# Patient Record
Sex: Male | Born: 1945
Health system: Southern US, Community
[De-identification: ages and names within clinical notes are randomized; demographics above are authoritative.]

## PROBLEM LIST (undated history)

## (undated) DIAGNOSIS — J189 Pneumonia, unspecified organism: Secondary | ICD-10-CM

## (undated) DIAGNOSIS — I10 Essential (primary) hypertension: Secondary | ICD-10-CM

## (undated) DIAGNOSIS — M1990 Unspecified osteoarthritis, unspecified site: Secondary | ICD-10-CM

## (undated) DIAGNOSIS — R112 Nausea with vomiting, unspecified: Secondary | ICD-10-CM

## (undated) DIAGNOSIS — E785 Hyperlipidemia, unspecified: Secondary | ICD-10-CM

## (undated) DIAGNOSIS — R42 Dizziness and giddiness: Secondary | ICD-10-CM

## (undated) DIAGNOSIS — T7840XA Allergy, unspecified, initial encounter: Secondary | ICD-10-CM

## (undated) DIAGNOSIS — K5790 Diverticulosis of intestine, part unspecified, without perforation or abscess without bleeding: Secondary | ICD-10-CM

## (undated) DIAGNOSIS — R011 Cardiac murmur, unspecified: Secondary | ICD-10-CM

## (undated) DIAGNOSIS — K219 Gastro-esophageal reflux disease without esophagitis: Secondary | ICD-10-CM

## (undated) DIAGNOSIS — Z8719 Personal history of other diseases of the digestive system: Secondary | ICD-10-CM

## (undated) DIAGNOSIS — K589 Irritable bowel syndrome without diarrhea: Secondary | ICD-10-CM

## (undated) DIAGNOSIS — I499 Cardiac arrhythmia, unspecified: Secondary | ICD-10-CM

## (undated) DIAGNOSIS — Z9889 Other specified postprocedural states: Secondary | ICD-10-CM

## (undated) DIAGNOSIS — H269 Unspecified cataract: Secondary | ICD-10-CM

## (undated) DIAGNOSIS — G4730 Sleep apnea, unspecified: Secondary | ICD-10-CM

## (undated) HISTORY — DX: Cardiac arrhythmia, unspecified: I49.9

## (undated) HISTORY — PX: CARPAL TUNNEL RELEASE: SHX101

## (undated) HISTORY — PX: UPPER GASTROINTESTINAL ENDOSCOPY: SHX188

## (undated) HISTORY — PX: EYE SURGERY: SHX253

## (undated) HISTORY — DX: Diverticulosis of intestine, part unspecified, without perforation or abscess without bleeding: K57.90

## (undated) HISTORY — DX: Essential (primary) hypertension: I10

## (undated) HISTORY — DX: Allergy, unspecified, initial encounter: T78.40XA

## (undated) HISTORY — PX: LUMBAR DISC SURGERY: SHX700

## (undated) HISTORY — PX: OTHER SURGICAL HISTORY: SHX169

## (undated) HISTORY — DX: Cardiac murmur, unspecified: R01.1

## (undated) HISTORY — DX: Unspecified cataract: H26.9

## (undated) HISTORY — PX: ESOPHAGOGASTRODUODENOSCOPY: SHX1529

## (undated) HISTORY — DX: Dizziness and giddiness: R42

## (undated) HISTORY — PX: JOINT REPLACEMENT: SHX530

## (undated) HISTORY — DX: Sleep apnea, unspecified: G47.30

## (undated) HISTORY — PX: COLONOSCOPY: SHX174

## (undated) HISTORY — DX: Irritable bowel syndrome, unspecified: K58.9

## (undated) HISTORY — DX: Hyperlipidemia, unspecified: E78.5

---

## 1978-05-17 HISTORY — PX: VASECTOMY: SHX75

## 1999-10-21 ENCOUNTER — Encounter: Payer: Self-pay | Admitting: Emergency Medicine

## 1999-10-21 ENCOUNTER — Emergency Department (HOSPITAL_COMMUNITY): Admission: EM | Admit: 1999-10-21 | Discharge: 1999-10-21 | Payer: Self-pay | Admitting: Emergency Medicine

## 2002-05-17 HISTORY — PX: CHOLECYSTECTOMY: SHX55

## 2004-05-17 HISTORY — PX: FRACTURE SURGERY: SHX138

## 2009-05-17 HISTORY — PX: SHOULDER SURGERY: SHX246

## 2012-01-28 DIAGNOSIS — M12269 Villonodular synovitis (pigmented), unspecified knee: Secondary | ICD-10-CM | POA: Diagnosis not present

## 2012-01-28 DIAGNOSIS — G5600 Carpal tunnel syndrome, unspecified upper limb: Secondary | ICD-10-CM | POA: Diagnosis not present

## 2012-01-28 DIAGNOSIS — M9420 Chondromalacia, unspecified site: Secondary | ICD-10-CM | POA: Diagnosis not present

## 2012-01-28 DIAGNOSIS — M7650 Patellar tendinitis, unspecified knee: Secondary | ICD-10-CM | POA: Diagnosis not present

## 2012-02-18 DIAGNOSIS — G5600 Carpal tunnel syndrome, unspecified upper limb: Secondary | ICD-10-CM | POA: Diagnosis not present

## 2012-04-04 DIAGNOSIS — Z23 Encounter for immunization: Secondary | ICD-10-CM | POA: Diagnosis not present

## 2012-04-04 DIAGNOSIS — K219 Gastro-esophageal reflux disease without esophagitis: Secondary | ICD-10-CM | POA: Diagnosis not present

## 2012-04-04 DIAGNOSIS — R1084 Generalized abdominal pain: Secondary | ICD-10-CM | POA: Diagnosis not present

## 2012-04-04 DIAGNOSIS — Z79899 Other long term (current) drug therapy: Secondary | ICD-10-CM | POA: Diagnosis not present

## 2012-04-04 DIAGNOSIS — E782 Mixed hyperlipidemia: Secondary | ICD-10-CM | POA: Diagnosis not present

## 2012-04-04 DIAGNOSIS — Z125 Encounter for screening for malignant neoplasm of prostate: Secondary | ICD-10-CM | POA: Diagnosis not present

## 2012-04-20 DIAGNOSIS — F411 Generalized anxiety disorder: Secondary | ICD-10-CM | POA: Diagnosis not present

## 2012-04-20 DIAGNOSIS — G5600 Carpal tunnel syndrome, unspecified upper limb: Secondary | ICD-10-CM | POA: Diagnosis not present

## 2012-04-20 DIAGNOSIS — G4730 Sleep apnea, unspecified: Secondary | ICD-10-CM | POA: Diagnosis not present

## 2012-04-20 DIAGNOSIS — R072 Precordial pain: Secondary | ICD-10-CM | POA: Diagnosis not present

## 2012-04-20 DIAGNOSIS — G4733 Obstructive sleep apnea (adult) (pediatric): Secondary | ICD-10-CM | POA: Diagnosis not present

## 2012-04-20 DIAGNOSIS — E669 Obesity, unspecified: Secondary | ICD-10-CM | POA: Diagnosis not present

## 2012-05-17 DIAGNOSIS — K449 Diaphragmatic hernia without obstruction or gangrene: Secondary | ICD-10-CM

## 2012-05-17 HISTORY — DX: Diaphragmatic hernia without obstruction or gangrene: K44.9

## 2012-05-26 DIAGNOSIS — R198 Other specified symptoms and signs involving the digestive system and abdomen: Secondary | ICD-10-CM | POA: Diagnosis not present

## 2012-05-26 DIAGNOSIS — K5900 Constipation, unspecified: Secondary | ICD-10-CM | POA: Diagnosis not present

## 2012-05-26 DIAGNOSIS — K219 Gastro-esophageal reflux disease without esophagitis: Secondary | ICD-10-CM | POA: Diagnosis not present

## 2012-05-26 DIAGNOSIS — R1032 Left lower quadrant pain: Secondary | ICD-10-CM | POA: Diagnosis not present

## 2012-05-26 DIAGNOSIS — R109 Unspecified abdominal pain: Secondary | ICD-10-CM | POA: Diagnosis not present

## 2012-06-12 DIAGNOSIS — K2100 Gastro-esophageal reflux disease with esophagitis, without bleeding: Secondary | ICD-10-CM | POA: Diagnosis not present

## 2012-06-12 DIAGNOSIS — R198 Other specified symptoms and signs involving the digestive system and abdomen: Secondary | ICD-10-CM | POA: Diagnosis not present

## 2012-06-12 DIAGNOSIS — K449 Diaphragmatic hernia without obstruction or gangrene: Secondary | ICD-10-CM | POA: Diagnosis not present

## 2012-06-12 DIAGNOSIS — R1032 Left lower quadrant pain: Secondary | ICD-10-CM | POA: Diagnosis not present

## 2012-06-12 DIAGNOSIS — K219 Gastro-esophageal reflux disease without esophagitis: Secondary | ICD-10-CM | POA: Diagnosis not present

## 2012-07-24 DIAGNOSIS — K2100 Gastro-esophageal reflux disease with esophagitis, without bleeding: Secondary | ICD-10-CM | POA: Diagnosis not present

## 2012-07-24 DIAGNOSIS — R198 Other specified symptoms and signs involving the digestive system and abdomen: Secondary | ICD-10-CM | POA: Diagnosis not present

## 2012-07-24 DIAGNOSIS — K589 Irritable bowel syndrome without diarrhea: Secondary | ICD-10-CM | POA: Diagnosis not present

## 2012-09-29 DIAGNOSIS — G5600 Carpal tunnel syndrome, unspecified upper limb: Secondary | ICD-10-CM | POA: Diagnosis not present

## 2012-10-02 DIAGNOSIS — R197 Diarrhea, unspecified: Secondary | ICD-10-CM | POA: Diagnosis not present

## 2012-10-02 DIAGNOSIS — E782 Mixed hyperlipidemia: Secondary | ICD-10-CM | POA: Diagnosis not present

## 2012-10-02 DIAGNOSIS — K219 Gastro-esophageal reflux disease without esophagitis: Secondary | ICD-10-CM | POA: Diagnosis not present

## 2012-10-02 DIAGNOSIS — Z79899 Other long term (current) drug therapy: Secondary | ICD-10-CM | POA: Diagnosis not present

## 2012-11-13 DIAGNOSIS — Z01810 Encounter for preprocedural cardiovascular examination: Secondary | ICD-10-CM | POA: Diagnosis not present

## 2012-11-13 DIAGNOSIS — G5600 Carpal tunnel syndrome, unspecified upper limb: Secondary | ICD-10-CM | POA: Diagnosis not present

## 2012-11-13 DIAGNOSIS — E785 Hyperlipidemia, unspecified: Secondary | ICD-10-CM | POA: Diagnosis not present

## 2012-11-13 DIAGNOSIS — Z87891 Personal history of nicotine dependence: Secondary | ICD-10-CM | POA: Diagnosis not present

## 2013-01-05 DIAGNOSIS — G4733 Obstructive sleep apnea (adult) (pediatric): Secondary | ICD-10-CM | POA: Diagnosis not present

## 2013-02-07 DIAGNOSIS — E782 Mixed hyperlipidemia: Secondary | ICD-10-CM | POA: Diagnosis not present

## 2013-02-07 DIAGNOSIS — K219 Gastro-esophageal reflux disease without esophagitis: Secondary | ICD-10-CM | POA: Diagnosis not present

## 2013-02-07 DIAGNOSIS — Z79899 Other long term (current) drug therapy: Secondary | ICD-10-CM | POA: Diagnosis not present

## 2013-02-07 DIAGNOSIS — R197 Diarrhea, unspecified: Secondary | ICD-10-CM | POA: Diagnosis not present

## 2013-04-11 ENCOUNTER — Encounter: Payer: Self-pay | Admitting: Family Medicine

## 2013-04-11 ENCOUNTER — Ambulatory Visit (INDEPENDENT_AMBULATORY_CARE_PROVIDER_SITE_OTHER): Payer: Medicare Other | Admitting: Family Medicine

## 2013-04-11 VITALS — BP 120/60 | HR 78 | Temp 97.6°F | Resp 18 | Ht 68.0 in | Wt 195.5 lb

## 2013-04-11 DIAGNOSIS — Z0000 Encounter for general adult medical examination without abnormal findings: Secondary | ICD-10-CM | POA: Diagnosis not present

## 2013-04-11 DIAGNOSIS — G8929 Other chronic pain: Secondary | ICD-10-CM

## 2013-04-11 DIAGNOSIS — M25511 Pain in right shoulder: Secondary | ICD-10-CM

## 2013-04-11 DIAGNOSIS — E781 Pure hyperglyceridemia: Secondary | ICD-10-CM | POA: Diagnosis not present

## 2013-04-11 DIAGNOSIS — G4733 Obstructive sleep apnea (adult) (pediatric): Secondary | ICD-10-CM

## 2013-04-11 DIAGNOSIS — K219 Gastro-esophageal reflux disease without esophagitis: Secondary | ICD-10-CM

## 2013-04-11 DIAGNOSIS — M25519 Pain in unspecified shoulder: Secondary | ICD-10-CM

## 2013-04-11 NOTE — Progress Notes (Signed)
Subjective:    Patient ID: Brett [name], male    DOB: [birth year], 67 y.o.   MRN: [id number]  HPI   Subjective:   Patient presents for Medicare Annual/Subsequent preventive examination.    Patient here to establish care and for physical exam. He recently moved to [facility] from [place]. He is here with his wife in some family members. He has no specific concerns today. His history and medications were reviewed.  Hyper lipidemia. Mostly elevated triglycerides. He's taken pravastatin which he's been on for the past 2 years. He refuses to change any other medications. His cholesterol was checked back in may of 2014 and labs have been scanned into the chart.   Obstructive sleep apnea he is currently on CPAP he has been on this for greater than 5 years   He did have an abnormal chest x-ray which showed a small spot on his lungs which is been followed with chest x-rays every couple of years. He quit tobacco back in 1989    He is on the Colestid pulled secondary to transient bile problems from his gallbladder which is now status post cholecystectomy. He does well on the current dose   He has history of chronic right shoulder pain he's been evaluated by orthopedics in the past. At this time he is maintained on low-dose diclofenac. It is noted that he does have GERD but he takes Prilosec   In the past he was on fluoxetine but this was accommodation with his gastroenterologist was not given secondary to mood disorder appear    He is establish will establish with an eye doctor and dentist. He was told that he had early cataracts appear  Review Past Medical/Family/Social: EMR   Risk Factors  Current exercise habits: Some exercise Dietary issues discussed: yes  Cardiac risk factors: Hyperlipidemia  Depression Screen  (Note: if answer to either of the following is "Yes", a more complete depression screening is indicated)  Over the past two weeks, have you felt down, depressed or hopeless?  No Over the past two weeks, have you felt little interest or pleasure in doing things? No Have you lost interest or pleasure in daily life? No Do you often feel hopeless? No Do you cry easily over simple problems? No   Activities of Daily Living  In your present state of health, do you have any difficulty performing the following activities?:  Driving? No  Managing money? No  Feeding yourself? No  Getting from bed to chair? No  Climbing a flight of stairs? No  Preparing food and eating?: No  Bathing or showering? No  Getting dressed: No  Getting to the toilet? No  Using the toilet:No  Moving around from place to place: No  In the past year have you fallen or had a near fall?:No  Are you sexually active? No  Do you have more than one partner? No   Hearing Difficulties: No  Do you often ask people to speak up or repeat themselves? No  Do you experience ringing or noises in your ears? No Do you have difficulty understanding soft or whispered voices? No  Do you feel that you have a problem with memory? No Do you often misplace items? No  Do you feel safe at home? Yes  Cognitive Testing  Alert? Yes Normal Appearance?Yes  Oriented to person? Yes Place? Yes  Time? Yes  Recall of three objects? Yes  Can perform simple calculations? Yes  Displays appropriate judgment?Yes  Can read the  correct time from a watch face?Yes     Screening Tests / Date Colonoscopy  - 06/12/12                   Zostavax - Done Influenza Vaccine - dECLLINES pNEUMOVAX- DECLINES Tetanus/tdap- UTD    Assessment:    Annual wellness medicare exam   Plan:    During the course of the visit the patient was educated and counseled about appropriate screening and preventive services including:  Pneumonia vaccine- declines, flu declines  Screen Neg for depression.   Diet review for nutrition referral? Yes ____ Not Indicated __x__  Patient Instructions (the written plan) was given to the patient.    Medicare Attestation  I have personally reviewed:  The patient's medical and social history  Their use of alcohol, tobacco or illicit drugs  Their current medications and supplements  The patient's functional ability including ADLs,fall risks, home safety risks, cognitive, and hearing and visual impairment  Diet and physical activities  Evidence for depression or mood disorders  The patient's weight, height, BMI, and visual acuity have been recorded in the chart. I have made referrals, counseling, and provided education to the patient based on review of the above and I have provided the patient with a written personalized care plan for preventive services.        Review of Systems  GEN- denies fatigue, fever, weight loss,weakness, recent illness HEENT- denies eye drainage, change in vision, nasal discharge, CVS- denies chest pain, palpitations RESP- denies SOB, cough, wheeze ABD- denies N/V, change in stools, abd pain GU- denies dysuria, hematuria, dribbling, incontinence MSK- + joint pain, muscle aches, injury Neuro- denies headache, dizziness, syncope, seizure activity      Objective:   Physical Exam GEN- NAD, alert and oriented x3 HEENT- PERRL, EOMI, non injected sclera, pink conjunctiva, MMM, oropharynx clear Neck- Supple, no thryomegaly CVS- RRR, no murmur RESP-CTAB ABD-NABS,soft,nt,nd EXT- No edema Pulses- Radial, DP- 2+ Psych- normal affect and mood       Assessment & Plan:

## 2013-04-11 NOTE — Patient Instructions (Signed)
Release of records - form PCP  Continue current medications F/U 6 months- fasting labs

## 2013-04-13 ENCOUNTER — Encounter: Payer: Self-pay | Admitting: Family Medicine

## 2013-04-13 DIAGNOSIS — G4733 Obstructive sleep apnea (adult) (pediatric): Secondary | ICD-10-CM | POA: Insufficient documentation

## 2013-04-13 DIAGNOSIS — E782 Mixed hyperlipidemia: Secondary | ICD-10-CM | POA: Insufficient documentation

## 2013-04-13 DIAGNOSIS — K219 Gastro-esophageal reflux disease without esophagitis: Secondary | ICD-10-CM | POA: Insufficient documentation

## 2013-04-13 DIAGNOSIS — G8929 Other chronic pain: Secondary | ICD-10-CM | POA: Insufficient documentation

## 2013-04-13 MED ORDER — DICLOFENAC SODIUM 75 MG PO TBEC: 75.0000 mg | DELAYED_RELEASE_TABLET | Freq: Two times a day (BID) | ORAL | Status: DC

## 2013-04-13 MED ORDER — PRAVASTATIN SODIUM 40 MG PO TABS: 40.0000 mg | ORAL_TABLET | Freq: Every day | ORAL | Status: DC

## 2013-04-13 MED ORDER — OMEPRAZOLE 20 MG PO CPDR: 20.0000 mg | DELAYED_RELEASE_CAPSULE | Freq: Every day | ORAL | Status: DC

## 2013-04-13 NOTE — Assessment & Plan Note (Signed)
He will continue his NSAIDs. We'll have to watch his renal function as he is on this chronically. He does have Gaster esophageal reflux however he wants to continue with the medication

## 2013-04-13 NOTE — Assessment & Plan Note (Signed)
Continue current medications he does not want to change his medication any further

## 2013-04-13 NOTE — Assessment & Plan Note (Signed)
Continue Prilosec

## 2013-05-23 ENCOUNTER — Encounter: Payer: Self-pay | Admitting: Physician Assistant

## 2013-05-23 ENCOUNTER — Ambulatory Visit (INDEPENDENT_AMBULATORY_CARE_PROVIDER_SITE_OTHER): Payer: Medicare Other | Admitting: Physician Assistant

## 2013-05-23 VITALS — BP 142/86 | HR 68 | Temp 97.5°F | Resp 18 | Wt 198.0 lb

## 2013-05-23 DIAGNOSIS — M779 Enthesopathy, unspecified: Secondary | ICD-10-CM | POA: Diagnosis not present

## 2013-05-23 MED ORDER — PREDNISONE 20 MG PO TABS: ORAL_TABLET | ORAL | Status: DC

## 2013-05-23 NOTE — Progress Notes (Signed)
Patient ID: Brett [name] MRN: [id number], DOB: 1946/04/24, 68 y.o. Date of Encounter: 05/23/2013, 2:55 PM    Chief Complaint:  Chief Complaint  Patient presents with  . rt wrist injury 2 weeks ago  still painful     HPI: 68 y.o. year old white male is here with his wife. 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 However, he does work on the equipment. He says that he has noticed pain at the ulnar aspect of his right wrist with certain movements over the past 2 weeks. It is when that area is stretched, that he feels the pain there. I.e. when he adducts his wrist.  Says he had no trauma or injury. Had no sudden onset of pain. Says that when he pulls the tailgate of his truck closed, this causes pain in this area.  He says that he takes anti-inflammatory twice a day on a routine basis secondary to problems with his neck. Says this is not helping  He has not rested the wrist at all. He has continued to do repetitive motion/ working on farm equipment on a routine basis.     Home Meds: See attached medication section for any medications that were entered at today's visit. The computer does not put those onto this list.The following list is a list of meds entered prior to today's visit.   Current Outpatient Prescriptions on File Prior to Visit  Medication Sig Dispense Refill  . colestipol (COLESTID) 1 G tablet Take 1 g by mouth 2 (two) times daily.      . diclofenac (VOLTAREN) 75 MG EC tablet Take 1 tablet (75 mg total) by mouth 2 (two) times daily.  180 tablet  3  . omeprazole (PRILOSEC) 20 MG capsule Take 1 capsule (20 mg total) by mouth daily.  90 capsule  3  . pravastatin (PRAVACHOL) 40 MG tablet Take 1 tablet (40 mg total) by mouth daily.  90 tablet  3   No current facility-administered medications on file prior to visit.    Allergies: No Known Allergies    Review of Systems: See HPI for pertinent ROS. All other ROS negative.     Physical Exam: Blood pressure 142/86, pulse 68, temperature 97.5 F (36.4 C), temperature source Oral, resp. rate 18, weight 198 lb (89.812 kg)., Body mass index is 30.11 kg/(m^2). General: WNWD WM. Appears in no acute distress. Lungs: Clear bilaterally to auscultation without wheezes, rales, or rhonchi. Breathing is unlabored. Heart: Regular rhythm. No murmurs, rubs, or gallops. Msk:  Strength and tone normal for age.  Right Wrist: Inspection is normal. There is no ecchymosis and no erythema. Very minimal swelling of the right hand compared to the left. No localized swelling at the wrist.  He has full range of motion of the wrist. When he adducts, this causes mild pain at the ulnar aspect/lateral side of the wrist. Also, when he fully flexes at the wrist, this also causes some pain at the ulnar lateral aspect of the wrist.  Neuro: Alert and oriented X 3. Moves all extremities spontaneously. Gait is normal. CNII-XII grossly in tact. Psych:  Responds to questions appropriately with a normal affect.     ASSESSMENT AND PLAN:  68 y.o. year old male with  1. Tendonitis Discussed that he needs to rest to the area. He does not currently have a wrist splint. I put a wrist splint on him today. Discussed the need to keep  the wrist in neutral position and to avoid repetitive motion for 2 weeks. Also give prednisone to decrease inflammation. If symptoms not improved/resolved in 2 weeks, then followup.  - predniSONE (DELTASONE) 20 MG tablet; Take 3 daily for 2 days, then 2 daily for 2 days, then 1 daily for 2 days.  Dispense: 12 tablet; Refill: 0   Signed, [address] [place], [place], [facility] 05/23/2013 2:55 PM

## 2013-08-24 ENCOUNTER — Encounter: Payer: Self-pay | Admitting: Family Medicine

## 2013-08-24 ENCOUNTER — Ambulatory Visit (INDEPENDENT_AMBULATORY_CARE_PROVIDER_SITE_OTHER): Payer: Medicare Other | Admitting: Family Medicine

## 2013-08-24 VITALS — BP 134/86 | HR 62 | Temp 97.3°F | Resp 14 | Ht 69.0 in | Wt 192.0 lb

## 2013-08-24 DIAGNOSIS — W57XXXA Bitten or stung by nonvenomous insect and other nonvenomous arthropods, initial encounter: Secondary | ICD-10-CM

## 2013-08-24 DIAGNOSIS — T148 Other injury of unspecified body region: Secondary | ICD-10-CM

## 2013-08-24 DIAGNOSIS — R7309 Other abnormal glucose: Secondary | ICD-10-CM

## 2013-08-24 DIAGNOSIS — E785 Hyperlipidemia, unspecified: Secondary | ICD-10-CM | POA: Diagnosis not present

## 2013-08-24 DIAGNOSIS — Z125 Encounter for screening for malignant neoplasm of prostate: Secondary | ICD-10-CM | POA: Diagnosis not present

## 2013-08-24 DIAGNOSIS — R7302 Impaired glucose tolerance (oral): Secondary | ICD-10-CM

## 2013-08-24 MED ORDER — DOXYCYCLINE HYCLATE 100 MG PO TABS: 100.0000 mg | ORAL_TABLET | Freq: Two times a day (BID) | ORAL | Status: DC

## 2013-08-24 NOTE — Progress Notes (Signed)
Patient ID: Brett [name], male   DOB: 30-Jul-1945, 68 y.o.   MRN: [id number]   Subjective:    Patient ID: Brett [name], male    DOB: [birth year], 68 y.o.   MRN: [id number]  Patient presents for Tick Bite  patient here with multiple tick bites. 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 He is white however concerned about one on his left lower flank which is been red and now hardened. He's not had any joint pain has not had any fever does not have any abnormal myalgias. He's not had any drainage from the spot. He did actually removed the tick.    Review Of Systems:  GEN- denies fatigue, fever, weight loss,weakness, recent illness HEENT- denies eye drainage, change in vision, nasal discharge, CVS- denies chest pain, palpitations RESP- denies SOB, cough, wheeze ABD- denies N/V, change in stools, abd pain MSK- denies joint pain, muscle aches, injury Neuro- denies headache, dizziness, syncope, seizure activity       Objective:    BP 134/86  Pulse 62  Temp(Src) 97.3 F (36.3 C) (Oral)  Resp 14  Ht 5\' 9"  (1.753 m)  Wt 192 lb (87.091 kg)  BMI 28.34 kg/m2 GEN- NAD, alert and oriented x3 Skin- Left flank- quarter size erythema, with clearing in the middle, +induration ,NT, no fluctuance, scab right thigh, left leg, small erythematous macule at previous tick bite         Assessment & Plan:      Problem List Items Addressed This Visit   None    Visit Diagnoses   Tick bite    -  Primary    Multiple tick bites however the one last week no has some induration and erythema which spreading. I will start him on doxycycline for the next 10 days. Discussed proper clothing and bug spray       Note: This dictation was prepared with Dragon dictation along with smaller phrase technology. Any transcriptional errors that result from this process are unintentional.

## 2013-08-24 NOTE — Patient Instructions (Signed)
Take antibiotics Call if you worsen

## 2013-10-05 ENCOUNTER — Other Ambulatory Visit: Payer: Medicare Other

## 2013-10-05 DIAGNOSIS — Z125 Encounter for screening for malignant neoplasm of prostate: Secondary | ICD-10-CM

## 2013-10-05 DIAGNOSIS — R7302 Impaired glucose tolerance (oral): Secondary | ICD-10-CM

## 2013-10-05 DIAGNOSIS — R7309 Other abnormal glucose: Secondary | ICD-10-CM | POA: Diagnosis not present

## 2013-10-05 DIAGNOSIS — E785 Hyperlipidemia, unspecified: Secondary | ICD-10-CM | POA: Diagnosis not present

## 2013-10-05 LAB — CBC WITH DIFFERENTIAL/PLATELET
Basophils Absolute: 0.1 10*3/uL (ref 0.0–0.1)
Basophils Relative: 1 % (ref 0–1)
Eosinophils Absolute: 0.4 10*3/uL (ref 0.0–0.7)
Eosinophils Relative: 6 % — ABNORMAL HIGH (ref 0–5)
HCT: 41.9 % (ref 39.0–52.0)
Hemoglobin: 14.2 g/dL (ref 13.0–17.0)
Lymphocytes Relative: 26 % (ref 12–46)
Lymphs Abs: 1.7 10*3/uL (ref 0.7–4.0)
MCH: 27.3 pg (ref 26.0–34.0)
MCHC: 33.9 g/dL (ref 30.0–36.0)
MCV: 80.6 fL (ref 78.0–100.0)
Monocytes Absolute: 0.5 10*3/uL (ref 0.1–1.0)
Monocytes Relative: 7 % (ref 3–12)
Neutro Abs: 4 10*3/uL (ref 1.7–7.7)
Neutrophils Relative %: 60 % (ref 43–77)
Platelets: 263 10*3/uL (ref 150–400)
RBC: 5.2 MIL/uL (ref 4.22–5.81)
RDW: 13.5 % (ref 11.5–15.5)
WBC: 6.6 10*3/uL (ref 4.0–10.5)

## 2013-10-05 LAB — COMPREHENSIVE METABOLIC PANEL
ALT: 16 U/L (ref 0–53)
AST: 15 U/L (ref 0–37)
Albumin: 4.3 g/dL (ref 3.5–5.2)
Alkaline Phosphatase: 47 U/L (ref 39–117)
BUN: 12 mg/dL (ref 6–23)
CO2: 29 mEq/L (ref 19–32)
Calcium: 9.2 mg/dL (ref 8.4–10.5)
Chloride: 103 mEq/L (ref 96–112)
Creat: 0.96 mg/dL (ref 0.50–1.35)
Glucose, Bld: 94 mg/dL (ref 70–99)
Potassium: 4.4 mEq/L (ref 3.5–5.3)
Sodium: 141 mEq/L (ref 135–145)
Total Bilirubin: 0.5 mg/dL (ref 0.2–1.2)
Total Protein: 6.4 g/dL (ref 6.0–8.3)

## 2013-10-05 LAB — HEMOGLOBIN A1C
Hgb A1c MFr Bld: 5.7 % — ABNORMAL HIGH (ref <5.7)
Mean Plasma Glucose: 117 mg/dL — ABNORMAL HIGH (ref <117)

## 2013-10-05 LAB — LIPID PANEL
Cholesterol: 158 mg/dL (ref 0–200)
HDL: 31 mg/dL — ABNORMAL LOW (ref >39)
LDL Cholesterol: 85 mg/dL (ref 0–99)
Total CHOL/HDL Ratio: 5.1 Ratio
Triglycerides: 208 mg/dL — ABNORMAL HIGH (ref <150)
VLDL: 42 mg/dL — ABNORMAL HIGH (ref 0–40)

## 2013-10-06 LAB — PSA, MEDICARE: PSA: 0.66 ng/mL (ref <=4.00)

## 2013-10-09 ENCOUNTER — Ambulatory Visit (INDEPENDENT_AMBULATORY_CARE_PROVIDER_SITE_OTHER): Payer: Medicare Other | Admitting: Family Medicine

## 2013-10-09 ENCOUNTER — Encounter: Payer: Self-pay | Admitting: Family Medicine

## 2013-10-09 VITALS — BP 160/88 | HR 64 | Temp 97.8°F | Resp 16 | Ht 67.5 in | Wt 192.0 lb

## 2013-10-09 DIAGNOSIS — R030 Elevated blood-pressure reading, without diagnosis of hypertension: Secondary | ICD-10-CM | POA: Insufficient documentation

## 2013-10-09 DIAGNOSIS — R7309 Other abnormal glucose: Secondary | ICD-10-CM | POA: Diagnosis not present

## 2013-10-09 DIAGNOSIS — E781 Pure hyperglyceridemia: Secondary | ICD-10-CM

## 2013-10-09 DIAGNOSIS — M25519 Pain in unspecified shoulder: Secondary | ICD-10-CM

## 2013-10-09 DIAGNOSIS — R7302 Impaired glucose tolerance (oral): Secondary | ICD-10-CM

## 2013-10-09 DIAGNOSIS — G8929 Other chronic pain: Secondary | ICD-10-CM

## 2013-10-09 MED ORDER — NAPROXEN 500 MG PO TABS: 500.0000 mg | ORAL_TABLET | Freq: Two times a day (BID) | ORAL | Status: DC

## 2013-10-09 NOTE — Progress Notes (Signed)
Patient ID: Brett [name], male   DOB: 02-Jul-1945, 68 y.o.   MRN: [id number]   Subjective:    Patient ID: Brett [name], male    DOB: 11/01/1945, 68 y.o.   MRN: [id number]  Patient presents for 6 month F/U  patient here to followup chronic medical problems. He has been on a lot of stress abuse taking care of his father who is 78 who is been out of the hospital. He is also known a lot more yard work and typical and is having more flares of his chronic shoulder pain. He does not want orthopedic intervention at this time he started have rotator cuff surgery done on his right shoulder. He wants to try different anti-inflammatory to see if that would help his he's been on diclofenac for some time. His fasting labs were reviewed with him.    Review Of Systems:  GEN- denies fatigue, fever, weight loss,weakness, recent illness HEENT- denies eye drainage, change in vision, nasal discharge, CVS- denies chest pain, palpitations RESP- denies SOB, cough, wheeze ABD- denies N/V, change in stools, abd pain GU- denies dysuria, hematuria, dribbling, incontinence MSK- + joint pain, muscle aches, injury Neuro- denies headache, dizziness, syncope, seizure activity       Objective:    BP 160/88  Pulse 64  Temp(Src) 97.8 F (36.6 C) (Oral)  Resp 16  Ht 5' 7.5" (1.715 m)  Wt 192 lb (87.091 kg)  BMI 29.61 kg/m2 GEN- NAD, alert and oriented x3 HEENT- PERRL, EOMI, non injected sclera, pink conjunctiva, MMM, oropharynx clear CVS- RRR, no murmur RESP-CTAB MSK- decreased ROM Right shoulder compared, to left, mild crepitus, normal inspection EXT- No edema Pulses- Radial 2+        Assessment & Plan:      Problem List Items Addressed This Visit   None      Note: This dictation was prepared with Dragon dictation along with smaller phrase technology. Any transcriptional errors that result from this process are unintentional.

## 2013-10-09 NOTE — Patient Instructions (Signed)
Continue current medications Try the naprosyn for your shoulder  F/U 6 months

## 2013-10-09 NOTE — Assessment & Plan Note (Signed)
A1C much improved

## 2013-10-09 NOTE — Assessment & Plan Note (Signed)
With an acute flare. 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 He does not want to go with orthopedics at this time therefore we will hold off. At change of the Naprosyn twice a day with food to switch of his anti-inflammatory. He declines anything stronger. Also discussed he did try bio freeze. If his shoulder does not improve I did suggest to him seeing Dr. Mardelle Matte at [facility]

## 2013-10-09 NOTE — Assessment & Plan Note (Signed)
TG a little elevated, but this is about the best he has been in many years, no changes to meds, continue low fat diet, weight loss

## 2013-10-09 NOTE — Assessment & Plan Note (Signed)
Monitor BP at home, no meds currently

## 2014-03-02 ENCOUNTER — Other Ambulatory Visit: Payer: Self-pay | Admitting: Family Medicine

## 2014-03-04 NOTE — Telephone Encounter (Signed)
Refill appropriate and filled per protocol. 

## 2014-03-08 ENCOUNTER — Telehealth: Payer: Self-pay | Admitting: Family Medicine

## 2014-03-08 MED ORDER — COLESTIPOL HCL 1 G PO TABS: 1.0000 g | ORAL_TABLET | Freq: Every day | ORAL | Status: DC

## 2014-03-08 NOTE — Telephone Encounter (Signed)
Prescription sent to pharmacy.

## 2014-03-08 NOTE — Telephone Encounter (Signed)
Patient is calling to get a refill on colestipol. Call back number is   [phone]                  [facility] [place], [place]

## 2014-03-19 ENCOUNTER — Other Ambulatory Visit: Payer: Self-pay | Admitting: Family Medicine

## 2014-03-19 NOTE — Telephone Encounter (Signed)
Medication refilled per protocol. 

## 2014-04-02 ENCOUNTER — Other Ambulatory Visit: Payer: Medicare Other

## 2014-04-02 DIAGNOSIS — Z79899 Other long term (current) drug therapy: Secondary | ICD-10-CM | POA: Diagnosis not present

## 2014-04-02 DIAGNOSIS — E781 Pure hyperglyceridemia: Secondary | ICD-10-CM | POA: Diagnosis not present

## 2014-04-02 DIAGNOSIS — R7302 Impaired glucose tolerance (oral): Secondary | ICD-10-CM | POA: Diagnosis not present

## 2014-04-02 DIAGNOSIS — R030 Elevated blood-pressure reading, without diagnosis of hypertension: Secondary | ICD-10-CM

## 2014-04-02 DIAGNOSIS — R7309 Other abnormal glucose: Secondary | ICD-10-CM

## 2014-04-02 LAB — COMPLETE METABOLIC PANEL WITH GFR
ALT: 19 U/L (ref 0–53)
AST: 17 U/L (ref 0–37)
Albumin: 4 g/dL (ref 3.5–5.2)
Alkaline Phosphatase: 42 U/L (ref 39–117)
BUN: 13 mg/dL (ref 6–23)
CO2: 30 mEq/L (ref 19–32)
Calcium: 8.7 mg/dL (ref 8.4–10.5)
Chloride: 103 mEq/L (ref 96–112)
Creat: 0.97 mg/dL (ref 0.50–1.35)
GFR, Est African American: >89 mL/min
GFR, Est Non African American: 80 mL/min
Glucose, Bld: 103 mg/dL — ABNORMAL HIGH (ref 70–99)
Potassium: 4.5 mEq/L (ref 3.5–5.3)
Sodium: 140 mEq/L (ref 135–145)
Total Bilirubin: 0.5 mg/dL (ref 0.2–1.2)
Total Protein: 6.4 g/dL (ref 6.0–8.3)

## 2014-04-02 LAB — CBC WITH DIFFERENTIAL/PLATELET
Basophils Absolute: 0.1 10*3/uL (ref 0.0–0.1)
Basophils Relative: 1 % (ref 0–1)
Eosinophils Absolute: 0.3 10*3/uL (ref 0.0–0.7)
Eosinophils Relative: 5 % (ref 0–5)
HCT: 42.3 % (ref 39.0–52.0)
Hemoglobin: 14 g/dL (ref 13.0–17.0)
Lymphocytes Relative: 26 % (ref 12–46)
Lymphs Abs: 1.6 10*3/uL (ref 0.7–4.0)
MCH: 27.5 pg (ref 26.0–34.0)
MCHC: 33.1 g/dL (ref 30.0–36.0)
MCV: 82.9 fL (ref 78.0–100.0)
MPV: 9.2 fL — ABNORMAL LOW (ref 9.4–12.4)
Monocytes Absolute: 0.5 10*3/uL (ref 0.1–1.0)
Monocytes Relative: 8 % (ref 3–12)
Neutro Abs: 3.6 10*3/uL (ref 1.7–7.7)
Neutrophils Relative %: 60 % (ref 43–77)
Platelets: 258 10*3/uL (ref 150–400)
RBC: 5.1 MIL/uL (ref 4.22–5.81)
RDW: 13.4 % (ref 11.5–15.5)
WBC: 6 10*3/uL (ref 4.0–10.5)

## 2014-04-02 LAB — LIPID PANEL
Cholesterol: 166 mg/dL (ref 0–200)
HDL: 38 mg/dL — ABNORMAL LOW (ref >39)
LDL Cholesterol: 96 mg/dL (ref 0–99)
Total CHOL/HDL Ratio: 4.4 Ratio
Triglycerides: 162 mg/dL — ABNORMAL HIGH (ref <150)
VLDL: 32 mg/dL (ref 0–40)

## 2014-04-02 LAB — HEMOGLOBIN A1C
Hgb A1c MFr Bld: 5.6 % (ref <5.7)
Mean Plasma Glucose: 114 mg/dL (ref <117)

## 2014-04-03 ENCOUNTER — Other Ambulatory Visit: Payer: Self-pay | Admitting: Family Medicine

## 2014-04-10 ENCOUNTER — Encounter: Payer: Self-pay | Admitting: Family Medicine

## 2014-04-10 ENCOUNTER — Ambulatory Visit (INDEPENDENT_AMBULATORY_CARE_PROVIDER_SITE_OTHER): Payer: Medicare Other | Admitting: Family Medicine

## 2014-04-10 VITALS — BP 152/80 | HR 62 | Temp 98.4°F | Resp 14 | Ht 68.0 in | Wt 196.0 lb

## 2014-04-10 DIAGNOSIS — E781 Pure hyperglyceridemia: Secondary | ICD-10-CM

## 2014-04-10 DIAGNOSIS — I10 Essential (primary) hypertension: Secondary | ICD-10-CM | POA: Diagnosis not present

## 2014-04-10 DIAGNOSIS — R7302 Impaired glucose tolerance (oral): Secondary | ICD-10-CM

## 2014-04-10 DIAGNOSIS — M25511 Pain in right shoulder: Secondary | ICD-10-CM

## 2014-04-10 DIAGNOSIS — D229 Melanocytic nevi, unspecified: Secondary | ICD-10-CM | POA: Diagnosis not present

## 2014-04-10 DIAGNOSIS — M5030 Other cervical disc degeneration, unspecified cervical region: Secondary | ICD-10-CM | POA: Diagnosis not present

## 2014-04-10 DIAGNOSIS — M19011 Primary osteoarthritis, right shoulder: Secondary | ICD-10-CM

## 2014-04-10 DIAGNOSIS — F5102 Adjustment insomnia: Secondary | ICD-10-CM | POA: Diagnosis not present

## 2014-04-10 DIAGNOSIS — G8929 Other chronic pain: Secondary | ICD-10-CM

## 2014-04-10 MED ORDER — AMLODIPINE BESYLATE 10 MG PO TABS: 10.0000 mg | ORAL_TABLET | Freq: Every day | ORAL | Status: DC

## 2014-04-10 MED ORDER — ZOLPIDEM TARTRATE 10 MG PO TABS: 10.0000 mg | ORAL_TABLET | Freq: Every evening | ORAL | Status: DC | PRN

## 2014-04-10 NOTE — Progress Notes (Signed)
Patient ID: Brett [name], male   DOB: November 07, 1945, 68 y.o.   MRN: [id number]   Subjective:    Patient ID: Brett [name], male    DOB: Sep 10, 1945, 68 y.o.   MRN: [id number]  Patient presents for 6 month F/U  Patient here to follow-up chronic medical problems. He has a couple of complaints. He continues to have difficulty with his right shoulder he has had if these with rotator cuff problems in the past. He also has had a pinched nerve in his neck which she has noticed more shooting pains down his right side. He is taking diclofenac anti-inflammatory which helps some.  He hasn't HE spot over mole on his lower back he would like me to look at. He also has a small area on his left leg that itches but there is no rash and no mole in that area.  Insomnia he's been on a lot of stress and has had insomnia the past couple months since his father die. He will like to try something temporarily to help him sleep. He is unable to shut his mind off at nighttime. He denies feeling depressed.  Fasting labs reviewed and medications Review Of Systems:  GEN- denies fatigue, fever, weight loss,weakness, recent illness HEENT- denies eye drainage, change in vision, nasal discharge, CVS- denies chest pain, palpitations RESP- denies SOB, cough, wheeze ABD- denies N/V, change in stools, abd pain GU- denies dysuria, hematuria, dribbling, incontinence MSK- + joint pain, muscle aches, injury Neuro- denies headache, dizziness, syncope, seizure activity       Objective:    BP 152/80 mmHg  Pulse 62  Temp(Src) 98.4 F (36.9 C) (Oral)  Resp 14  Ht 5\' 8"  (1.727 m)  Wt 196 lb (88.905 kg)  BMI 29.81 kg/m2 GEN- NAD, alert and oriented x3 , repeat BP 160/80 HEENT- PERRL, EOMI, non injected sclera, pink conjunctiva, MMM, oropharynx clear Neck- Supple, no thyromegaly , fair ROM in neck, spine NT CVS- RRR, no murmur RESP-CTAB MSK- bilat inspection UE normal, decreased ROM right shoulder, +epmty can, biceps/triceps in  tact, neg impingment signs EXT- No edema Psych- normal affect and mood Pulses- Radial 2+ Skin- multiple mole and skin tags, 1 tannish mole at site of itching right lower back, no lesions on legs       Assessment & Plan:      Problem List Items Addressed This Visit    None      Note: This dictation was prepared with Dragon dictation along with smaller phrase technology. Any transcriptional errors that result from this process are unintentional.

## 2014-04-10 NOTE — Assessment & Plan Note (Signed)
A1C looks great

## 2014-04-10 NOTE — Assessment & Plan Note (Signed)
Start norvasc 10mg  once a day Fasting labs reviewed, TG improved significantly Normal renal function

## 2014-04-10 NOTE — Patient Instructions (Signed)
Referral to dermatology Referral to orthopedics- Dr. Mardelle Matte Start norvasc 10mg  once a day  Try Ambien for Sleep F/U 6 months

## 2014-04-10 NOTE — Assessment & Plan Note (Signed)
Discussed meds, trial of ambien, short term

## 2014-04-10 NOTE — Assessment & Plan Note (Addendum)
DJD in shoulder, decreased ROM, history of surgery in past, CT from 2009 shows DDD in neck as well Continue diclofenac BID Refer to ortho for evaluation Very active on his farm

## 2014-04-17 ENCOUNTER — Telehealth: Payer: Self-pay | Admitting: Family Medicine

## 2014-04-17 ENCOUNTER — Other Ambulatory Visit: Payer: Self-pay | Admitting: Family Medicine

## 2014-04-17 DIAGNOSIS — M25511 Pain in right shoulder: Secondary | ICD-10-CM | POA: Diagnosis not present

## 2014-04-17 MED ORDER — SUVOREXANT 10 MG PO TABS: 1.0000 | ORAL_TABLET | Freq: Every evening | ORAL | Status: DC | PRN

## 2014-04-17 NOTE — Telephone Encounter (Signed)
The pravastatin should not cause muscle aches in his arms, but he can stop for about 2 weeks and see if it make any difference  He can come pick up a script for sample of Belsomra it is free for a trial-

## 2014-04-17 NOTE — Telephone Encounter (Signed)
Patient is calling to ask you a question about the weakness in his arms and if maybe some of his medication could be causing it  [phone]

## 2014-04-17 NOTE — Telephone Encounter (Signed)
Returned call to patient.   States that he has some generalized muscle aches to upper arms. Reports that he spoke with MD about aches, but thought it had more to do with pinched nerve in neck.   Patient states that he now thinks that this may have to do with Pravastatin.   Requested MD to advise.   Also reports that MD prescribed Ambien for insomnia. States that he is taking difficulty staying asleep. Reports that he continues to wake around 3-4am every morning.   MD please advise.

## 2014-04-18 NOTE — Telephone Encounter (Signed)
Call placed to patient. LMTRC.  

## 2014-04-18 NOTE — Telephone Encounter (Signed)
Patient returned call and made aware.

## 2014-05-08 DIAGNOSIS — R208 Other disturbances of skin sensation: Secondary | ICD-10-CM | POA: Diagnosis not present

## 2014-05-08 DIAGNOSIS — D239 Other benign neoplasm of skin, unspecified: Secondary | ICD-10-CM | POA: Diagnosis not present

## 2014-06-15 DIAGNOSIS — J029 Acute pharyngitis, unspecified: Secondary | ICD-10-CM | POA: Diagnosis not present

## 2014-06-15 DIAGNOSIS — J22 Unspecified acute lower respiratory infection: Secondary | ICD-10-CM | POA: Diagnosis not present

## 2014-06-15 DIAGNOSIS — R05 Cough: Secondary | ICD-10-CM | POA: Diagnosis not present

## 2014-08-27 ENCOUNTER — Other Ambulatory Visit: Payer: Self-pay | Admitting: Family Medicine

## 2014-08-27 NOTE — Telephone Encounter (Signed)
Refill appropriate and filled per protocol. 

## 2014-09-23 DIAGNOSIS — M25511 Pain in right shoulder: Secondary | ICD-10-CM | POA: Diagnosis not present

## 2014-09-23 DIAGNOSIS — M19011 Primary osteoarthritis, right shoulder: Secondary | ICD-10-CM | POA: Diagnosis not present

## 2014-10-09 ENCOUNTER — Ambulatory Visit (INDEPENDENT_AMBULATORY_CARE_PROVIDER_SITE_OTHER): Payer: Medicare Other | Admitting: Family Medicine

## 2014-10-09 ENCOUNTER — Encounter: Payer: Self-pay | Admitting: Family Medicine

## 2014-10-09 VITALS — BP 128/66 | HR 78 | Temp 98.6°F | Resp 14 | Ht 68.0 in | Wt 191.0 lb

## 2014-10-09 DIAGNOSIS — F5102 Adjustment insomnia: Secondary | ICD-10-CM

## 2014-10-09 DIAGNOSIS — E781 Pure hyperglyceridemia: Secondary | ICD-10-CM

## 2014-10-09 DIAGNOSIS — Z23 Encounter for immunization: Secondary | ICD-10-CM

## 2014-10-09 DIAGNOSIS — I10 Essential (primary) hypertension: Secondary | ICD-10-CM | POA: Diagnosis not present

## 2014-10-09 DIAGNOSIS — Z9989 Dependence on other enabling machines and devices: Secondary | ICD-10-CM

## 2014-10-09 DIAGNOSIS — R7302 Impaired glucose tolerance (oral): Secondary | ICD-10-CM | POA: Diagnosis not present

## 2014-10-09 DIAGNOSIS — G4733 Obstructive sleep apnea (adult) (pediatric): Secondary | ICD-10-CM

## 2014-10-09 LAB — CBC WITH DIFFERENTIAL/PLATELET
Basophils Absolute: 0.1 10*3/uL (ref 0.0–0.1)
Basophils Relative: 1 % (ref 0–1)
Eosinophils Absolute: 0.3 10*3/uL (ref 0.0–0.7)
Eosinophils Relative: 3 % (ref 0–5)
HCT: 43.6 % (ref 39.0–52.0)
Hemoglobin: 14.7 g/dL (ref 13.0–17.0)
Lymphocytes Relative: 19 % (ref 12–46)
Lymphs Abs: 1.7 10*3/uL (ref 0.7–4.0)
MCH: 27.8 pg (ref 26.0–34.0)
MCHC: 33.7 g/dL (ref 30.0–36.0)
MCV: 82.4 fL (ref 78.0–100.0)
MPV: 9.1 fL (ref 8.6–12.4)
Monocytes Absolute: 0.5 10*3/uL (ref 0.1–1.0)
Monocytes Relative: 6 % (ref 3–12)
Neutro Abs: 6.2 10*3/uL (ref 1.7–7.7)
Neutrophils Relative %: 71 % (ref 43–77)
Platelets: 279 10*3/uL (ref 150–400)
RBC: 5.29 MIL/uL (ref 4.22–5.81)
RDW: 14.3 % (ref 11.5–15.5)
WBC: 8.8 10*3/uL (ref 4.0–10.5)

## 2014-10-09 LAB — HEMOGLOBIN A1C
Hgb A1c MFr Bld: 6.1 % — ABNORMAL HIGH (ref <5.7)
Mean Plasma Glucose: 128 mg/dL — ABNORMAL HIGH (ref <117)

## 2014-10-09 LAB — COMPREHENSIVE METABOLIC PANEL
ALT: 26 U/L (ref 0–53)
AST: 16 U/L (ref 0–37)
Albumin: 4.2 g/dL (ref 3.5–5.2)
Alkaline Phosphatase: 57 U/L (ref 39–117)
BUN: 13 mg/dL (ref 6–23)
CO2: 29 mEq/L (ref 19–32)
Calcium: 9.4 mg/dL (ref 8.4–10.5)
Chloride: 100 mEq/L (ref 96–112)
Creat: 0.9 mg/dL (ref 0.50–1.35)
Glucose, Bld: 103 mg/dL — ABNORMAL HIGH (ref 70–99)
Potassium: 4.2 mEq/L (ref 3.5–5.3)
Sodium: 136 mEq/L (ref 135–145)
Total Bilirubin: 0.6 mg/dL (ref 0.2–1.2)
Total Protein: 7.1 g/dL (ref 6.0–8.3)

## 2014-10-09 LAB — LIPID PANEL
Cholesterol: 254 mg/dL — ABNORMAL HIGH (ref 0–200)
HDL: 41 mg/dL (ref >=40)
LDL Cholesterol: 183 mg/dL — ABNORMAL HIGH (ref 0–99)
Total CHOL/HDL Ratio: 6.2 Ratio
Triglycerides: 149 mg/dL (ref <150)
VLDL: 30 mg/dL (ref 0–40)

## 2014-10-09 MED ORDER — OMEPRAZOLE 20 MG PO CPDR: 20.0000 mg | DELAYED_RELEASE_CAPSULE | Freq: Every day | ORAL | Status: DC

## 2014-10-09 MED ORDER — ZOLPIDEM TARTRATE 10 MG PO TABS: 10.0000 mg | ORAL_TABLET | Freq: Every evening | ORAL | Status: DC | PRN

## 2014-10-09 MED ORDER — AMLODIPINE BESYLATE 10 MG PO TABS: 10.0000 mg | ORAL_TABLET | Freq: Every day | ORAL | Status: DC

## 2014-10-09 MED ORDER — DICLOFENAC SODIUM 75 MG PO TBEC: 75.0000 mg | DELAYED_RELEASE_TABLET | Freq: Two times a day (BID) | ORAL | Status: DC

## 2014-10-09 NOTE — Assessment & Plan Note (Signed)
Continue Ambien as prescribed

## 2014-10-09 NOTE — Patient Instructions (Signed)
Continue current meds Prevnar 13 given  We will call with lab results F/U 6 months for PHYSICAL

## 2014-10-09 NOTE — Assessment & Plan Note (Signed)
He has lost 5 pounds since her last visit. Continue to watch his diet low-fat. He did not tolerate the statin will see how he his levels look today

## 2014-10-09 NOTE — Addendum Note (Signed)
Addended by: Sheral Flow on: 10/09/2014 10:55 AM   Modules accepted: Orders

## 2014-10-09 NOTE — Progress Notes (Signed)
Patient ID: Brett [name], male   DOB: [birth year], 69 y.o.   MRN: [id number]   Subjective:    Patient ID: Brett [name], male    DOB: [birth year], 69 y.o.   MRN: [id number]  Patient presents for 6 month F/U  Pt here to f/u chronic medical problems. No concerns today. Seen by Raliegh Ip 2 weeks ago, had steroid shot done into shoulder, also takes diclofenac as needed.  Unable to tolerate pravastatin, taking Colestipol, trying to watch diet.  HTN- tolerating BP meds without difficulty  Insomnia- uses Ambein as needed    Request prevnar 13    Review Of Systems:  GEN- denies fatigue, fever, weight loss,weakness, recent illness HEENT- denies eye drainage, change in vision, nasal discharge, CVS- denies chest pain, palpitations RESP- denies SOB, cough, wheeze ABD- denies N/V, change in stools, abd pain GU- denies dysuria, hematuria, dribbling, incontinence MSK-+ joint pain, muscle aches, injury Neuro- denies headache, dizziness, syncope, seizure activity       Objective:    BP 128/66 mmHg  Pulse 78  Temp(Src) 98.6 F (37 C) (Oral)  Resp 14  Ht 5\' 8"  (1.727 m)  Wt 191 lb (86.637 kg)  BMI 29.05 kg/m2 GEN- NAD, alert and oriented x3,weight down 5lbs HEENT- PERRL, EOMI, non injected sclera, pink conjunctiva, MMM, oropharynx clear CVS- RRR, no murmur RESP-CTA EXT- No edema Pulses- Radial, 2+        Assessment & Plan:      Problem List Items Addressed This Visit    None      Note: This dictation was prepared with Dragon dictation along with smaller phrase technology. Any transcriptional errors that result from this process are unintentional.

## 2014-10-09 NOTE — Assessment & Plan Note (Signed)
Blood pressure is well-controlled attention medication

## 2014-10-11 DIAGNOSIS — M25571 Pain in right ankle and joints of right foot: Secondary | ICD-10-CM | POA: Diagnosis not present

## 2014-10-23 DIAGNOSIS — M25551 Pain in right hip: Secondary | ICD-10-CM | POA: Diagnosis not present

## 2014-11-11 ENCOUNTER — Encounter: Payer: Self-pay | Admitting: Family Medicine

## 2014-11-11 ENCOUNTER — Ambulatory Visit (INDEPENDENT_AMBULATORY_CARE_PROVIDER_SITE_OTHER): Payer: Medicare Other | Admitting: Family Medicine

## 2014-11-11 VITALS — BP 128/70 | HR 72 | Temp 97.8°F | Resp 14 | Ht 68.0 in | Wt 190.0 lb

## 2014-11-11 DIAGNOSIS — M2550 Pain in unspecified joint: Secondary | ICD-10-CM

## 2014-11-11 DIAGNOSIS — W57XXXA Bitten or stung by nonvenomous insect and other nonvenomous arthropods, initial encounter: Secondary | ICD-10-CM | POA: Diagnosis not present

## 2014-11-11 DIAGNOSIS — T148 Other injury of unspecified body region: Secondary | ICD-10-CM

## 2014-11-11 DIAGNOSIS — M1611 Unilateral primary osteoarthritis, right hip: Secondary | ICD-10-CM | POA: Diagnosis not present

## 2014-11-11 LAB — CBC WITH DIFFERENTIAL/PLATELET
Basophils Absolute: 0.1 10*3/uL (ref 0.0–0.1)
Basophils Relative: 1 % (ref 0–1)
Eosinophils Absolute: 0.5 10*3/uL (ref 0.0–0.7)
Eosinophils Relative: 6 % — ABNORMAL HIGH (ref 0–5)
HCT: 41.6 % (ref 39.0–52.0)
Hemoglobin: 14.1 g/dL (ref 13.0–17.0)
Lymphocytes Relative: 24 % (ref 12–46)
Lymphs Abs: 1.9 10*3/uL (ref 0.7–4.0)
MCH: 27.6 pg (ref 26.0–34.0)
MCHC: 33.9 g/dL (ref 30.0–36.0)
MCV: 81.4 fL (ref 78.0–100.0)
MPV: 8.6 fL (ref 8.6–12.4)
Monocytes Absolute: 0.5 10*3/uL (ref 0.1–1.0)
Monocytes Relative: 6 % (ref 3–12)
Neutro Abs: 5.1 10*3/uL (ref 1.7–7.7)
Neutrophils Relative %: 63 % (ref 43–77)
Platelets: 315 10*3/uL (ref 150–400)
RBC: 5.11 MIL/uL (ref 4.22–5.81)
RDW: 14.1 % (ref 11.5–15.5)
WBC: 8.1 10*3/uL (ref 4.0–10.5)

## 2014-11-11 MED ORDER — METHYLPREDNISOLONE 4 MG PO TBPK: ORAL_TABLET | ORAL | Status: DC

## 2014-11-11 MED ORDER — DOXYCYCLINE HYCLATE 100 MG PO TABS: 100.0000 mg | ORAL_TABLET | Freq: Two times a day (BID) | ORAL | Status: DC

## 2014-11-11 NOTE — Progress Notes (Signed)
Patient ID: Brett [name], male   DOB: Jan 11, 1946, 69 y.o.   MRN: [id number]   Subjective:    Patient ID: Brett [name], male    DOB: 12-Aug-1945, 69 y.o.   MRN: [id number]  Patient presents for SP Fall and Tick Bite  Pt here s/p multiple tick bites and now with generalized joint pain. He was bitten by deer tick on 5-23- 16- he pulled off scrotum, had a few ticks off hip and most recently tic removed on 11/06/14. He noticed his shoulder, knees, ankles all more sore than usual and stiff, no joint swelling. Not sure if he had a rash. During this same time he did however have a fall off a ladder, he was cleaning out some gutters and slipped on a ladder, seen by Ortho (self referred) xray on ankle negative 10/10/14, put in ASO brace for sprain for 10 days, began having severe right hip pain, MRI of hip done, he did not hear results back from this, assumed it was normal. Hip pain has not improved pain with ROM, sitting or standing too long. States he knows he has OA. 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    Review Of Systems:  GEN- denies fatigue, fever, weight loss,weakness, recent illness HEENT- denies eye drainage, change in vision, nasal discharge, CVS- denies chest pain, palpitations RESP- denies SOB, cough, wheeze ABD- denies N/V, change in stools, abd pain GU- denies dysuria, hematuria, dribbling, incontinence MSK-+ joint pain, muscle aches, injury Neuro- denies headache, dizziness, syncope, seizure activity       Objective:    BP 128/70 mmHg  Pulse 72  Temp(Src) 97.8 F (36.6 C) (Oral)  Resp 14  Ht 5\' 8"  (1.727 m)  Wt 190 lb (86.183 kg)  BMI 28.90 kg/m2 GEN- NAD, alert and oriented x3 Neck- good ROM, supple, no LAD MSK- Spine NT, fair ROM spine, decreased ROM right hip, compared to left, pain with IR/ER, fair ROM bilat knees, ankles- good ROM, neg SLR Ext- no swelling Skin- in tact, no rash noted Pulse- 2+        Assessment & Plan:      Problem List Items Addressed This  Visit    None    Visit Diagnoses    Tick bite    -  Primary    Multiple tick bites and increased multi-joint pain, obtain Lyme titers, start doxycycline, medrol dose pak, discussed sun sensitivity. If negative will d/c antibiotic, discussed this with pt    Relevant Orders    CBC with Differential/Platelet    B. Burgdorfi Antibodies    Primary osteoarthritis of right hip        Medol dosepak, obtain MRI from orthopedics, stop NSAIDS while on Prednisone, also discussed only taking one NSAID at a time, to decrease risk of GI upset, GI bleed    Relevant Medications    methylPREDNISolone (MEDROL DOSEPAK) 4 MG TBPK tablet    Joint pain    - DD related to ticks, or due to recent fall and just inflammation , has OA in other joints       Note: This dictation was prepared with Dragon dictation along with smaller phrase technology. Any transcriptional errors that result from this process are unintentional.

## 2014-11-11 NOTE — Patient Instructions (Signed)
Take antibiotic as prescribed Start prednisone for hip Release of records- Dr. Emiliano Dyer- ortho  F/U as previous

## 2014-11-12 LAB — B. BURGDORFI ANTIBODIES: B burgdorferi Ab IgG+IgM: 0.26 {ISR}

## 2014-11-22 ENCOUNTER — Telehealth: Payer: Self-pay | Admitting: *Deleted

## 2014-11-22 NOTE — Telephone Encounter (Signed)
Call placed to patient and patient made aware. Verbalized understanding.  

## 2014-11-22 NOTE — Telephone Encounter (Signed)
-----   Message from Brett Rossetti, MD sent at 11/22/2014  1:54 PM EDT ----- Regarding: HIP MRI Call pt and put into phone note   MRI from Dr. Simonne Maffucci advanced OA of his right hip, he also had bursitis and degenrative tearing of the labrum in his hip, left hip also has significant arthritis I recommend f/u with orthopedics for his HIP pain especially if the steroids don't help

## 2014-11-25 ENCOUNTER — Other Ambulatory Visit: Payer: Self-pay | Admitting: Family Medicine

## 2014-11-25 MED ORDER — COLESTIPOL HCL 1 G PO TABS: 1.0000 g | ORAL_TABLET | Freq: Every day | ORAL | Status: DC

## 2014-11-25 NOTE — Telephone Encounter (Signed)
Medication refilled per protocol. 

## 2014-11-25 NOTE — Addendum Note (Signed)
Addended by: Sheral Flow on: 11/25/2014 03:34 PM   Modules accepted: Orders

## 2014-11-25 NOTE — Addendum Note (Signed)
Addended by: Olena Mater on: 11/25/2014 02:57 PM   Modules accepted: Orders

## 2014-12-04 DIAGNOSIS — M25551 Pain in right hip: Secondary | ICD-10-CM | POA: Diagnosis not present

## 2014-12-04 DIAGNOSIS — M25651 Stiffness of right hip, not elsewhere classified: Secondary | ICD-10-CM | POA: Diagnosis not present

## 2014-12-04 DIAGNOSIS — M1611 Unilateral primary osteoarthritis, right hip: Secondary | ICD-10-CM | POA: Diagnosis not present

## 2015-01-01 DIAGNOSIS — M1611 Unilateral primary osteoarthritis, right hip: Secondary | ICD-10-CM | POA: Diagnosis not present

## 2015-01-22 DIAGNOSIS — S4992XA Unspecified injury of left shoulder and upper arm, initial encounter: Secondary | ICD-10-CM | POA: Diagnosis not present

## 2015-01-22 DIAGNOSIS — M25512 Pain in left shoulder: Secondary | ICD-10-CM | POA: Diagnosis not present

## 2015-01-22 DIAGNOSIS — S46012A Strain of muscle(s) and tendon(s) of the rotator cuff of left shoulder, initial encounter: Secondary | ICD-10-CM | POA: Diagnosis not present

## 2015-01-22 DIAGNOSIS — M25551 Pain in right hip: Secondary | ICD-10-CM | POA: Diagnosis not present

## 2015-03-31 ENCOUNTER — Ambulatory Visit: Payer: Medicare Other | Admitting: Family Medicine

## 2015-04-11 DIAGNOSIS — J069 Acute upper respiratory infection, unspecified: Secondary | ICD-10-CM | POA: Diagnosis not present

## 2015-04-23 ENCOUNTER — Other Ambulatory Visit: Payer: Medicare Other

## 2015-04-23 DIAGNOSIS — R7309 Other abnormal glucose: Secondary | ICD-10-CM | POA: Diagnosis not present

## 2015-04-23 DIAGNOSIS — Z125 Encounter for screening for malignant neoplasm of prostate: Secondary | ICD-10-CM

## 2015-04-23 DIAGNOSIS — Z79899 Other long term (current) drug therapy: Secondary | ICD-10-CM

## 2015-04-23 DIAGNOSIS — I10 Essential (primary) hypertension: Secondary | ICD-10-CM | POA: Diagnosis not present

## 2015-04-23 DIAGNOSIS — E785 Hyperlipidemia, unspecified: Secondary | ICD-10-CM

## 2015-04-23 LAB — COMPREHENSIVE METABOLIC PANEL
ALT: 22 U/L (ref 9–46)
AST: 18 U/L (ref 10–35)
Albumin: 4.4 g/dL (ref 3.6–5.1)
Alkaline Phosphatase: 49 U/L (ref 40–115)
BUN: 15 mg/dL (ref 7–25)
CO2: 27 mmol/L (ref 20–31)
Calcium: 10 mg/dL (ref 8.6–10.3)
Chloride: 101 mmol/L (ref 98–110)
Creat: 1.01 mg/dL (ref 0.70–1.25)
Glucose, Bld: 102 mg/dL — ABNORMAL HIGH (ref 70–99)
Potassium: 4.8 mmol/L (ref 3.5–5.3)
Sodium: 139 mmol/L (ref 135–146)
Total Bilirubin: 0.5 mg/dL (ref 0.2–1.2)
Total Protein: 6.8 g/dL (ref 6.1–8.1)

## 2015-04-23 LAB — CBC WITH DIFFERENTIAL/PLATELET
Basophils Absolute: 0 10*3/uL (ref 0.0–0.1)
Basophils Relative: 0 % (ref 0–1)
Eosinophils Absolute: 0.3 10*3/uL (ref 0.0–0.7)
Eosinophils Relative: 4 % (ref 0–5)
HCT: 44.2 % (ref 39.0–52.0)
Hemoglobin: 14.6 g/dL (ref 13.0–17.0)
Lymphocytes Relative: 25 % (ref 12–46)
Lymphs Abs: 1.7 10*3/uL (ref 0.7–4.0)
MCH: 27.8 pg (ref 26.0–34.0)
MCHC: 33 g/dL (ref 30.0–36.0)
MCV: 84 fL (ref 78.0–100.0)
MPV: 8.8 fL (ref 8.6–12.4)
Monocytes Absolute: 0.5 10*3/uL (ref 0.1–1.0)
Monocytes Relative: 7 % (ref 3–12)
Neutro Abs: 4.4 10*3/uL (ref 1.7–7.7)
Neutrophils Relative %: 64 % (ref 43–77)
Platelets: 335 10*3/uL (ref 150–400)
RBC: 5.26 MIL/uL (ref 4.22–5.81)
RDW: 13.5 % (ref 11.5–15.5)
WBC: 6.8 10*3/uL (ref 4.0–10.5)

## 2015-04-23 LAB — LIPID PANEL
Cholesterol: 253 mg/dL — ABNORMAL HIGH (ref 125–200)
HDL: 32 mg/dL — ABNORMAL LOW (ref >=40)
LDL Cholesterol: 175 mg/dL — ABNORMAL HIGH (ref <130)
Total CHOL/HDL Ratio: 7.9 Ratio — ABNORMAL HIGH (ref <=5.0)
Triglycerides: 232 mg/dL — ABNORMAL HIGH (ref <150)
VLDL: 46 mg/dL — ABNORMAL HIGH (ref <30)

## 2015-04-24 LAB — PSA, MEDICARE: PSA: 0.71 ng/mL (ref <=4.00)

## 2015-04-24 LAB — HEMOGLOBIN A1C
Hgb A1c MFr Bld: 5.9 % — ABNORMAL HIGH (ref <5.7)
Mean Plasma Glucose: 123 mg/dL — ABNORMAL HIGH (ref <117)

## 2015-04-30 ENCOUNTER — Encounter: Payer: Self-pay | Admitting: Family Medicine

## 2015-04-30 ENCOUNTER — Ambulatory Visit
Admission: RE | Admit: 2015-04-30 | Discharge: 2015-04-30 | Disposition: A | Discharge status: Home / Self Care | Payer: Medicare Other | Source: Ambulatory Visit | Attending: Family Medicine | Admitting: Family Medicine

## 2015-04-30 ENCOUNTER — Ambulatory Visit (INDEPENDENT_AMBULATORY_CARE_PROVIDER_SITE_OTHER): Payer: Medicare Other | Admitting: Family Medicine

## 2015-04-30 VITALS — BP 136/68 | HR 72 | Temp 97.7°F | Resp 16 | Ht 68.0 in | Wt 197.0 lb

## 2015-04-30 DIAGNOSIS — M5431 Sciatica, right side: Secondary | ICD-10-CM

## 2015-04-30 DIAGNOSIS — E781 Pure hyperglyceridemia: Secondary | ICD-10-CM | POA: Diagnosis not present

## 2015-04-30 DIAGNOSIS — I10 Essential (primary) hypertension: Secondary | ICD-10-CM | POA: Diagnosis not present

## 2015-04-30 DIAGNOSIS — R7302 Impaired glucose tolerance (oral): Secondary | ICD-10-CM

## 2015-04-30 DIAGNOSIS — G4733 Obstructive sleep apnea (adult) (pediatric): Secondary | ICD-10-CM

## 2015-04-30 DIAGNOSIS — M5136 Other intervertebral disc degeneration, lumbar region: Secondary | ICD-10-CM | POA: Diagnosis not present

## 2015-04-30 DIAGNOSIS — Z9989 Dependence on other enabling machines and devices: Secondary | ICD-10-CM

## 2015-04-30 DIAGNOSIS — M47816 Spondylosis without myelopathy or radiculopathy, lumbar region: Secondary | ICD-10-CM | POA: Diagnosis not present

## 2015-04-30 MED ORDER — COLESTIPOL HCL 1 G PO TABS: 1.0000 g | ORAL_TABLET | Freq: Two times a day (BID) | ORAL | Status: DC

## 2015-04-30 MED ORDER — GABAPENTIN 100 MG PO CAPS: ORAL_CAPSULE | ORAL | Status: DC

## 2015-04-30 NOTE — Assessment & Plan Note (Signed)
A1c has actually improved.

## 2015-04-30 NOTE — Patient Instructions (Addendum)
Get xray of lumbar spine Start gabapentin Increase Colestipol  To twice a day  F/U 6 months for Physical

## 2015-04-30 NOTE — Assessment & Plan Note (Signed)
Well controlled 

## 2015-04-30 NOTE — Progress Notes (Signed)
Patient ID: Brett [name], male   DOB: 1946-01-05, 69 y.o.   MRN: [id number]   Subjective:    Patient ID: Brett [name], male    DOB: 06-01-1945, 69 y.o.   MRN: [id number]  Patient presents for 6 month F/U  Pt here to follow-up on medical problems. He is also here for medication review. He continues to have problems in his back especially since he had a fall about 6 months ago which resulted in injury to his hip. He was seen by orthopedists he had fluid drawn off of his hip and a steroid injection in his hip is done fairly well but he has episodes of severe low back pain with radiating symptoms from his buttocks down to his right foot. This has been on and off since the fall. He is concerned that he may of done something to his back. The pain is worse when he sits long. The time. He tried taken his old pain medication but did not tolerate it. He has been taken over-the-counter meds with minimal relief.  Fasting labs reviewed. He admits that he has been eating a lot of ice cream he's gained 7 pounds since her last visit.  Review Of Systems:  GEN- denies fatigue, fever, weight loss,weakness, recent illness HEENT- denies eye drainage, change in vision, nasal discharge, CVS- denies chest pain, palpitations RESP- denies SOB, cough, wheeze ABD- denies N/V, change in stools, abd pain GU- denies dysuria, hematuria, dribbling, incontinence MSK- +joint pain, muscle aches, injury Neuro- denies headache, dizziness, syncope, seizure activity       Objective:    BP 136/68 mmHg  Pulse 72  Temp(Src) 97.7 F (36.5 C) (Oral)  Resp 16  Ht 5\' 8"  (1.727 m)  Wt 197 lb (89.359 kg)  BMI 29.96 kg/m2 GEN- NAD, alert and oriented x3 HEENT- PERRL, EOMI, non injected sclera, pink conjunctiva, MMM, oropharynx clear CVS- RRR, no murmur RESP-CTAB MSK- Spine NT, fair ROM spine, fair ROM right hip, +SLR Right side , fair ROM bilat knees, ankles- good ROM,  EXT- No edema Pulses- Radial,- 2+        Assessment &  Plan:      Problem List Items Addressed This Visit    OSA on CPAP    Continues to benefit from CPAP use      Hypertriglyceridemia    Increase Colestid to BID to help with cholesterol, discussed diet which he is not interested in changing at this point.      Relevant Medications   colestipol (COLESTID) 1 G tablet   Glucose intolerance (impaired glucose tolerance)    A1c has actually improved.      Essential hypertension - Primary    Well-controlled.      Relevant Medications   colestipol (COLESTID) 1 G tablet    Other Visit Diagnoses    Sciatica associated with disorder of lumbar spine, right        Back pain with radicular symptoms,S/P fall, Xray shows degenerative changes. Start gabapentin at bedtime will start with 100 mg and titrate up to 300 mg.     Relevant Medications    gabapentin (NEURONTIN) 100 MG capsule    Other Relevant Orders    DG Lumbar Spine Complete (Completed)       Note: This dictation was prepared with Dragon dictation along with smaller phrase technology. Any transcriptional errors that result from this process are unintentional.

## 2015-04-30 NOTE — Assessment & Plan Note (Signed)
Continues to benefit from CPAP use 

## 2015-04-30 NOTE — Assessment & Plan Note (Signed)
Increase Colestid to BID to help with cholesterol, discussed diet which he is not interested in changing at this point.

## 2015-05-10 ENCOUNTER — Ambulatory Visit
Admission: RE | Admit: 2015-05-10 | Discharge: 2015-05-10 | Disposition: A | Discharge status: Home / Self Care | Payer: Medicare Other | Source: Ambulatory Visit | Attending: Family Medicine | Admitting: Family Medicine

## 2015-05-10 DIAGNOSIS — M5136 Other intervertebral disc degeneration, lumbar region: Secondary | ICD-10-CM

## 2015-05-10 DIAGNOSIS — M5431 Sciatica, right side: Secondary | ICD-10-CM

## 2015-05-10 DIAGNOSIS — M5126 Other intervertebral disc displacement, lumbar region: Secondary | ICD-10-CM | POA: Diagnosis not present

## 2015-05-25 ENCOUNTER — Other Ambulatory Visit: Payer: Self-pay | Admitting: Family Medicine

## 2015-05-28 ENCOUNTER — Telehealth: Payer: Self-pay | Admitting: Family Medicine

## 2015-05-28 NOTE — Telephone Encounter (Signed)
Patient says he is supposed to have referral to [facility], but has not heard anything from [place]  [phone]

## 2015-05-29 ENCOUNTER — Other Ambulatory Visit: Payer: Self-pay | Admitting: Family Medicine

## 2015-05-29 DIAGNOSIS — M545 Low back pain: Secondary | ICD-10-CM

## 2015-05-29 DIAGNOSIS — M25551 Pain in right hip: Secondary | ICD-10-CM

## 2015-05-29 NOTE — Progress Notes (Unsigned)
Pt called stating he has been hurting in the R Hip and lower back and requested to see ortho specialist, did not want to come in to see PCP, states has been hurting for too long, I placed referral to [facility].

## 2015-05-29 NOTE — Telephone Encounter (Signed)
Referral placed.

## 2015-05-30 NOTE — Progress Notes (Signed)
PT was referred on 12/28 for this, what happened?

## 2015-05-30 NOTE — Progress Notes (Signed)
Referral placed.

## 2015-05-30 NOTE — Progress Notes (Signed)
Noted Okay to place new referral , Send MRI results

## 2015-05-30 NOTE — Progress Notes (Signed)
As stated in his notes from his MRI per Christina:Patient will return call if he wants to proceed with referral to ortho, but nothing was placed in my workqueue.

## 2015-06-04 DIAGNOSIS — M5441 Lumbago with sciatica, right side: Secondary | ICD-10-CM | POA: Diagnosis not present

## 2015-06-04 DIAGNOSIS — M25551 Pain in right hip: Secondary | ICD-10-CM | POA: Diagnosis not present

## 2015-06-04 DIAGNOSIS — M5136 Other intervertebral disc degeneration, lumbar region: Secondary | ICD-10-CM | POA: Diagnosis not present

## 2015-06-04 DIAGNOSIS — G5781 Other specified mononeuropathies of right lower limb: Secondary | ICD-10-CM | POA: Diagnosis not present

## 2015-06-04 DIAGNOSIS — M7061 Trochanteric bursitis, right hip: Secondary | ICD-10-CM | POA: Diagnosis not present

## 2015-06-17 DIAGNOSIS — M5136 Other intervertebral disc degeneration, lumbar region: Secondary | ICD-10-CM | POA: Diagnosis not present

## 2015-06-18 DIAGNOSIS — G8929 Other chronic pain: Secondary | ICD-10-CM | POA: Diagnosis not present

## 2015-06-18 DIAGNOSIS — M5441 Lumbago with sciatica, right side: Secondary | ICD-10-CM | POA: Diagnosis not present

## 2015-07-07 ENCOUNTER — Encounter: Payer: Self-pay | Admitting: Family Medicine

## 2015-07-07 ENCOUNTER — Ambulatory Visit (INDEPENDENT_AMBULATORY_CARE_PROVIDER_SITE_OTHER): Payer: Medicare Other | Admitting: Family Medicine

## 2015-07-07 VITALS — BP 128/64 | HR 72 | Temp 98.0°F | Resp 16 | Ht 68.0 in | Wt 190.0 lb

## 2015-07-07 DIAGNOSIS — J069 Acute upper respiratory infection, unspecified: Secondary | ICD-10-CM | POA: Diagnosis not present

## 2015-07-07 DIAGNOSIS — J0100 Acute maxillary sinusitis, unspecified: Secondary | ICD-10-CM

## 2015-07-07 DIAGNOSIS — R509 Fever, unspecified: Secondary | ICD-10-CM | POA: Diagnosis not present

## 2015-07-07 LAB — INFLUENZA A AND B AG, IMMUNOASSAY
Influenza A Antigen: NOT DETECTED
Influenza B Antigen: NOT DETECTED

## 2015-07-07 MED ORDER — FLUTICASONE PROPIONATE 50 MCG/ACT NA SUSP: 2.0000 | Freq: Every day | NASAL | Status: DC

## 2015-07-07 MED ORDER — AMOXICILLIN 500 MG PO CAPS: 500.0000 mg | ORAL_CAPSULE | Freq: Three times a day (TID) | ORAL | Status: DC

## 2015-07-07 NOTE — Progress Notes (Signed)
Patient ID: Brett [name], male   DOB: 24-Sep-1945, 70 y.o.   MRN: [id number]   Subjective:    Patient ID: Brett [name], male    DOB: 1945-12-10, 70 y.o.   MRN: [id number]  Patient presents for Illness  patient with sinus pressure drainage headache worsened the past few days. He typically gets sinusitis once a year. He has postnasal drip which is causing cough. He's been using Delsym over-the-counter as well as some  Sudafed. He had mild low-grade fever last night. He has not had any chills no body aches. Positive sick contact with his wife with some type of viral illness.    Review Of Systems:  GEN- denies fatigue,+ fever, weight loss,weakness, recent illness HEENT- denies eye drainage, change in vision, +nasal discharge, CVS- denies chest pain, palpitations RESP- denies SOB, +cough, wheeze ABD- denies N/V, change in stools, abd pain Neuro- denies headache, dizziness, syncope, seizure activity       Objective:    BP 128/64 mmHg  Pulse 72  Temp(Src) 98 F (36.7 C) (Oral)  Resp 16  Ht 5\' 8"  (1.727 m)  Wt 190 lb (86.183 kg)  BMI 28.90 kg/m2 GEN- NAD, alert and oriented x3 HEENT- PERRL, EOMI, non injected sclera, pink conjunctiva, MMM, oropharynx mild injection, TM clear bilat no effusion,  + maxillary sinus tenderness, inflammed turbinates,  Nasal drainage  Neck- Supple, no LAD CVS- RRR, no murmur RESP-CTAB EXT- No edema Pulses- Radial 2+       Assessment & Plan:      Problem List Items Addressed This Visit    None    Visit Diagnoses    Acute maxillary sinusitis, recurrence not specified    -  Primary    Amox x 10 days, flonase, Sudafed for no more than 3-4 days. Continue delsym, cough from post nasal drip    Relevant Medications    amoxicillin (AMOXIL) 500 MG capsule    fluticasone (FLONASE) 50 MCG/ACT nasal spray    Other Relevant Orders    Influenza A and B Ag, Immunoassay (Completed)    Acute URI           Note: This dictation was prepared with Dragon  dictation along with smaller phrase technology. Any transcriptional errors that result from this process are unintentional.

## 2015-07-07 NOTE — Patient Instructions (Signed)
Take antibiotics Flonase F/U as needed

## 2015-07-15 DIAGNOSIS — M5441 Lumbago with sciatica, right side: Secondary | ICD-10-CM | POA: Diagnosis not present

## 2015-07-15 DIAGNOSIS — M1611 Unilateral primary osteoarthritis, right hip: Secondary | ICD-10-CM | POA: Diagnosis not present

## 2015-08-22 ENCOUNTER — Other Ambulatory Visit: Payer: Self-pay | Admitting: Family Medicine

## 2015-08-22 NOTE — Telephone Encounter (Signed)
Medication refilled per protocol. 

## 2015-09-11 DIAGNOSIS — M25511 Pain in right shoulder: Secondary | ICD-10-CM | POA: Diagnosis not present

## 2015-09-11 DIAGNOSIS — M19011 Primary osteoarthritis, right shoulder: Secondary | ICD-10-CM | POA: Diagnosis not present

## 2015-09-11 DIAGNOSIS — M1611 Unilateral primary osteoarthritis, right hip: Secondary | ICD-10-CM | POA: Diagnosis not present

## 2015-09-11 DIAGNOSIS — M7061 Trochanteric bursitis, right hip: Secondary | ICD-10-CM | POA: Diagnosis not present

## 2015-09-11 DIAGNOSIS — M25611 Stiffness of right shoulder, not elsewhere classified: Secondary | ICD-10-CM | POA: Diagnosis not present

## 2015-09-23 DIAGNOSIS — M19011 Primary osteoarthritis, right shoulder: Secondary | ICD-10-CM | POA: Diagnosis not present

## 2015-09-26 DIAGNOSIS — M19011 Primary osteoarthritis, right shoulder: Secondary | ICD-10-CM | POA: Diagnosis not present

## 2015-09-30 DIAGNOSIS — M19011 Primary osteoarthritis, right shoulder: Secondary | ICD-10-CM | POA: Diagnosis not present

## 2015-10-03 DIAGNOSIS — M19011 Primary osteoarthritis, right shoulder: Secondary | ICD-10-CM | POA: Diagnosis not present

## 2015-10-07 DIAGNOSIS — M19011 Primary osteoarthritis, right shoulder: Secondary | ICD-10-CM | POA: Diagnosis not present

## 2015-10-09 DIAGNOSIS — M19011 Primary osteoarthritis, right shoulder: Secondary | ICD-10-CM | POA: Diagnosis not present

## 2015-10-10 DIAGNOSIS — M1611 Unilateral primary osteoarthritis, right hip: Secondary | ICD-10-CM | POA: Diagnosis not present

## 2015-10-10 DIAGNOSIS — M25511 Pain in right shoulder: Secondary | ICD-10-CM | POA: Diagnosis not present

## 2015-10-10 DIAGNOSIS — M7061 Trochanteric bursitis, right hip: Secondary | ICD-10-CM | POA: Diagnosis not present

## 2015-10-10 DIAGNOSIS — M19011 Primary osteoarthritis, right shoulder: Secondary | ICD-10-CM | POA: Diagnosis not present

## 2015-10-14 DIAGNOSIS — M19011 Primary osteoarthritis, right shoulder: Secondary | ICD-10-CM | POA: Diagnosis not present

## 2015-10-16 DIAGNOSIS — M19011 Primary osteoarthritis, right shoulder: Secondary | ICD-10-CM | POA: Diagnosis not present

## 2015-10-21 DIAGNOSIS — M19011 Primary osteoarthritis, right shoulder: Secondary | ICD-10-CM | POA: Diagnosis not present

## 2015-10-23 DIAGNOSIS — M19011 Primary osteoarthritis, right shoulder: Secondary | ICD-10-CM | POA: Diagnosis not present

## 2015-10-28 DIAGNOSIS — M19011 Primary osteoarthritis, right shoulder: Secondary | ICD-10-CM | POA: Diagnosis not present

## 2015-10-30 ENCOUNTER — Other Ambulatory Visit: Payer: Self-pay | Admitting: Family Medicine

## 2015-10-30 DIAGNOSIS — M19011 Primary osteoarthritis, right shoulder: Secondary | ICD-10-CM | POA: Diagnosis not present

## 2015-10-31 ENCOUNTER — Telehealth: Payer: Self-pay | Admitting: Family Medicine

## 2015-10-31 ENCOUNTER — Encounter: Payer: Self-pay | Admitting: Family Medicine

## 2015-10-31 ENCOUNTER — Ambulatory Visit (INDEPENDENT_AMBULATORY_CARE_PROVIDER_SITE_OTHER): Payer: Medicare Other | Admitting: Family Medicine

## 2015-10-31 VITALS — BP 162/94 | HR 60 | Temp 98.0°F | Resp 18 | Ht 68.0 in | Wt 183.0 lb

## 2015-10-31 DIAGNOSIS — N400 Enlarged prostate without lower urinary tract symptoms: Secondary | ICD-10-CM | POA: Diagnosis not present

## 2015-10-31 DIAGNOSIS — G4733 Obstructive sleep apnea (adult) (pediatric): Secondary | ICD-10-CM | POA: Diagnosis not present

## 2015-10-31 DIAGNOSIS — E7801 Familial hypercholesterolemia: Secondary | ICD-10-CM | POA: Diagnosis not present

## 2015-10-31 DIAGNOSIS — Z9989 Dependence on other enabling machines and devices: Secondary | ICD-10-CM

## 2015-10-31 DIAGNOSIS — Z0000 Encounter for general adult medical examination without abnormal findings: Secondary | ICD-10-CM | POA: Diagnosis not present

## 2015-10-31 DIAGNOSIS — I10 Essential (primary) hypertension: Secondary | ICD-10-CM

## 2015-10-31 DIAGNOSIS — Z125 Encounter for screening for malignant neoplasm of prostate: Secondary | ICD-10-CM

## 2015-10-31 DIAGNOSIS — E781 Pure hyperglyceridemia: Secondary | ICD-10-CM

## 2015-10-31 DIAGNOSIS — R7302 Impaired glucose tolerance (oral): Secondary | ICD-10-CM | POA: Diagnosis not present

## 2015-10-31 LAB — COMPREHENSIVE METABOLIC PANEL
ALT: 21 U/L (ref 9–46)
AST: 14 U/L (ref 10–35)
Albumin: 4.1 g/dL (ref 3.6–5.1)
Alkaline Phosphatase: 43 U/L (ref 40–115)
BUN: 15 mg/dL (ref 7–25)
CO2: 28 mmol/L (ref 20–31)
Calcium: 8.8 mg/dL (ref 8.6–10.3)
Chloride: 101 mmol/L (ref 98–110)
Creat: 0.83 mg/dL (ref 0.70–1.25)
Glucose, Bld: 98 mg/dL (ref 70–99)
Potassium: 4.3 mmol/L (ref 3.5–5.3)
Sodium: 140 mmol/L (ref 135–146)
Total Bilirubin: 0.5 mg/dL (ref 0.2–1.2)
Total Protein: 6.4 g/dL (ref 6.1–8.1)

## 2015-10-31 LAB — URINALYSIS, MICROSCOPIC ONLY
Bacteria, UA: NONE SEEN [HPF]
Casts: NONE SEEN [LPF]
Crystals: NONE SEEN [HPF]
WBC, UA: NONE SEEN WBC/HPF (ref <=5)
Yeast: NONE SEEN [HPF]

## 2015-10-31 LAB — URINALYSIS, ROUTINE W REFLEX MICROSCOPIC
Bilirubin Urine: NEGATIVE
Glucose, UA: NEGATIVE
Ketones, ur: NEGATIVE
Leukocytes, UA: NEGATIVE
Nitrite: NEGATIVE
Specific Gravity, Urine: 1.02 (ref 1.001–1.035)
pH: 6 (ref 5.0–8.0)

## 2015-10-31 LAB — CBC WITH DIFFERENTIAL/PLATELET
Basophils Absolute: 0 cells/uL (ref 0–200)
Basophils Relative: 0 %
Eosinophils Absolute: 62 cells/uL (ref 15–500)
Eosinophils Relative: 1 %
HCT: 44.8 % (ref 38.5–50.0)
Hemoglobin: 14.6 g/dL (ref 13.0–17.0)
Lymphocytes Relative: 26 %
Lymphs Abs: 1612 cells/uL (ref 850–3900)
MCH: 26.9 pg — ABNORMAL LOW (ref 27.0–33.0)
MCHC: 32.6 g/dL (ref 32.0–36.0)
MCV: 82.7 fL (ref 80.0–100.0)
MPV: 8.6 fL (ref 7.5–12.5)
Monocytes Absolute: 496 cells/uL (ref 200–950)
Monocytes Relative: 8 %
Neutro Abs: 4030 cells/uL (ref 1500–7800)
Neutrophils Relative %: 65 %
Platelets: 269 10*3/uL (ref 140–400)
RBC: 5.42 MIL/uL (ref 4.20–5.80)
RDW: 14.4 % (ref 11.0–15.0)
WBC: 6.2 10*3/uL (ref 3.8–10.8)

## 2015-10-31 LAB — LIPID PANEL
Cholesterol: 250 mg/dL — ABNORMAL HIGH (ref 125–200)
HDL: 44 mg/dL (ref >=40)
LDL Cholesterol: 174 mg/dL — ABNORMAL HIGH (ref <130)
Total CHOL/HDL Ratio: 5.7 Ratio — ABNORMAL HIGH (ref <=5.0)
Triglycerides: 162 mg/dL — ABNORMAL HIGH (ref <150)
VLDL: 32 mg/dL — ABNORMAL HIGH (ref <30)

## 2015-10-31 MED ORDER — TAMSULOSIN HCL 0.4 MG PO CAPS: 0.4000 mg | ORAL_CAPSULE | Freq: Every day | ORAL | Status: DC

## 2015-10-31 NOTE — Patient Instructions (Signed)
Try the flomax at bedtime for urination We will call with results Keep an eye on blood pressure F/U 6 months

## 2015-10-31 NOTE — Assessment & Plan Note (Addendum)
Blood pressure is uncontrolled however he does not want medications.  Discussed that he can have heart attack or stroke with this elevated blood pressure as well as renal problems. He's been monitoring blood pressure at home.

## 2015-10-31 NOTE — Progress Notes (Signed)
Patient ID: Brett [name], male   DOB: 09-25-1945, 70 y.o.   MRN: [id number]  Subjective:   Patient presents for Medicare Annual/Subsequent preventive examination.   Pt here for annual wellness, medications reviewed  He stopped his blood pressure medicine almost a year ago states that he does not like to take the medication. He also is no longer on gabapentin or Ambien. He does take his cholesterol medication which helps his bowels though he had to go up to 2 tablets of the colon staple recently because some diarrhea as well as gastroenterologist has recommended in the past. He continues to have back pain but he tries to push through and use ibuprofen only as needed. His weight is down 7 pounds intentionally. Next  He is concerned about some difficulty urinating for the past month +hesitancy, and does not empty bladder all the way and the mornings occasional dribbling but this is not the main issue. Denies any dysuria. Review Past Medical/Family/Social: Per EMR    Risk Factors  Current exercise habits: walks Dietary issues discussed: Yes ,weight down 7lbs   Cardiac risk factors: HTN, hyperlipidemia   Depression Screen  (Note: if answer to either of the following is "Yes", a more complete depression screening is indicated)  Over the past two weeks, have you felt down, depressed or hopeless? No Over the past two weeks, have you felt little interest or pleasure in doing things? No Have you lost interest or pleasure in daily life? No Do you often feel hopeless? No Do you cry easily over simple problems? No   Activities of Daily Living  In your present state of health, do you have any difficulty performing the following activities?:  Driving? No  Managing money? No  Feeding yourself? No  Getting from bed to chair? No  Climbing a flight of stairs? No  Preparing food and eating?: No  Bathing or showering? No  Getting dressed: No  Getting to the toilet? No  Using the toilet:No  Moving  around from place to place: No  In the past year have you fallen or had a near fall?:No  Are you sexually active? No  Do you have more than one partner? No   Hearing Difficulties: No  Do you often ask people to speak up or repeat themselves? No  Do you experience ringing or noises in your ears? No Do you have difficulty understanding soft or whispered voices? No  Do you feel that you have a problem with memory? No Do you often misplace items? No  Do you feel safe at home? Yes  Cognitive Testing  Alert? Yes Normal Appearance?Yes  Oriented to person? Yes Place? Yes  Time? Yes  Recall of three objects? Yes  Can perform simple calculations? Yes  Displays appropriate judgment?Yes  Can read the correct time from a watch face?Yes   List the Names of Other Physician/Practitioners you currently use: None Currently     Screening Tests / Date Colonoscopy      Done in 2014          Zostavax UTD  Tetanus/tdap - not covered  Pneumonia- UTD   ROS: GEN- denies fatigue, fever, weight loss,weakness, recent illness HEENT- denies eye drainage, change in vision, nasal discharge, CVS- denies chest pain, palpitations RESP- denies SOB, cough, wheeze ABD- denies N/V, change in stools, abd pain GU- denies dysuria, hematuria, dribbling, incontinence MSK- + joint pain, muscle aches, injury Neuro- denies headache, dizziness, syncope, seizure activity  PHYSICAL: GEN- NAD, alert and oriented  x3 HEENT- PERRL, EOMI, non injected sclera, pink conjunctiva, MMM, oropharynx clear Neck- Supple, no thryomegaly CVS- RRR, no murmur RESP-CTAB ABD-NABS,soft,NT,ND GU- rectal external skin tags, normal tone, enlarged prostate, no nodules, no bogginess, FOBT neg  EXT- No edema Pulses- Radial, DP- 2+    Assessment:    Annual wellness medicare exam   Plan:    During the course of the visit the patient was educated and counseled about appropriate screening and preventive services including:  Note he  does decline further colonoscopy  BPH noted- trial of flomax   Screen Neg  for depression. Diet review for nutrition referral? Yes ____ Not Indicated __x__  Patient Instructions (the written plan) was given to the patient.  Medicare Attestation  I have personally reviewed:  The patient's medical and social history  Their use of alcohol, tobacco or illicit drugs  Their current medications and supplements  The patient's functional ability including ADLs,fall risks, home safety risks, cognitive, and hearing and visual impairment  Diet and physical activities  Evidence for depression or mood disorders  The patient's weight, height, BMI, and visual acuity have been recorded in the chart. I have made referrals, counseling, and provided education to the patient based on review of the above and I have provided the patient with a written personalized care plan for preventive services.

## 2015-11-01 LAB — URINE CULTURE: Colony Count: 2000

## 2015-11-01 LAB — HEMOGLOBIN A1C
Hgb A1c MFr Bld: 6.2 % — ABNORMAL HIGH (ref <5.7)
Mean Plasma Glucose: 131 mg/dL

## 2015-11-01 LAB — PSA, MEDICARE: PSA: 0.65 ng/mL (ref <=4.00)

## 2015-11-04 DIAGNOSIS — M19011 Primary osteoarthritis, right shoulder: Secondary | ICD-10-CM | POA: Diagnosis not present

## 2015-11-06 DIAGNOSIS — M19011 Primary osteoarthritis, right shoulder: Secondary | ICD-10-CM | POA: Diagnosis not present

## 2015-11-11 DIAGNOSIS — M19011 Primary osteoarthritis, right shoulder: Secondary | ICD-10-CM | POA: Diagnosis not present

## 2015-11-17 DIAGNOSIS — M19011 Primary osteoarthritis, right shoulder: Secondary | ICD-10-CM | POA: Diagnosis not present

## 2015-11-20 ENCOUNTER — Telehealth: Payer: Self-pay | Admitting: Family Medicine

## 2015-11-20 DIAGNOSIS — M19011 Primary osteoarthritis, right shoulder: Secondary | ICD-10-CM | POA: Diagnosis not present

## 2015-11-20 MED ORDER — TAMSULOSIN HCL 0.4 MG PO CAPS
0.4000 mg | ORAL_CAPSULE | Freq: Every day | ORAL | Status: DC
Start: 2015-11-20 — End: 2016-10-18

## 2015-11-20 NOTE — Telephone Encounter (Signed)
Patient should continue medication QD.   Call placed to patient and patient made aware.   Prescription sent to pharmacy.

## 2015-11-20 NOTE — Telephone Encounter (Signed)
Patient has question about the flomax that was prescribed by dr Buelah Manis, needs to know if he needs to talk this everyday, if so needs a refill to go to [facility]   [phone]

## 2015-11-24 DIAGNOSIS — M25511 Pain in right shoulder: Secondary | ICD-10-CM | POA: Diagnosis not present

## 2015-11-24 DIAGNOSIS — M19011 Primary osteoarthritis, right shoulder: Secondary | ICD-10-CM | POA: Diagnosis not present

## 2015-11-24 DIAGNOSIS — M25611 Stiffness of right shoulder, not elsewhere classified: Secondary | ICD-10-CM | POA: Diagnosis not present

## 2015-11-24 DIAGNOSIS — M1611 Unilateral primary osteoarthritis, right hip: Secondary | ICD-10-CM | POA: Diagnosis not present

## 2015-11-25 DIAGNOSIS — L918 Other hypertrophic disorders of the skin: Secondary | ICD-10-CM | POA: Diagnosis not present

## 2015-11-25 DIAGNOSIS — L570 Actinic keratosis: Secondary | ICD-10-CM | POA: Diagnosis not present

## 2015-11-25 DIAGNOSIS — D239 Other benign neoplasm of skin, unspecified: Secondary | ICD-10-CM | POA: Diagnosis not present

## 2015-12-01 ENCOUNTER — Other Ambulatory Visit: Payer: Self-pay | Admitting: Family Medicine

## 2015-12-01 NOTE — Telephone Encounter (Signed)
Refill appropriate and filled per protocol. 

## 2015-12-15 ENCOUNTER — Encounter: Payer: Self-pay | Admitting: Family Medicine

## 2015-12-15 ENCOUNTER — Ambulatory Visit (INDEPENDENT_AMBULATORY_CARE_PROVIDER_SITE_OTHER): Payer: Medicare Other | Admitting: Family Medicine

## 2015-12-15 VITALS — BP 132/80 | HR 78 | Temp 98.9°F | Resp 14 | Ht 68.0 in | Wt 187.0 lb

## 2015-12-15 DIAGNOSIS — R1084 Generalized abdominal pain: Secondary | ICD-10-CM | POA: Diagnosis not present

## 2015-12-15 DIAGNOSIS — R911 Solitary pulmonary nodule: Secondary | ICD-10-CM

## 2015-12-15 DIAGNOSIS — W57XXXA Bitten or stung by nonvenomous insect and other nonvenomous arthropods, initial encounter: Secondary | ICD-10-CM | POA: Diagnosis not present

## 2015-12-15 DIAGNOSIS — T148 Other injury of unspecified body region: Secondary | ICD-10-CM | POA: Diagnosis not present

## 2015-12-15 LAB — CBC
HCT: 45.7 % (ref 38.5–50.0)
Hemoglobin: 14.9 g/dL (ref 13.0–17.0)
MCH: 28.4 pg (ref 27.0–33.0)
MCHC: 32.6 g/dL (ref 32.0–36.0)
MCV: 87 fL (ref 80.0–100.0)
Platelets: 267 10*3/uL (ref 140–400)
RBC: 5.25 MIL/uL (ref 4.20–5.80)
RDW: 14.3 % (ref 11.0–15.0)
WBC: 8.2 10*3/uL (ref 3.8–10.8)

## 2015-12-15 MED ORDER — PROMETHAZINE HCL 12.5 MG PO TABS: 12.5000 mg | ORAL_TABLET | Freq: Four times a day (QID) | ORAL | 0 refills | Status: DC | PRN

## 2015-12-15 NOTE — Progress Notes (Signed)
Subjective:    Patient ID: Brett [name], male    DOB: 03-25-1946, 70 y.o.   MRN: [id number]  Patient presents for Tick Removal (x10 days prior- fever/ chills, muscle aches, nausea- denies rash to site) Patient here with subjective fever chills muscle aches nausea no vomiting multiple bowel movements though no diarrhea until this AM  for the past 3-4 days. He did have a tick removed off the him about a week and a half ago but did not break out any rash. No other known sick contacts. He has not noted any blood in the stool. He is still able to eat even know he is nauseous. He denies any joint swelling but the muscle aches are more in his calves. He has not had any new medications. He still been able to work outside and do his regular activities  He also requests a follow-up on the spot on his lungs. I do not have any records with regards to this however he had moved from [place] before he established with being. He is being getting serial images of his chest to follow some spot he is not sure if it was a nodular area of fibrosis scarring. He is a nonsmoker. Is been a couple years since he has had any follow-up on this.  Review Of Systems:  GEN- denies fatigue,+ fever, weight loss,weakness, recent illness HEENT- denies eye drainage, change in vision, nasal discharge, CVS- denies chest pain, palpitations RESP- denies SOB, cough, wheeze ABD- + N/V, +change in stools,+ abd pain GU- denies dysuria, hematuria, dribbling, incontinence MSK- denies joint pain, muscle aches, injury Neuro- denies headache, dizziness, syncope, seizure activity       Objective:    BP 132/80 (BP Location: Right Arm, Patient Position: Standing, Cuff Size: Normal)   Pulse 78   Temp 98.9 F (37.2 C) (Oral)   Resp 14   Ht 5\' 8"  (1.727 m)   Wt 187 lb (84.8 kg)   BMI 28.43 kg/m  GEN- NAD, alert and oriented x3,well appearing  HEENT- PERRL, EOMI, non injected sclera, pink conjunctiva, MMM, oropharynx clear Neck- Supple,  no LAD  CVS- RRR, no murmur RESP-CTAB ABD-NABS,soft,mild TTP generalized, no rebound, no guarding, no CVA tenderness  Skin in tact  EXT- No edema Pulses- Radial - 2+        Assessment & Plan:      Problem List Items Addressed This Visit    None    Visit Diagnoses    Tick bite    -  Primary   WBC in office normal, possible his abd pain, nausea more viral illness but with timing of tick bite check titers, no rash with bite, given phenergan tablets   Relevant Orders   B. burgdorfi antibodies   Lung nodule       Ct of chest to be done to f/u spot on lung, Unfortunately I have no other imaging to compare   Relevant Orders   CT CHEST WO CONTRAST   Generalized abdominal pain       Relevant Orders   CBC (Completed)   Comprehensive metabolic panel   B. burgdorfi antibodies      Note: This dictation was prepared with Dragon dictation along with smaller phrase technology. Any transcriptional errors that result from this process are unintentional.

## 2015-12-15 NOTE — Patient Instructions (Signed)
Take the zofran We will call with lab results Plenty of fluids  F/U pending results  CT scan to be done of chest

## 2015-12-16 LAB — COMPREHENSIVE METABOLIC PANEL
ALT: 19 U/L (ref 9–46)
AST: 16 U/L (ref 10–35)
Albumin: 4.2 g/dL (ref 3.6–5.1)
Alkaline Phosphatase: 48 U/L (ref 40–115)
BUN: 12 mg/dL (ref 7–25)
CO2: 26 mmol/L (ref 20–31)
Calcium: 9.1 mg/dL (ref 8.6–10.3)
Chloride: 104 mmol/L (ref 98–110)
Creat: 0.89 mg/dL (ref 0.70–1.25)
Glucose, Bld: 99 mg/dL (ref 70–99)
Potassium: 3.6 mmol/L (ref 3.5–5.3)
Sodium: 142 mmol/L (ref 135–146)
Total Bilirubin: 0.6 mg/dL (ref 0.2–1.2)
Total Protein: 6.4 g/dL (ref 6.1–8.1)

## 2015-12-16 LAB — LYME AB/WESTERN BLOT REFLEX: B burgdorferi Ab IgG+IgM: <0.9 Index (ref <0.90)

## 2015-12-17 ENCOUNTER — Other Ambulatory Visit: Payer: Self-pay | Admitting: *Deleted

## 2015-12-17 MED ORDER — DOXYCYCLINE HYCLATE 100 MG PO TABS: 100.0000 mg | ORAL_TABLET | Freq: Two times a day (BID) | ORAL | 0 refills | Status: DC

## 2015-12-26 ENCOUNTER — Ambulatory Visit
Admission: RE | Admit: 2015-12-26 | Discharge: 2015-12-26 | Disposition: A | Discharge status: Home / Self Care | Payer: Medicare Other | Source: Ambulatory Visit | Attending: Family Medicine | Admitting: Family Medicine

## 2015-12-26 DIAGNOSIS — R911 Solitary pulmonary nodule: Secondary | ICD-10-CM

## 2015-12-26 DIAGNOSIS — R918 Other nonspecific abnormal finding of lung field: Secondary | ICD-10-CM | POA: Diagnosis not present

## 2016-01-21 ENCOUNTER — Other Ambulatory Visit: Payer: Self-pay | Admitting: Family Medicine

## 2016-01-21 MED ORDER — ZOLPIDEM TARTRATE 10 MG PO TABS: 10.0000 mg | ORAL_TABLET | Freq: Every evening | ORAL | 1 refills | Status: DC | PRN

## 2016-01-21 NOTE — Telephone Encounter (Signed)
Pt request refill for Ambien Wife was here for appt

## 2016-01-23 DIAGNOSIS — M19011 Primary osteoarthritis, right shoulder: Secondary | ICD-10-CM | POA: Diagnosis not present

## 2016-01-23 DIAGNOSIS — M25511 Pain in right shoulder: Secondary | ICD-10-CM | POA: Diagnosis not present

## 2016-01-23 DIAGNOSIS — M25611 Stiffness of right shoulder, not elsewhere classified: Secondary | ICD-10-CM | POA: Diagnosis not present

## 2016-01-29 DIAGNOSIS — M19011 Primary osteoarthritis, right shoulder: Secondary | ICD-10-CM | POA: Diagnosis not present

## 2016-01-31 ENCOUNTER — Other Ambulatory Visit: Payer: Self-pay | Admitting: Family Medicine

## 2016-02-02 MED ORDER — COLESTIPOL HCL 1 G PO TABS: 1.0000 g | ORAL_TABLET | Freq: Two times a day (BID) | ORAL | 1 refills | Status: DC

## 2016-02-02 NOTE — Addendum Note (Signed)
Addended by: Clarnce Flock E on: 02/02/2016 02:50 PM   Modules accepted: Orders

## 2016-03-09 ENCOUNTER — Ambulatory Visit (INDEPENDENT_AMBULATORY_CARE_PROVIDER_SITE_OTHER): Payer: Medicare Other | Admitting: Family Medicine

## 2016-03-09 ENCOUNTER — Encounter: Payer: Self-pay | Admitting: Family Medicine

## 2016-03-09 VITALS — BP 132/74 | HR 70 | Temp 98.0°F | Resp 14 | Ht 68.0 in | Wt 178.0 lb

## 2016-03-09 DIAGNOSIS — M62838 Other muscle spasm: Secondary | ICD-10-CM

## 2016-03-09 DIAGNOSIS — M5030 Other cervical disc degeneration, unspecified cervical region: Secondary | ICD-10-CM

## 2016-03-09 MED ORDER — METHYLPREDNISOLONE 4 MG PO TBPK: ORAL_TABLET | ORAL | 0 refills | Status: DC

## 2016-03-09 MED ORDER — HYDROCODONE-ACETAMINOPHEN 5-325 MG PO TABS: 1.0000 | ORAL_TABLET | Freq: Four times a day (QID) | ORAL | 0 refills | Status: DC | PRN

## 2016-03-09 MED ORDER — TIZANIDINE HCL 2 MG PO CAPS: 2.0000 mg | ORAL_CAPSULE | Freq: Three times a day (TID) | ORAL | 0 refills | Status: DC | PRN

## 2016-03-09 NOTE — Assessment & Plan Note (Addendum)
Known arthritis as well as chronic shoulder pain which needs surgical intervention. I think lifting the heavy object has caused some spasm into the neck and inflammation in these joints. She has been on nonsteroidal anti-inflammatories at a [facility] improvement on and give him a muscle relaxer Zanaflex, Medrol Dosepak and a short course of Norco

## 2016-03-09 NOTE — Patient Instructions (Signed)
Stop the Diclofenac while on the steroid Use zanaflex for spasm norco for pain F/U as previous

## 2016-03-09 NOTE — Progress Notes (Signed)
Subjective:    Patient ID: Brett [name], male    DOB: 1945/11/29, 70 y.o.   MRN: [id number]  Patient presents for R Side Neck Pain (x2 weeks- states that he thinks he pulled muscle while moving rug- has been using Ice and arm compressess with IBU with no relief)  Patient here with spasm and pain on the right side of his neck extending to his shoulder for the past 2 weeks. It occurred after he wasted up a heavy rug and place it onto his shoulder to move it. He has known severe arthritis in his shoulder he has to requires shoulder surgery but they are holding off at this time. He is using Aleve and his diclofenac with no improvement. He's been using heat and ice and topicals with no improvement. He denies any tingling or numbness into his hand. He also has noted degenerative disc disease in the cervical spine.   Review Of Systems:  GEN- denies fatigue, fever, weight loss,weakness, recent illness HEENT- denies eye drainage, change in vision, nasal discharge, CVS- denies chest pain, palpitations RESP- denies SOB, cough, wheeze ABD- denies N/V, change in stools, abd pain GU- denies dysuria, hematuria, dribbling, incontinence MSK- + joint pain, +muscle aches, injury Neuro- denies headache, dizziness, syncope, seizure activity       Objective:    BP 132/74 (BP Location: Left Arm, Patient Position: Sitting, Cuff Size: Normal)   Pulse 70   Temp 98 F (36.7 C) (Oral)   Resp 14   Ht 5\' 8"  (1.727 m)   Wt 178 lb (80.7 kg)   BMI 27.06 kg/m  GEN- NAD, alert and oriented x3 HEENT- PERRL, EOMI, non injected sclera, pink conjunctiva, MMM, oropharynx clear Neck- Supple, no thyromegaly, fair ROM , C spine NT, TTP ALONG RIGHT trapezius , + spasm and knot palpated   SCM non tender MSK- Decreased ROM RUE, rotator cuff in tact  Ext- no edema         Assessment & Plan:      Problem List Items Addressed This Visit    DDD (degenerative disc disease), cervical    Known arthritis as well as  chronic shoulder pain which needs surgical intervention. I think lifting the heavy object has caused some spasm into the neck and inflammation in these joints. She has been on nonsteroidal anti-inflammatories at a mole improvement on and give him a muscle relaxer Zanaflex, Medrol Dosepak and a short course of Norco at bedtime      Relevant Medications   methylPREDNISolone (MEDROL DOSEPAK) 4 MG TBPK tablet   tizanidine (ZANAFLEX) 2 MG capsule   HYDROcodone-acetaminophen (NORCO) 5-325 MG tablet    Other Visit Diagnoses    Neck muscle spasm    -  Primary      Note: This dictation was prepared with Dragon dictation along with smaller phrase technology. Any transcriptional errors that result from this process are unintentional.

## 2016-04-14 DIAGNOSIS — M1611 Unilateral primary osteoarthritis, right hip: Secondary | ICD-10-CM | POA: Diagnosis not present

## 2016-04-14 DIAGNOSIS — M19011 Primary osteoarthritis, right shoulder: Secondary | ICD-10-CM | POA: Diagnosis not present

## 2016-05-03 ENCOUNTER — Ambulatory Visit (INDEPENDENT_AMBULATORY_CARE_PROVIDER_SITE_OTHER): Payer: Medicare Other | Admitting: Family Medicine

## 2016-05-03 ENCOUNTER — Encounter: Payer: Self-pay | Admitting: Family Medicine

## 2016-05-03 VITALS — BP 142/80 | HR 62 | Temp 98.3°F | Resp 14 | Ht 68.0 in | Wt 180.0 lb

## 2016-05-03 DIAGNOSIS — E781 Pure hyperglyceridemia: Secondary | ICD-10-CM | POA: Diagnosis not present

## 2016-05-03 DIAGNOSIS — R7302 Impaired glucose tolerance (oral): Secondary | ICD-10-CM | POA: Diagnosis not present

## 2016-05-03 DIAGNOSIS — I10 Essential (primary) hypertension: Secondary | ICD-10-CM

## 2016-05-03 LAB — LIPID PANEL
Cholesterol: 219 mg/dL — ABNORMAL HIGH (ref <200)
HDL: 37 mg/dL — ABNORMAL LOW (ref >40)
LDL Cholesterol: 156 mg/dL — ABNORMAL HIGH (ref <100)
Total CHOL/HDL Ratio: 5.9 Ratio — ABNORMAL HIGH (ref <5.0)
Triglycerides: 129 mg/dL (ref <150)
VLDL: 26 mg/dL (ref <30)

## 2016-05-03 LAB — COMPREHENSIVE METABOLIC PANEL
ALT: 18 U/L (ref 9–46)
AST: 15 U/L (ref 10–35)
Albumin: 4.1 g/dL (ref 3.6–5.1)
Alkaline Phosphatase: 42 U/L (ref 40–115)
BUN: 17 mg/dL (ref 7–25)
CO2: 30 mmol/L (ref 20–31)
Calcium: 9.1 mg/dL (ref 8.6–10.3)
Chloride: 103 mmol/L (ref 98–110)
Creat: 0.99 mg/dL (ref 0.70–1.25)
Glucose, Bld: 94 mg/dL (ref 70–99)
Potassium: 4.6 mmol/L (ref 3.5–5.3)
Sodium: 141 mmol/L (ref 135–146)
Total Bilirubin: 0.5 mg/dL (ref 0.2–1.2)
Total Protein: 6.4 g/dL (ref 6.1–8.1)

## 2016-05-03 LAB — CBC WITH DIFFERENTIAL/PLATELET
Basophils Absolute: 0 cells/uL (ref 0–200)
Basophils Relative: 0 %
Eosinophils Absolute: 68 cells/uL (ref 15–500)
Eosinophils Relative: 1 %
HCT: 43 % (ref 38.5–50.0)
Hemoglobin: 14.1 g/dL (ref 13.0–17.0)
Lymphocytes Relative: 27 %
Lymphs Abs: 1836 cells/uL (ref 850–3900)
MCH: 27.9 pg (ref 27.0–33.0)
MCHC: 32.8 g/dL (ref 32.0–36.0)
MCV: 85.1 fL (ref 80.0–100.0)
MPV: 8.5 fL (ref 7.5–12.5)
Monocytes Absolute: 476 cells/uL (ref 200–950)
Monocytes Relative: 7 %
Neutro Abs: 4420 cells/uL (ref 1500–7800)
Neutrophils Relative %: 65 %
Platelets: 225 10*3/uL (ref 140–400)
RBC: 5.05 MIL/uL (ref 4.20–5.80)
RDW: 14 % (ref 11.0–15.0)
WBC: 6.8 10*3/uL (ref 3.8–10.8)

## 2016-05-03 NOTE — Patient Instructions (Signed)
F/U 6 months for Physical  

## 2016-05-03 NOTE — Progress Notes (Signed)
Subjective:    Patient ID: Brett [name], male    DOB: [birth year], 70 y.o.   MRN: [id number]  Patient presents for 6 month F/U (is fasting) Patient here to follow-up chronic medical problems. He has no specific concerns today. He disease and have some joint pain was seen by orthopedics had a shot place in his right shoulder he will likely need rotator cuff surgery in 2018.  Given his wife both admit to some stress they have a lot of family coming in this week. States that the blood pressure has been in the 140s recently but no chest pain no shortness of breath. He is due for his fasting labs. Medications reviewed. At the last check his A1c was going up as well as his cholesterol. He's been trying to change his diet.    Review Of Systems:  GEN- denies fatigue, fever, weight loss,weakness, recent illness HEENT- denies eye drainage, change in vision, nasal discharge, CVS- denies chest pain, palpitations RESP- denies SOB, cough, wheeze ABD- denies N/V, change in stools, abd pain GU- denies dysuria, hematuria, dribbling, incontinence MSK- + joint pain, muscle aches, injury Neuro- denies headache, dizziness, syncope, seizure activity       Objective:    BP (!) 142/80 (BP Location: Left Arm, Patient Position: Sitting, Cuff Size: Large)   Pulse 62   Temp 98.3 F (36.8 C) (Oral)   Resp 14   Ht 5\' 8"  (1.727 m)   Wt 180 lb (81.6 kg)   SpO2 98%   BMI 27.37 kg/m  GEN- NAD, alert and oriented x3 HEENT- PERRL, EOMI, non injected sclera, pink conjunctiva, MMM, oropharynx clear Neck- Supple, no thyromegaly CVS- RRR, no murmur RESP-CTAB ABD-NABS,soft,NT,ND EXT- No edema Pulses- Radial 2+  BP recheck 148/88        Assessment & Plan:      Problem List Items Addressed This Visit    Hypertriglyceridemia - Primary   Relevant Orders   Lipid panel   Glucose intolerance (impaired glucose tolerance)   Relevant Orders   Hemoglobin A1c   Essential hypertension    His blood pressure  is trending back up. He has declined medication though previously was controlled with amlodipine. He does continue to monitor at home. His fasting labs to be done. He is at rest for a cardiovascular event S he also has poor lung diabetes and cholesterol.      Relevant Orders   CBC with Differential/Platelet   Comprehensive metabolic panel      Note: This dictation was prepared with Dragon dictation along with smaller phrase technology. Any transcriptional errors that result from this process are unintentional.

## 2016-05-03 NOTE — Assessment & Plan Note (Signed)
His blood pressure is trending back up. He has declined medication though previously was controlled with amlodipine. He does continue to monitor at home. His fasting labs to be done. He is at rest for a cardiovascular event S he also has poor lung diabetes and cholesterol.

## 2016-05-04 LAB — HEMOGLOBIN A1C
Hgb A1c MFr Bld: 5.6 % (ref <5.7)
Mean Plasma Glucose: 114 mg/dL

## 2016-06-01 DIAGNOSIS — M1611 Unilateral primary osteoarthritis, right hip: Secondary | ICD-10-CM | POA: Diagnosis not present

## 2016-06-18 ENCOUNTER — Ambulatory Visit (INDEPENDENT_AMBULATORY_CARE_PROVIDER_SITE_OTHER): Payer: Medicare HMO

## 2016-06-18 DIAGNOSIS — Z23 Encounter for immunization: Secondary | ICD-10-CM

## 2016-07-15 DIAGNOSIS — M19042 Primary osteoarthritis, left hand: Secondary | ICD-10-CM | POA: Diagnosis not present

## 2016-07-19 DIAGNOSIS — M19011 Primary osteoarthritis, right shoulder: Secondary | ICD-10-CM | POA: Diagnosis not present

## 2016-07-26 ENCOUNTER — Other Ambulatory Visit: Payer: Self-pay | Admitting: Family Medicine

## 2016-07-31 ENCOUNTER — Other Ambulatory Visit: Payer: Self-pay | Admitting: Family Medicine

## 2016-08-16 ENCOUNTER — Encounter: Payer: Self-pay | Admitting: Family Medicine

## 2016-08-16 ENCOUNTER — Ambulatory Visit (INDEPENDENT_AMBULATORY_CARE_PROVIDER_SITE_OTHER): Payer: Medicare HMO | Admitting: Family Medicine

## 2016-08-16 VITALS — BP 142/78 | HR 72 | Temp 98.0°F | Resp 12 | Ht 68.0 in | Wt 195.0 lb

## 2016-08-16 DIAGNOSIS — J0110 Acute frontal sinusitis, unspecified: Secondary | ICD-10-CM | POA: Diagnosis not present

## 2016-08-16 MED ORDER — TRIAMCINOLONE ACETONIDE 55 MCG/ACT NA AERO: 2.0000 | INHALATION_SPRAY | Freq: Two times a day (BID) | NASAL | 0 refills | Status: DC

## 2016-08-16 MED ORDER — AMOXICILLIN 875 MG PO TABS: 875.0000 mg | ORAL_TABLET | Freq: Two times a day (BID) | ORAL | 0 refills | Status: DC

## 2016-08-16 MED ORDER — LORATADINE 10 MG PO TABS
10.0000 mg | ORAL_TABLET | Freq: Every day | ORAL | 0 refills | Status: DC
Start: 2016-08-16 — End: 2016-10-21

## 2016-08-16 MED ORDER — METHYLPREDNISOLONE ACETATE 40 MG/ML IJ SUSP
40.0000 mg | Freq: Once | INTRAMUSCULAR | Status: AC
  Administered 2016-08-16: 40 mg via INTRAMUSCULAR

## 2016-08-16 NOTE — Patient Instructions (Signed)
Take antibiotics as prescribed  Use nascort  Steroid shot given  Take claritin

## 2016-08-16 NOTE — Progress Notes (Signed)
Subjective:    Patient ID: Brett [name], male    DOB: [birth year], 71 y.o.   MRN: [id number]  Patient presents for Illness (x 10 days- productive cough with green mucus, sinus pressure, HA, nasal congestion, hoarseness) Patient here with sinus pressure or drainage postnasal drip worsening the past 10 days. He's unsure if he's had any fever. His cough is mild production He's been taking Sudafed but is now clogged up again. He's also been using Advil. Not had any difficulty breathing from chest symptoms.   Review Of Systems:  GEN- denies fatigue, fever, weight loss,weakness, recent illness HEENT- denies eye drainage, change in vision, +nasal discharge, CVS- denies chest pain, palpitations RESP- denies SOB, +cough, wheeze ABD- denies N/V, change in stools, abd pain Neuro- + sinus headache, dizziness, syncope, seizure activity       Objective:    BP (!) 142/78   Pulse 72   Temp 98 F (36.7 C) (Oral)   Resp 12   Ht 5\' 8"  (1.727 m)   Wt 195 lb (88.5 kg)   SpO2 97%   BMI 29.65 kg/m  GEN- NAD, alert and oriented x3 HEENT- PERRL, EOMI, non injected sclera, pink conjunctiva, MMM, oropharynx mild injection, TM clear bilat no effusion,  + frontal/ maxillary sinus tenderness, inflammed turbinates,  Nasal drainage  Neck- Supple, + cervical LAD CVS- RRR, no murmur RESP-CTAB Pulses- Radial 2+         Assessment & Plan:      Problem List Items Addressed This Visit    None    Visit Diagnoses    Acute frontal sinusitis, recurrence not specified    -  Primary   Treat with amoxicllin, significant swelling of turbinates, given depo Medrol 40mg  IM, start anti-histamine Nasacort.Cough post nasal drip   Relevant Medications   amoxicillin (AMOXIL) 875 MG tablet   loratadine (CLARITIN) 10 MG tablet   triamcinolone (NASACORT AQ) 55 MCG/ACT AERO nasal inhaler      Note: This dictation was prepared with Dragon dictation along with smaller phrase technology. Any transcriptional errors  that result from this process are unintentional.

## 2016-08-16 NOTE — Addendum Note (Signed)
Addended by: Sheral Flow on: 08/16/2016 09:44 AM   Modules accepted: Orders

## 2016-08-27 ENCOUNTER — Other Ambulatory Visit: Payer: Self-pay | Admitting: Family Medicine

## 2016-09-17 ENCOUNTER — Encounter: Payer: Self-pay | Admitting: Family Medicine

## 2016-09-17 ENCOUNTER — Ambulatory Visit (INDEPENDENT_AMBULATORY_CARE_PROVIDER_SITE_OTHER): Payer: Medicare HMO | Admitting: Family Medicine

## 2016-09-17 VITALS — BP 140/76 | HR 82 | Temp 98.2°F | Resp 14 | Ht 68.0 in | Wt 190.0 lb

## 2016-09-17 DIAGNOSIS — W57XXXA Bitten or stung by nonvenomous insect and other nonvenomous arthropods, initial encounter: Secondary | ICD-10-CM

## 2016-09-17 DIAGNOSIS — S40869S Insect bite (nonvenomous) of unspecified upper arm, sequela: Secondary | ICD-10-CM | POA: Diagnosis not present

## 2016-09-17 MED ORDER — DOXYCYCLINE HYCLATE 100 MG PO TABS: 100.0000 mg | ORAL_TABLET | Freq: Two times a day (BID) | ORAL | 0 refills | Status: DC

## 2016-09-17 NOTE — Progress Notes (Signed)
Subjective:    Patient ID: Brett [name], male    DOB: [birth year], 71 y.o.   MRN: [id number]  Patient presents for Tick Bite to L Axilla (x3 days- itchy area under arm where tick bite is located)   Pt here with tick bite to left axilla, has some itching after he pulled it off 3 days ago. No fever, no chills, no N/V  He was treated for tick borne releated symptoms back in July, He had myalgias, nausea after tick removed  He has had abscess and bull's eye rash in past with tick bites as well. This time he has some discomfort and redness into axilla, he believes tick came from his dog      Review Of Systems:  GEN- denies fatigue, fever, weight loss,weakness, recent illness HEENT- denies eye drainage, change in vision, nasal discharge, CVS- denies chest pain, palpitations RESP- denies SOB, cough, wheeze ABD- denies N/V, change in stools, abd pain GU- denies dysuria, hematuria, dribbling, incontinence MSK- denies joint pain, muscle aches, injury Neuro- denies headache, dizziness, syncope, seizure activity       Objective:    BP 140/76   Pulse 82   Temp 98.2 F (36.8 C) (Oral)   Resp 14   Ht 5\' 8"  (1.727 m)   Wt 190 lb (86.2 kg)   SpO2 98%   BMI 28.89 kg/m  GEN- NAD, alert and oriented x3 Neck- Supple, no LAD Skin- Left axilla- 2x3cm area of erythema, mild clearing at bite, mild induration, no fluctance, no lesions on back, chest  Shotty axilla node adjacent           Assessment & Plan:      Problem List Items Addressed This Visit    None    Visit Diagnoses    Tick bite, initial encounter    -  Primary   He is already reacting to bite, based on previous history start doxycycline BID x 10 days , call if symptoms progress despite treatment      Note: This dictation was prepared with Dragon dictation along with smaller phrase technology. Any transcriptional errors that result from this process are unintentional.

## 2016-09-17 NOTE — Patient Instructions (Signed)
F/U as needed

## 2016-09-20 ENCOUNTER — Encounter: Payer: Self-pay | Admitting: Family Medicine

## 2016-09-20 ENCOUNTER — Ambulatory Visit (INDEPENDENT_AMBULATORY_CARE_PROVIDER_SITE_OTHER): Payer: Medicare HMO | Admitting: Family Medicine

## 2016-09-20 VITALS — BP 158/84 | HR 68 | Temp 98.0°F | Resp 16 | Ht 68.0 in | Wt 186.0 lb

## 2016-09-20 DIAGNOSIS — W57XXXD Bitten or stung by nonvenomous insect and other nonvenomous arthropods, subsequent encounter: Secondary | ICD-10-CM

## 2016-09-20 DIAGNOSIS — E860 Dehydration: Secondary | ICD-10-CM | POA: Diagnosis not present

## 2016-09-20 DIAGNOSIS — R42 Dizziness and giddiness: Secondary | ICD-10-CM

## 2016-09-20 LAB — CBC
HCT: 44 % (ref 38.5–50.0)
Hemoglobin: 14.9 g/dL (ref 13.0–17.0)
MCH: 28.8 pg (ref 27.0–33.0)
MCHC: 33.9 g/dL (ref 32.0–36.0)
MCV: 85.1 fL (ref 80.0–100.0)
Platelets: 271 10*3/uL (ref 140–400)
RBC: 5.17 MIL/uL (ref 4.20–5.80)
RDW: 13.9 % (ref 11.0–15.0)
WBC: 9.5 10*3/uL (ref 3.8–10.8)

## 2016-09-20 LAB — COMPREHENSIVE METABOLIC PANEL
ALT: 20 U/L (ref 9–46)
AST: 17 U/L (ref 10–35)
Albumin: 4.1 g/dL (ref 3.6–5.1)
Alkaline Phosphatase: 49 U/L (ref 40–115)
BUN: 14 mg/dL (ref 7–25)
CO2: 25 mmol/L (ref 20–31)
Calcium: 8.8 mg/dL (ref 8.6–10.3)
Chloride: 105 mmol/L (ref 98–110)
Creat: 0.79 mg/dL (ref 0.70–1.25)
Glucose, Bld: 91 mg/dL (ref 70–99)
Potassium: 3.6 mmol/L (ref 3.5–5.3)
Sodium: 140 mmol/L (ref 135–146)
Total Bilirubin: 0.7 mg/dL (ref 0.2–1.2)
Total Protein: 6.4 g/dL (ref 6.1–8.1)

## 2016-09-20 MED ORDER — MECLIZINE HCL 12.5 MG PO TABS: 12.5000 mg | ORAL_TABLET | Freq: Three times a day (TID) | ORAL | 0 refills | Status: DC | PRN

## 2016-09-20 MED ORDER — PROMETHAZINE HCL 25 MG/ML IJ SOLN
25.0000 mg | Freq: Once | INTRAMUSCULAR | Status: AC
Start: 2016-09-20 — End: 2016-09-20
  Administered 2016-09-20: 25 mg via INTRAMUSCULAR

## 2016-09-20 MED ORDER — PROMETHAZINE HCL 12.5 MG PO TABS: 12.5000 mg | ORAL_TABLET | Freq: Three times a day (TID) | ORAL | 0 refills | Status: DC | PRN

## 2016-09-20 NOTE — Progress Notes (Signed)
Subjective:    Patient ID: Brett [name], male    DOB: [birth year], 71 y.o.   MRN: [id number]  Patient presents for Dizziness (x3 days- began Sat night with dizziness and fall- N/V loss of appetite- has not taken meds x2 days) Patient here with his wife. They called in a scheduled appointment as he has had severe dizziness loss of appetite the entire weekend. He was seen on Friday he had to tick bite in his left axilla which it occurred 3 days before. He did not have any fever or chills at that visit. I started him on doxycycline based on his previous history with ticks and lymes. He has had persistent N/V on and has not taken any meds since Saturday He also had a fall Sat night after a bout of dizziness,no LOC, felt room was spinning at that time  No fever, but has been a little clamy,  no diarrhea, no cough or congestion, no chest pain. No known sick contacts    Review Of Systems:  GEN- denies fatigue, fever, weight loss,weakness, recent illness HEENT- denies eye drainage, change in vision, nasal discharge, CVS- denies chest pain, palpitations RESP- denies SOB, cough, wheeze ABD- denies N/V, change in stools, abd pain GU- denies dysuria, hematuria, dribbling, incontinence MSK- denies joint pain, muscle aches, injury Neuro- denies headache, +dizziness, syncope, seizure activity       Objective:    BP (!) 158/84   Pulse 68   Temp 98 F (36.7 C) (Oral)   Resp 16   Ht 5\' 8"  (1.727 m)   Wt 186 lb (84.4 kg)   SpO2 98%   BMI 28.28 kg/m  GEN- NAD, alert and oriented x3 HEENT- PERRL, EOMI, non injected sclera, pink conjunctiva, dry MM oropharynx clear, no nystagumus,  Neck- Supple, no LAD, no stiffness  CVS- RRR, no murmur RESP-CTAB ABD-NABS,soft,mild TTP epigastric region, ND Skin- 2x3cm area of  Mild erythema in left axilla, no pustule, no vesicles, NT EXT- No edema Pulses- Radial  2+  CBC- normal   Unable to get IV multiple attempts      Assessment & Plan:       Problem List Items Addressed This Visit    None    Visit Diagnoses    Dizziness    -  Primary  DD is sequela due to tick bite and dehydration or possible viral etiology for just conicidental vertigo  treat with meclizine, phenergan for nausea    Relevant Medications   promethazine (PHENERGAN) injection 25 mg (Completed)   Other Relevant Orders   CBC   Comprehensive metabolic panel   Tick bite, subsequent encounter       Interesting the symptoms started with tick bite, no fever, but severe dizziness, GI upset, and now dehydrated. Tick area still has erythematous patch but improved With his history would like for him to continue doxy which he has been taking Okay to hold off other meds. He is taking omeprazole WBC normal today Await CMET   Relevant Orders   B. Burgdorfi Antibodies by WB   Dehydration       Unable to get IVF, given Phenergan IM for N/V, oral anti-emetic and meclizine for the dizziness.   Relevant Orders   Comprehensive metabolic panel      Note: This dictation was prepared with Dragon dictation along with smaller phrase technology. Any transcriptional errors that result from this process are unintentional.

## 2016-09-20 NOTE — Patient Instructions (Addendum)
Push fluids Your WBC are okay  Take the phenergan as directed for nausea  Take the meclizine for dizzness Try to complete the doxycycline F/Upending results

## 2016-09-22 ENCOUNTER — Telehealth: Payer: Self-pay | Admitting: Family Medicine

## 2016-09-22 ENCOUNTER — Emergency Department (HOSPITAL_COMMUNITY): Payer: Medicare HMO

## 2016-09-22 ENCOUNTER — Emergency Department (HOSPITAL_COMMUNITY)
Admission: EM | Admit: 2016-09-22 | Discharge: 2016-09-22 | Disposition: A | Discharge status: Home / Self Care | Payer: Medicare HMO | Attending: Emergency Medicine | Admitting: Emergency Medicine

## 2016-09-22 ENCOUNTER — Ambulatory Visit: Payer: Medicare HMO | Admitting: Family Medicine

## 2016-09-22 ENCOUNTER — Encounter (HOSPITAL_COMMUNITY): Payer: Self-pay | Admitting: *Deleted

## 2016-09-22 DIAGNOSIS — I10 Essential (primary) hypertension: Secondary | ICD-10-CM | POA: Diagnosis not present

## 2016-09-22 DIAGNOSIS — R42 Dizziness and giddiness: Secondary | ICD-10-CM | POA: Diagnosis not present

## 2016-09-22 DIAGNOSIS — Z87891 Personal history of nicotine dependence: Secondary | ICD-10-CM | POA: Insufficient documentation

## 2016-09-22 DIAGNOSIS — R404 Transient alteration of awareness: Secondary | ICD-10-CM | POA: Diagnosis not present

## 2016-09-22 DIAGNOSIS — Z79899 Other long term (current) drug therapy: Secondary | ICD-10-CM | POA: Diagnosis not present

## 2016-09-22 LAB — CBC WITH DIFFERENTIAL/PLATELET
Basophils Absolute: 0 10*3/uL (ref 0.0–0.1)
Basophils Relative: 0 %
Eosinophils Absolute: 0.3 10*3/uL (ref 0.0–0.7)
Eosinophils Relative: 4 %
HCT: 43.7 % (ref 39.0–52.0)
Hemoglobin: 14.3 g/dL (ref 13.0–17.0)
Lymphocytes Relative: 22 %
Lymphs Abs: 1.7 10*3/uL (ref 0.7–4.0)
MCH: 27.2 pg (ref 26.0–34.0)
MCHC: 32.7 g/dL (ref 30.0–36.0)
MCV: 83.2 fL (ref 78.0–100.0)
Monocytes Absolute: 0.5 10*3/uL (ref 0.1–1.0)
Monocytes Relative: 6 %
Neutro Abs: 5.3 10*3/uL (ref 1.7–7.7)
Neutrophils Relative %: 68 %
Platelets: 244 10*3/uL (ref 150–400)
RBC: 5.25 MIL/uL (ref 4.22–5.81)
RDW: 13.3 % (ref 11.5–15.5)
WBC: 7.9 10*3/uL (ref 4.0–10.5)

## 2016-09-22 LAB — BASIC METABOLIC PANEL
Anion gap: 7 (ref 5–15)
BUN: 7 mg/dL (ref 6–20)
CO2: 26 mmol/L (ref 22–32)
Calcium: 8.8 mg/dL — ABNORMAL LOW (ref 8.9–10.3)
Chloride: 104 mmol/L (ref 101–111)
Creatinine, Ser: 0.86 mg/dL (ref 0.61–1.24)
GFR calc Af Amer: >60 mL/min (ref >60)
GFR calc non Af Amer: >60 mL/min (ref >60)
Glucose, Bld: 118 mg/dL — ABNORMAL HIGH (ref 65–99)
Potassium: 3.2 mmol/L — ABNORMAL LOW (ref 3.5–5.1)
Sodium: 137 mmol/L (ref 135–145)

## 2016-09-22 MED ORDER — DIAZEPAM 5 MG PO TABS: 5.0000 mg | ORAL_TABLET | Freq: Every evening | ORAL | 0 refills | Status: DC | PRN

## 2016-09-22 MED ORDER — DIPHENHYDRAMINE HCL 50 MG/ML IJ SOLN
25.0000 mg | Freq: Once | INTRAMUSCULAR | Status: AC
  Administered 2016-09-22: 25 mg via INTRAVENOUS
  Filled 2016-09-22: qty 1

## 2016-09-22 MED ORDER — MECLIZINE HCL 25 MG PO TABS: 25.0000 mg | ORAL_TABLET | Freq: Three times a day (TID) | ORAL | 0 refills | Status: DC | PRN

## 2016-09-22 MED ORDER — ONDANSETRON 4 MG PO TBDP: 4.0000 mg | ORAL_TABLET | Freq: Three times a day (TID) | ORAL | 0 refills | Status: DC | PRN

## 2016-09-22 MED ORDER — SODIUM CHLORIDE 0.9 % IV BOLUS (SEPSIS)
1000.0000 mL | Freq: Once | INTRAVENOUS | Status: AC
  Administered 2016-09-22: 1000 mL via INTRAVENOUS

## 2016-09-22 MED ORDER — ONDANSETRON HCL 4 MG/2ML IJ SOLN
4.0000 mg | Freq: Once | INTRAMUSCULAR | Status: AC
  Administered 2016-09-22: 4 mg via INTRAVENOUS
  Filled 2016-09-22: qty 2

## 2016-09-22 NOTE — ED Notes (Signed)
Pt stable, understands discharge instructions, and reasons for return.   

## 2016-09-22 NOTE — Telephone Encounter (Signed)
Wife states that he is having tingling in arms. N&V. Was feeling better yesterday and then today he is getting much worse. With elevated BP. NO CP, no sob. Informed her to take him to ER and she states that she is going to call 911 as she is not going to take him herself cause she does not want to wait a long time in the ER. I informed her that if that was what she wanted to do that was fine but he needed to be evaluated. She verbalized understanding.

## 2016-09-22 NOTE — ED Provider Notes (Signed)
[facility] DEPT Provider Note   CSN: [id number] Arrival date & time: 09/22/16  1355     History   Chief Complaint Chief Complaint  Patient presents with  . Dizziness    HPI Brett [name] is a 71 y.o. male. Chief complaint is dizziness, and vomiting.  HPI:  71 year old male. His symptoms started on Friday, 5 days ago. He notes a red sore spot in his left axilla. He presumed he had been bitten by a tick although he did not find or remove the tick. [place] and states he has ticks on him frequently. Describes them as small "like that of the pen". Seen by his physician on Friday and placed on doxycycline. Saturday evening developed a sudden episode of dizziness and vomited he describes as "like a world was tilting and I was having trouble with my balance". This improved Sunday but recurred Monday. Seen by his primary care physician and asked to continue the doxycycline. Given Zofran and meclizine and has not improved. No headache. He felt some tingling in both fingers today in his left hand still feels numb area and his nausea is improved with lying still.  Past Medical History:  Diagnosis Date  . Allergy   . Cataract   . Hyperlipidemia   . Sleep apnea     Patient Active Problem List   Diagnosis Date Noted  . Essential hypertension 04/10/2014  . Insomnia due to stress 04/10/2014  . DDD (degenerative disc disease), cervical 04/10/2014  . Glucose intolerance (impaired glucose tolerance) 10/09/2013  . Hypertriglyceridemia 04/13/2013  . GERD (gastroesophageal reflux disease) 04/13/2013  . OSA on CPAP 04/13/2013  . Chronic shoulder pain 04/13/2013    Past Surgical History:  Procedure Laterality Date  . CHOLECYSTECTOMY  2004  . FRACTURE SURGERY  2006  . rhino plasty     deviated septum  . SHOULDER SURGERY  2011   rotator cuff  . VASECTOMY  1980       Home Medications    Prior to Admission medications   Medication Sig Start Date End Date Taking? Authorizing  Provider  colestipol (COLESTID) 1 g tablet TAKE 1 TABLET TWICE A DAY 08/02/16   [place], Modena Nunnery, MD  diazepam (VALIUM) 5 MG tablet Take 1 tablet (5 mg total) by mouth at bedtime as needed (vertigo). 09/22/16   Tanna Furry, MD  diclofenac (VOLTAREN) 75 MG EC tablet TAKE 1 TABLET TWICE A DAY 07/26/16   [place], Modena Nunnery, MD  doxycycline (VIBRA-TABS) 100 MG tablet Take 1 tablet (100 mg total) by mouth 2 (two) times daily. 09/17/16   [place], Modena Nunnery, MD  HYDROcodone-acetaminophen (NORCO) 5-325 MG tablet Take 1 tablet by mouth every 6 (six) hours as needed for moderate pain. 03/09/16   [place], Modena Nunnery, MD  loratadine (CLARITIN) 10 MG tablet Take 1 tablet (10 mg total) by mouth daily. 08/16/16   Alycia Rossetti, MD  meclizine (ANTIVERT) 25 MG tablet Take 1 tablet (25 mg total) by mouth 3 (three) times daily as needed. 09/22/16   Tanna Furry, MD  omeprazole (PRILOSEC) 20 MG capsule TAKE 1 CAPSULE DAILY 08/27/16   Alycia Rossetti, MD  tamsulosin (FLOMAX) 0.4 MG CAPS capsule Take 1 capsule (0.4 mg total) by mouth daily. 11/20/15   [place], Modena Nunnery, MD  tizanidine (ZANAFLEX) 2 MG capsule Take 1 capsule (2 mg total) by mouth 3 (three) times daily as needed for muscle spasms. 03/09/16   Alycia Rossetti, MD  triamcinolone (NASACORT AQ) 55  MCG/ACT AERO nasal inhaler Place 2 sprays into the nose 2 (two) times daily. 08/16/16   Alycia Rossetti, MD  zolpidem (AMBIEN) 10 MG tablet Take 1 tablet (10 mg total) by mouth at bedtime as needed for sleep. 01/21/16   Alycia Rossetti, MD    Family History Family History  Problem Relation Age of Onset  . Cancer Sister        uterine   . Cancer Maternal Grandmother     Social History Social History  Substance Use Topics  . Smoking status: Former Smoker    Quit date: 05/23/2010  . Smokeless tobacco: Never Used  . Alcohol use No     Allergies   Patient has no known allergies.   Review of Systems Review of Systems  Constitutional: Negative for appetite change,  chills, diaphoresis, fatigue and fever.  HENT: Negative for mouth sores, sore throat and trouble swallowing.   Eyes: Negative for visual disturbance.  Respiratory: Negative for cough, chest tightness, shortness of breath and wheezing.   Cardiovascular: Negative for chest pain.  Gastrointestinal: Positive for nausea. Negative for abdominal distention, abdominal pain, diarrhea and vomiting.  Endocrine: Negative for polydipsia, polyphagia and polyuria.  Genitourinary: Negative for dysuria, frequency and hematuria.  Musculoskeletal: Negative for gait problem.  Skin: Positive for rash. Negative for color change and pallor.  Neurological: Positive for dizziness. Negative for syncope, light-headedness and headaches.  Hematological: Does not bruise/bleed easily.  Psychiatric/Behavioral: Negative for behavioral problems and confusion.     Physical Exam Updated Vital Signs BP (!) 161/79   Pulse (!) 58   Temp 98.4 F (36.9 C) (Oral)   Resp 15   SpO2 97%   Physical Exam  Constitutional: He is oriented to person, place, and time. He appears well-developed and well-nourished. No distress.  HENT:  Head: Normocephalic.  Eyes: Conjunctivae are normal. Pupils are equal, round, and reactive to light. No scleral icterus.  Neck: Normal range of motion. Neck supple. No thyromegaly present.  Cardiovascular: Normal rate and regular rhythm.  Exam reveals no gallop and no friction rub.   No murmur heard. Pulmonary/Chest: Effort normal and breath sounds normal. No respiratory distress. He has no wheezes. He has no rales.  Abdominal: Soft. Bowel sounds are normal. He exhibits no distension. There is no tenderness. There is no rebound.  Musculoskeletal: Normal range of motion.  Neurological: He is alert and oriented to person, place, and time.  No cranial nerve deficits. No long track signs. Normal strength and sensation. He does not have inducible nystagmus.  Skin: Skin is warm and dry. No rash noted.    Axilla shows small erythema and no tenderness. No streaks. No palpable depth or fluid collections or induration.  Psychiatric: He has a normal mood and affect. His behavior is normal.     ED Treatments / Results  Labs (all labs ordered are listed, but only abnormal results are displayed) Labs Reviewed  BASIC METABOLIC PANEL - Abnormal; Notable for the following:       Result Value   Potassium 3.2 (*)    Glucose, Bld 118 (*)    Calcium 8.8 (*)    All other components within normal limits  CBC WITH DIFFERENTIAL/PLATELET    EKG  EKG Interpretation None       Radiology No results found.  Procedures Procedures (including critical care time)  Medications Ordered in ED Medications  sodium chloride 0.9 % bolus 1,000 mL (0 mLs Intravenous Stopped 09/22/16 1649)  diphenhydrAMINE (BENADRYL) injection  25 mg (25 mg Intravenous Given 09/22/16 1518)  ondansetron (ZOFRAN) injection 4 mg (4 mg Intravenous Given 09/22/16 1518)     Initial Impression / Assessment and Plan / ED Course  I have reviewed the triage vital signs and the nursing notes.  Pertinent labs & imaging results that were available during my care of the patient were reviewed by me and considered in my medical decision making (see chart for details).     Symptoms consistent with vertigo. With his symptoms of his left hand ordered MRI for rule out. I think is very likely acute peripheral vertigo. The doxycycline may be contributing to his GI symptoms but I think his overall and related. Asked him stop doxycycline. He has no signs of spotted fever. Normal palms and soles. No petechial rash. No fever. No headache or neck symptoms. Given IV Benadryl and Zofran. If MRI is negative, patient may be candidate for outpatient treatment with meclizine and Zofran.  Final Clinical Impressions(s) / ED Diagnoses   Final diagnoses:  Vertigo    New Prescriptions Discharge Medication List as of 09/22/2016  4:17 PM    START taking these  medications   Details  diazepam (VALIUM) 5 MG tablet Take 1 tablet (5 mg total) by mouth at bedtime as needed (vertigo)., Starting Wed 09/22/2016, Print    ondansetron (ZOFRAN ODT) 4 MG disintegrating tablet Take 1 tablet (4 mg total) by mouth every 8 (eight) hours as needed for nausea., Starting Wed 09/22/2016, Print         Tanna Furry, MD 09/28/16 [phone]

## 2016-09-22 NOTE — ED Triage Notes (Signed)
Patient presents to ed via [facility] states he found a tick in his left axilla on Friday, states he doesn't remember pulling it off prob fell off. States he saw his PCP on fri. Was started on Doxcyl. Sat started with dizziness and nausea, went back to pcp on Monday felt patient was dehydrated told hin to increased po intake was given meds for dizziness and nausea, states he felt better  Until 930 am today started with dizziness and nausea took his medication without relief. Denies blurred or double vision.

## 2016-09-22 NOTE — Discharge Instructions (Signed)
Stop doxycycline. Antivert every 4 hours as needed for vertigo. Take until 24 hours without symptoms. Zofran for nausea. Valium as needed at night for vertigo not relieved by above medications.

## 2016-09-22 NOTE — ED Provider Notes (Signed)
Blood pressure (!) 187/78, pulse (!) 51, temperature 98.4 F (36.9 C), temperature source Oral, resp. rate 15, SpO2 96 %.  Assuming care from Dr. Jeneen Rinks.  In short, Brett [name] is a 71 y.o. male with a chief complaint of Dizziness .  Refer to the original H&P for additional details.  The current plan of care is to follow MRI and reassess.  06:00 PM MRI resulted with no acute stroke. I reassessed the patient is feeling much better after IV fluids. Patient is being discharged with Antivert, Zofran, and Valium per Dr. Jeneen Rinks. Advised that he continue the doxycycline until he discusses this with his primary care physician.   At this time, I do not feel there is any life-threatening condition present. I have reviewed and discussed all results (EKG, imaging, lab, urine as appropriate), exam findings with patient. I have reviewed nursing notes and appropriate previous records.  I feel the patient is safe to be discharged home without further emergent workup. Discussed usual and customary return precautions. Patient and family (if present) verbalize understanding and are comfortable with this plan.  Patient will follow-up with their primary care provider. If they do not have a primary care provider, information for follow-up has been provided to them. All questions have been answered.  Nanda Quinton, MD    Margette Fast, MD 09/22/16 [phone]

## 2016-09-22 NOTE — ED Notes (Signed)
Pt ambulated to restroom located in room, tolerated well. 

## 2016-09-22 NOTE — Telephone Encounter (Signed)
Pt called in having numbness in left arm, bp 180's/80's, with dizziness.

## 2016-09-22 NOTE — ED Notes (Signed)
Patient transported to MRI 

## 2016-09-23 LAB — LYME ABY, WSTRN BLT IGG & IGM W/BANDS

## 2016-09-24 ENCOUNTER — Encounter: Payer: Self-pay | Admitting: Family Medicine

## 2016-09-24 ENCOUNTER — Ambulatory Visit (INDEPENDENT_AMBULATORY_CARE_PROVIDER_SITE_OTHER): Payer: Medicare HMO | Admitting: Family Medicine

## 2016-09-24 VITALS — BP 138/84 | HR 70 | Temp 98.7°F | Resp 16 | Ht 68.0 in | Wt 189.0 lb

## 2016-09-24 DIAGNOSIS — R42 Dizziness and giddiness: Secondary | ICD-10-CM | POA: Diagnosis not present

## 2016-09-24 DIAGNOSIS — R001 Bradycardia, unspecified: Secondary | ICD-10-CM | POA: Diagnosis not present

## 2016-09-24 DIAGNOSIS — E876 Hypokalemia: Secondary | ICD-10-CM | POA: Diagnosis not present

## 2016-09-24 NOTE — Patient Instructions (Addendum)
Stop doxycycline after tonight's dose Meclizine take three times a day for 3 days , then start Twice a day for 2 days , then once a day for 2 days then stop Continue all other medications Take valium if still severe at bedtime only  Heart Monitor to be worn  F/U AS PREVIOUS

## 2016-09-24 NOTE — Progress Notes (Signed)
Subjective:    Patient ID: Brett [name], male    DOB: 1945/08/19, 71 y.o.   MRN: [id number]  Patient presents for ER F/U Here for ER follow-up. See previous notations. He been seen after he had a tick bite he subsequently 24-hour Brett [name] developed dizziness with nausea and vomiting dehydration. He was seen on Monday and tends to get IV fluids but cannot get access. He did have CBC and metabolic panel done which did not show any acute abnormality. He was sent home with antiemetics as well as meclizine for the dizziness. I did advise that he continue the doxycycline as he had reaction in his axilla where he had his tick bite. He was improving for about 8 hours but then his dizziness and vomiting returned. His blood pressure is also elevated and he felt tingling into his left arm he was advised to go to the emergency room which he did. MRI was done which showed some small vessel changes but no acute abnormality. He was given IV fluid resuscitation his Lasix shows some mild hypokalemia. He was continued on meclizine as well as Zofran for nausea and advised to follow-up with [place] regarding the doxycycline. Advised him to take a total of 7 days and then discontinue. He is here today for follow-up symptoms as well as blood pressure. Blood pressure has been much better now, he is eating Difficulty but still has some dizzy spells. If he turns quickly or bends over he will have some dizziness. He is still taking the meclizine. He is also using Valium at nighttime as needed. He is concerned about his heart rate when he was in the emergency room his blood pressure was quite elevated however his heart rate stayed in the 50s. He does not have any chest pain. No EKG was done.  Note his Lyme antibodies did come back negative.    Review Of Systems:  GEN- denies fatigue, fever, weight loss,weakness, recent illness HEENT- denies eye drainage, change in vision, nasal discharge, CVS- denies chest pain, palpitations RESP-  denies SOB, cough, wheeze ABD- denies N/V, change in stools, abd pain GU- denies dysuria, hematuria, dribbling, incontinence MSK- denies joint pain, muscle aches, injury Neuro- denies headache, +dizziness, syncope, seizure activity       Objective:    BP 138/84   Pulse 70   Temp 98.7 F (37.1 C) (Oral)   Resp 16   Ht 5\' 8"  (1.727 m)   Wt 189 lb (85.7 kg)   SpO2 97%   BMI 28.74 kg/m  GEN- NAD, alert and oriented x3 HEENT- PERRL, EOMI, non injected sclera, pink conjunctiva, dry MM oropharynx clear, no nystagumus,  CVS- RRR, no murmur RESP-CTAB Neuro-CNII-XII intact no deficits  Skin- Left axilla no erythema EXT- No edema Pulses- Radial  2+  EKG- SInus bradycardia HR 59       Assessment & Plan:      Problem List Items Addressed This Visit    None    Visit Diagnoses    Bradycardia, unspecified    -  Primary   Concern for the bradycardia which appear to be transiet, will place him on holter monitor to see if this is occuring during the day, difficult to tell this week   Relevant Orders   EKG 12-Lead (Completed)   Basic metabolic panel   Vertigo       Improving, will start to taper meclizine, TID for 3 days, then BID x 2 days, then daily . COmplete doxycyline today for tick  bite   Hypokalemia       recheck potassium   Relevant Orders   Basic metabolic panel      Note: This dictation was prepared with Dragon dictation along with smaller phrase technology. Any transcriptional errors that result from this process are unintentional.

## 2016-09-25 LAB — BASIC METABOLIC PANEL
BUN: 10 mg/dL (ref 7–25)
CO2: 28 mmol/L (ref 20–31)
Calcium: 9 mg/dL (ref 8.6–10.3)
Chloride: 105 mmol/L (ref 98–110)
Creat: 0.94 mg/dL (ref 0.70–1.25)
Glucose, Bld: 123 mg/dL — ABNORMAL HIGH (ref 70–99)
Potassium: 3.4 mmol/L — ABNORMAL LOW (ref 3.5–5.3)
Sodium: 142 mmol/L (ref 135–146)

## 2016-09-27 ENCOUNTER — Ambulatory Visit: Payer: Medicare HMO | Admitting: Family Medicine

## 2016-09-28 ENCOUNTER — Ambulatory Visit (INDEPENDENT_AMBULATORY_CARE_PROVIDER_SITE_OTHER): Payer: Medicare HMO

## 2016-09-28 ENCOUNTER — Encounter: Payer: Self-pay | Admitting: Family Medicine

## 2016-09-28 DIAGNOSIS — R001 Bradycardia, unspecified: Secondary | ICD-10-CM

## 2016-09-28 NOTE — Progress Notes (Addendum)
Patient was seen to have a holter monitor put on. Provided with diary to make a note of abnormal activity with heart. Went over instuctions for wearing holter monitor  patient verbalized  Understanding.

## 2016-09-28 NOTE — Addendum Note (Signed)
Addended by: Vonna Kotyk A on: 09/28/2016 11:19 AM   Modules accepted: Orders

## 2016-09-29 ENCOUNTER — Ambulatory Visit: Payer: Medicare HMO | Admitting: Family Medicine

## 2016-09-29 DIAGNOSIS — R001 Bradycardia, unspecified: Secondary | ICD-10-CM

## 2016-09-29 NOTE — Progress Notes (Signed)
Holter monitor removed - monitor and paperwork replaced back in case. Call placed to [employer] for pick up. Tracking # [id number] and confirmation # [id number].

## 2016-10-05 ENCOUNTER — Encounter: Payer: Self-pay | Admitting: Family Medicine

## 2016-10-05 ENCOUNTER — Ambulatory Visit (INDEPENDENT_AMBULATORY_CARE_PROVIDER_SITE_OTHER): Payer: Medicare HMO | Admitting: Family Medicine

## 2016-10-05 VITALS — BP 132/78 | HR 84 | Temp 98.3°F | Resp 16 | Ht 68.0 in | Wt 189.0 lb

## 2016-10-05 DIAGNOSIS — L509 Urticaria, unspecified: Secondary | ICD-10-CM | POA: Diagnosis not present

## 2016-10-05 MED ORDER — METHYLPREDNISOLONE ACETATE 80 MG/ML IJ SUSP
80.0000 mg | Freq: Once | INTRAMUSCULAR | Status: AC
  Administered 2016-10-05: 80 mg via INTRAMUSCULAR

## 2016-10-05 MED ORDER — PREDNISONE 20 MG PO TABS: ORAL_TABLET | ORAL | 0 refills | Status: DC

## 2016-10-05 NOTE — Addendum Note (Signed)
Addended by: Sheral Flow on: 10/05/2016 10:12 AM   Modules accepted: Orders

## 2016-10-05 NOTE — Patient Instructions (Addendum)
Start prednisone taper Take benadryl at bedtime Call if not improved

## 2016-10-05 NOTE — Progress Notes (Signed)
Subjective:    Patient ID: Brett [name], male    DOB: 1945-12-28, 71 y.o.   MRN: [id number]  Patient presents for Skin Irritation (generlized redness to torso, back, legs- itching and warm to touch- has taken Benadryl x2- no head/ neck involvement)  Agent here with hives to start at 5:00 this morning. States that he was working out on his boat he was removing old hay and sticks that it been there for at least 20 years he's not sure if he got in contact with anything. He did not change anything that he has eaten recently no change in soap or detergent. The difficulty breathing no sore throat.   He took 2 Benadryl this morning which helped some. No he recently had tick bites he states that he pulled couple other ticks off of them. Recent Lyme antibodies which were negative. He did complete a course of doxycycline as well. He is also had vertigo and dizzy symptoms recently in the setting of his tick bite which that has improved. He now only has some dizziness when he sits up quickly or turns quickly. He is no longer using meclizine or Valium.  Her members having significant hives about 20 years ago he cannot find the cause at that time either.   Review Of Systems:  GEN- denies fatigue, fever, weight loss,weakness, recent illness HEENT- denies eye drainage, change in vision, nasal discharge, CVS- denies chest pain, palpitations RESP- denies SOB, cough, wheeze ABD- denies N/V, change in stools, abd pain GU- denies dysuria, hematuria, dribbling, incontinence MSK- denies joint pain, muscle aches, injury Neuro- denies headache, dizziness, syncope, seizure activity       Objective:    BP 132/78   Pulse 84   Temp 98.3 F (36.8 C) (Oral)   Resp 16   Ht 5\' 8"  (1.727 m)   Wt 189 lb (85.7 kg)   SpO2 95%   BMI 28.74 kg/m  GEN- NAD, alert and oriented x3 HEENT- PERRL, EOMI, non injected sclera, pink conjunctiva, MMM, oropharynx clear Neck- Supple, no LAD CVS- RRR, no  murmur RESP-CTAB Skin- generalized hives trunk, back, upper arms, bilat legs/groin to shins, no leions on palms/soles EXT- No edema Pulses- Radial, DP- 2+        Assessment & Plan:      Problem List Items Addressed This Visit    None    Visit Diagnoses    Hives of unknown origin    -  Primary   Depo Medrol 80mg  IM given in office, send with prednisone taper and benadryl at bedtime      Note: This dictation was prepared with Dragon dictation along with smaller phrase technology. Any transcriptional errors that result from this process are unintentional.

## 2016-10-07 DIAGNOSIS — M25651 Stiffness of right hip, not elsewhere classified: Secondary | ICD-10-CM | POA: Diagnosis not present

## 2016-10-15 ENCOUNTER — Telehealth: Payer: Self-pay

## 2016-10-15 NOTE — Telephone Encounter (Signed)
Patient called and is experiencing problems with vertigo he has been to the ED previously for the same problem and has taking his meclizine but is experiencing blurry vision. Patient was advised he could go back to the ED or sch an appointment with his provider on 10/18/2016 that was the next available appt. Pt states he will wait until 6/5 for the appointment.

## 2016-10-18 ENCOUNTER — Ambulatory Visit (INDEPENDENT_AMBULATORY_CARE_PROVIDER_SITE_OTHER): Payer: Medicare HMO | Admitting: Family Medicine

## 2016-10-18 ENCOUNTER — Encounter: Payer: Self-pay | Admitting: Family Medicine

## 2016-10-18 ENCOUNTER — Other Ambulatory Visit: Payer: Self-pay | Admitting: Family Medicine

## 2016-10-18 VITALS — Temp 97.6°F | Resp 16 | Ht 68.0 in | Wt 189.0 lb

## 2016-10-18 DIAGNOSIS — R42 Dizziness and giddiness: Secondary | ICD-10-CM

## 2016-10-18 MED ORDER — MECLIZINE HCL 25 MG PO TABS: 25.0000 mg | ORAL_TABLET | Freq: Two times a day (BID) | ORAL | 1 refills | Status: DC | PRN

## 2016-10-18 NOTE — Progress Notes (Signed)
Subjective:    Patient ID: Brett [name], male    DOB: Jan 01, 1946, 71 y.o.   MRN: [id number]  Patient presents for Dizziness/blurred vision  Patient here with recurrent dizzy spells. This diagnosis is vertigo. He was seen at the beginning of May after having episode of dizziness after he had a tick bite which was infected. He was on doxycycline. I started him on meclizine however his dizziness worsened and therefore he was sent to the emergency room. MRI of brain was done which was unremarkable. His labs are fairly unremarkable except for mild low potassium. He was placed on a higher dose of meclizine 25 mg and finally tapered off. Of note he also completed 7 days of doxycycline. He did have labs for Lyme disease after also negative. Since the beginning of May he's had intermittent episodes still becoming dizzy if he turns said a certain way up he bends over. He has not had any nausea vomiting. However this past weekend he began having episodes with some blurred vision depending on which way he would look. The episodes seem to be short lived at this time but he did go back to taking meclizine 25 mg once a day. At his last visit was on 522 at that time he developed hives after he was cleaning out an old bit. He was placed on prednisone that has resolved he did not have any dizziness at that time.  Review Of Systems:  GEN- denies fatigue, fever, weight loss,weakness, recent illness HEENT- denies eye drainage, change in vision, nasal discharge, CVS- denies chest pain, palpitations RESP- denies SOB, cough, wheeze ABD- denies N/V, change in stools, abd pain GU- denies dysuria, hematuria, dribbling, incontinence MSK- denies joint pain, muscle aches, injury Neuro- denies headache, +dizziness, syncope, seizure activity       Objective:    Temp 97.6 F (36.4 C) (Oral)   Resp 16   Ht 5\' 8"  (1.727 m)   Wt 189 lb (85.7 kg)   BMI 28.74 kg/m  GEN- NAD, alert and oriented x3 HEENT- PERRL, EOMI,  non injected sclera, pink conjunctiva,  oropharynx clear, no nystagumus, TM with wax obscurring  CVS- RRR, no murmur RESP-CTAB Neuro-CNII-XII intact no deficits  Skin-in tact EXT- No edema Pulses- Radial  2+       Assessment & Plan:      Problem List Items Addressed This Visit    None    Visit Diagnoses    Vertigo    -  Primary   Persistant episodes of more positional vertigo, but this is interferring with activities. THe blurry vision can be associated with vertigo. He denies diplopia. MRI benign  No sign of acute infection Will increase meclizine to 25mg  twice a day  Will get appt wit neurology for evaluation many benefit from [employer] Maneuver in office.     Relevant Orders   Ambulatory referral to Neurology      Note: This dictation was prepared with Dragon dictation along with smaller phrase technology. Any transcriptional errors that result from this process are unintentional.

## 2016-10-18 NOTE — Patient Instructions (Signed)
Referral to neurology

## 2016-10-18 NOTE — Telephone Encounter (Signed)
Refill appropriate 

## 2016-10-19 ENCOUNTER — Other Ambulatory Visit: Payer: Self-pay | Admitting: *Deleted

## 2016-10-19 ENCOUNTER — Other Ambulatory Visit: Payer: Self-pay | Admitting: Family Medicine

## 2016-10-19 MED ORDER — MECLIZINE HCL 25 MG PO TABS: 25.0000 mg | ORAL_TABLET | Freq: Two times a day (BID) | ORAL | 1 refills | Status: DC | PRN

## 2016-10-21 ENCOUNTER — Encounter: Payer: Self-pay | Admitting: Neurology

## 2016-10-21 ENCOUNTER — Ambulatory Visit (INDEPENDENT_AMBULATORY_CARE_PROVIDER_SITE_OTHER): Payer: Medicare HMO | Admitting: Neurology

## 2016-10-21 VITALS — BP 165/85 | HR 60 | Ht 68.0 in | Wt 186.0 lb

## 2016-10-21 DIAGNOSIS — R42 Dizziness and giddiness: Secondary | ICD-10-CM | POA: Diagnosis not present

## 2016-10-21 DIAGNOSIS — H8110 Benign paroxysmal vertigo, unspecified ear: Secondary | ICD-10-CM

## 2016-10-21 DIAGNOSIS — H6121 Impacted cerumen, right ear: Secondary | ICD-10-CM

## 2016-10-21 NOTE — Patient Instructions (Signed)
Unfortunately, dizziness is a very common complaint but is often not due to a primary neurological reason or single underlying medical problem. Often, there a combination of factors, that result in dizziness. This includes blood pressure fluctuations, medication side effects, blood sugar fluctuations, stress, vertigo, poor sleep with sleep deprivation, dehydration, and electrolyte disturbance or other metabolic and endocrinological reasons, meaning hormone related problems such as thyroid dysfunction.  For your vertigo symptoms, I recommend Physical therapy for vestibular rehab. I made a referral   Take to Dr. Buelah Manis about seeing an ENT. You have wax build up both ears, right more than left.   Your brain MRI looked benign, neurological exam is normal.

## 2016-10-21 NOTE — Progress Notes (Signed)
Subjective:    Patient ID: Brett [name] is a 71 y.o. male.  HPI     [name], MD, PhD [facility] [address] [place], [place] [address]  Dear Dr. Buelah Manis,   I saw your patient, Brett [name], upon your kind request in my neurologic clinic today for initial consultation of his dizziness. The patient is accompanied by his wife today. As you know, [name] is a 71 year old right-handed gentleman with an underlying medical history of OSA, reflux disease, hyperlipidemia, low back pain, arthritis and multiple joint areas, recent tick bite, for which she was treated with doxycycline for 1 week in May 2018 with negative testing for Lyme's disease, who reports episodic dizziness and vertiginous spells since beginning of May. He has also had some blurry vision recently. I reviewed your office note from 10/18/2016. I also reviewed the emergency room records from 09/22/2016. He has been on meclizine. He had a brain MRI without contrast on 09/22/2016 which I reviewed: IMPRESSION: 1. No acute finding.  Negative for posterior circulation infarct. 2. Mild opacification of left mastoid air cells. 3. Mild chronic small vessel ischemia. He feels that his symptoms started in the beginning of May. He recalls that he fell out of bed on 09/14/2016 as he was suddenly dizzy. Since then he has had intermittent brief episodes of dizzy spells and vertigo type symptoms that last for a few minutes at a time, not days or hours typically. He has not found meclizine to be helpful. He recently restarted it and currently takes 25 mg twice daily but does not find it helpful. Of note, he has a history of sleep apnea and uses a CPAP machine he had an eye exam about a year ago, he may be due for another checkup. He has intermittent wax buildup and sometimes tries to clean it with a bulb syringe. He was found to have wax buildup in your office the other day but was advised that trying to clean  this could exacerbate his vertigo. He does not report any tinnitus or hearing loss. He tries to get enough sleep about 7-8 hours. He tries to hydrate well with water. He quit smoking in 2004, he does not drink alcohol, he drinks caffeine, usually 3 cups of coffee per day, typically no sodas or tea. He is married and lives with his wife. He has 2 children. He is retired.  His Past Medical History Is Significant For: Past Medical History:  Diagnosis Date  . Allergy   . Cataract   . Hyperlipidemia   . Sleep apnea    His Past Surgical History Is Significant For: Past Surgical History:  Procedure Laterality Date  . CHOLECYSTECTOMY  2004  . FRACTURE SURGERY  2006  . rhino plasty     deviated septum  . SHOULDER SURGERY  2011   rotator cuff  . VASECTOMY  1980    His Family History Is Significant For: Family History  Problem Relation Age of Onset  . Cancer Sister        uterine   . Cancer Maternal Grandmother     His Social History Is Significant For: Social History   Social History  . Marital status: Married    Spouse name: N/A  . Number of children: N/A  . Years of education: N/A   Social History Main Topics  . Smoking status: Former Smoker    Quit date: 05/23/2010  . Smokeless tobacco: Never Used  . Alcohol use No  .  Drug use: No  . Sexual activity: Not Asked   Other Topics Concern  . None   Social History Narrative  . None    His Allergies Are:  No Known Allergies:   His Current Medications Are:  Outpatient Encounter Prescriptions as of 10/21/2016  Medication Sig  . colestipol (COLESTID) 1 g tablet TAKE 1 TABLET TWICE A DAY  . diclofenac (VOLTAREN) 75 MG EC tablet TAKE 1 TABLET TWICE A DAY  . meclizine (ANTIVERT) 25 MG tablet Take 1 tablet (25 mg total) by mouth 2 (two) times daily as needed.  Marland Kitchen omeprazole (PRILOSEC) 20 MG capsule TAKE 1 CAPSULE DAILY  . tamsulosin (FLOMAX) 0.4 MG CAPS capsule TAKE 1 CAPSULE DAILY  . [DISCONTINUED] diazepam (VALIUM) 5 MG tablet  Take 1 tablet (5 mg total) by mouth at bedtime as needed (vertigo).  . [DISCONTINUED] HYDROcodone-acetaminophen (NORCO) 5-325 MG tablet Take 1 tablet by mouth every 6 (six) hours as needed for moderate pain.  . [DISCONTINUED] loratadine (CLARITIN) 10 MG tablet Take 1 tablet (10 mg total) by mouth daily.  . [DISCONTINUED] tizanidine (ZANAFLEX) 2 MG capsule Take 1 capsule (2 mg total) by mouth 3 (three) times daily as needed for muscle spasms.  . [DISCONTINUED] triamcinolone (NASACORT AQ) 55 MCG/ACT AERO nasal inhaler Place 2 sprays into the nose 2 (two) times daily.  . [DISCONTINUED] zolpidem (AMBIEN) 10 MG tablet Take 1 tablet (10 mg total) by mouth at bedtime as needed for sleep.   No facility-administered encounter medications on file as of 10/21/2016.   : Review of Systems:  Out of a complete 14 point review of systems, all are reviewed and negative with the exception of these symptoms as listed below:  Review of Systems  Neurological:       Pt presents today to discuss his vertigo. Pt has had an MRI which was negative for strokes. Pt experiences vertigo when she changes positions.    Objective:  Neurologic Exam  Physical Exam Physical Examination:   Vitals:   10/21/16 1447 10/21/16 1448  BP: (!) 164/97 (!) 165/85  Pulse: 73 60   He denies any vertigo symptoms but had a brief episode of orthostatic dizziness with standing from the lying position. No orthostatic blood pressure drop, his blood pressure in fact stays a little high today. He denies any blurry vision No chest pain, no shortness breath, no double vision.  General Examination: The patient is a very pleasant 71 y.o. male in no acute distress. He appears well-developed and well-nourished and adequately groomed.   HEENT: Normocephalic, atraumatic, pupils are equal, round and reactive to light and accommodation. Funduscopic exam is normal with sharp disc margins noted. Extraocular tracking is good without limitation to gaze  excursion or nystagmus noted. Normal smooth pursuit is noted. Hearing is grossly intact. Tympanic membranes: partially obscured with cerumen on the left and almost completely obscured with more dry appearing cerumen on the right. Face is symmetric with normal facial animation. Speech is clear with no dysarthria noted. There is no hypophonia. There is no lip, neck/head, jaw or voice tremor. Neck is supple with full range of passive and active motion. There are no carotid bruits on auscultation. Oropharynx exam reveals: mild mouth dryness, adequate dental hygiene and moderate airway crowding. Mallampati is class II. Tongue protrudes centrally and palate elevates symmetrically. Of note, he has no vertiginous spell upon sudden changes of head position.  Chest: Clear to auscultation without wheezing, rhonchi or crackles noted.  Heart: S1+S2+0, regular and normal without  murmurs, rubs or gallops noted.   Abdomen: Soft, non-tender and non-distended with normal bowel sounds appreciated on auscultation.  Extremities: There is no pitting edema in the distal lower extremities bilaterally. Pedal pulses are intact.  Skin: Warm and dry without trophic changes noted.  Musculoskeletal: exam reveals no obvious joint deformities, tenderness or joint swelling or erythema.   Neurologically:  Mental status: The patient is awake, alert and oriented in all 4 spheres. His immediate and remote memory, attention, language skills and fund of knowledge are appropriate. There is no evidence of aphasia, agnosia, apraxia or anomia. Speech is clear with normal prosody and enunciation. Thought process is linear. Mood is normal and affect is normal.  Cranial nerves II - XII are as described above under HEENT exam. In addition: shoulder shrug is normal with equal shoulder height noted. Motor exam: Normal bulk, strength and tone is noted. There is no drift, tremor or rebound. Romberg is negative. Reflexes are 2+ throughout. Babinski:  Toes are flexor bilaterally. Fine motor skills and coordination: intact with normal finger taps, normal hand movements, normal rapid alternating patting, normal foot taps and normal foot agility.  Cerebellar testing: No dysmetria or intention tremor on finger to nose testing. Heel to shin is unremarkable bilaterally. There is no truncal or gait ataxia.  Sensory exam: intact to light touch, pinprick, vibration, temperature sense in the upper and lower extremities.  Gait, station and balance: He stands easily. No veering to one side is noted. No leaning to one side is noted. Posture is age-appropriate and stance is narrow based. Gait shows normal stride length and normal pace. No problems turning are noted. Tandem walk is unremarkable.  Assessment and Plan:  In summary, Ilijah Doucet is a very pleasant 71 y.o.-year old male with an underlying medical history of OSA, reflux disease, hyperlipidemia, low back pain, arthritis and multiple joint areas, recent tick bite, for which she was treated with doxycycline for 1 week in May 2018 with negative testing for Lyme's disease, who reports episodic dizziness and vertiginous spells since beginning of May.  His brain MRI recently was benign, neurological exam is nonfocal , the patient and his wife are reassured in that regard. I suggested physical therapy for vestibular rehabilitation. He is agreeable to this referral. Furthermore, he is advised to make sure he tries to clean his ears from the excess wax buildup. He may need ear irrigation for this. He has a bulb syringe at home but is advised to also use the peroxide-based eardrops that often come with a bulb syringe for home ear cleaning. Furthermore, he can consider seeing an ENT if the need arises and he is encouraged to discuss this with you. He has not benefited from meclizine and he is encouraged to stop it. He is advised to stay well hydrated and well rested and used his CPAP with full compliance. At this  juncture I suggested as needed follow-up with me. I answered all their questions today and the patient and his wife were in agreement.   Thank you very much for allowing me to participate in the care of this nice patient. If I can be of any further assistance to you please do not hesitate to call me at [phone].  Sincerely,   [name], MD, PhD

## 2016-10-27 DIAGNOSIS — M1611 Unilateral primary osteoarthritis, right hip: Secondary | ICD-10-CM | POA: Diagnosis not present

## 2016-11-01 ENCOUNTER — Ambulatory Visit (INDEPENDENT_AMBULATORY_CARE_PROVIDER_SITE_OTHER): Payer: Medicare HMO | Admitting: Family Medicine

## 2016-11-01 ENCOUNTER — Encounter: Payer: Self-pay | Admitting: Family Medicine

## 2016-11-01 VITALS — BP 144/82 | HR 58 | Temp 97.5°F | Resp 14 | Ht 68.0 in | Wt 188.0 lb

## 2016-11-01 DIAGNOSIS — Z0000 Encounter for general adult medical examination without abnormal findings: Secondary | ICD-10-CM

## 2016-11-01 DIAGNOSIS — N400 Enlarged prostate without lower urinary tract symptoms: Secondary | ICD-10-CM | POA: Diagnosis not present

## 2016-11-01 DIAGNOSIS — I10 Essential (primary) hypertension: Secondary | ICD-10-CM

## 2016-11-01 DIAGNOSIS — E781 Pure hyperglyceridemia: Secondary | ICD-10-CM

## 2016-11-01 DIAGNOSIS — Z125 Encounter for screening for malignant neoplasm of prostate: Secondary | ICD-10-CM

## 2016-11-01 LAB — LIPID PANEL
Cholesterol: 228 mg/dL — ABNORMAL HIGH (ref <200)
HDL: 40 mg/dL — ABNORMAL LOW (ref >40)
LDL Cholesterol: 159 mg/dL — ABNORMAL HIGH (ref <100)
Total CHOL/HDL Ratio: 5.7 Ratio — ABNORMAL HIGH (ref <5.0)
Triglycerides: 144 mg/dL (ref <150)
VLDL: 29 mg/dL (ref <30)

## 2016-11-01 NOTE — Patient Instructions (Signed)
F/U 6 months

## 2016-11-01 NOTE — Progress Notes (Signed)
Subjective:   Patient presents for Medicare Annual/Subsequent preventive examination.    Due for Repeat Lipid and PSA  BPH- flomax working well no concerns Vertigo- seen by neurology, vetigo is still diagnosis set up for Vestibular Rehab  Has form today for medical cleareance for his Hip replacement on Right side , planning for July, planning for shoulder repair in the fall  Declines Hep C testing   Review Past Medical/Family/Social: Per EMR    Risk Factors  Current exercise habits:  Walks ,active outside  Dietary issues discussed: Yes  Cardiac risk factors: HTN, Hyperlipidemia  Depression Screen  (Note: if answer to either of the following is "Yes", a more complete depression screening is indicated)  Over the past two weeks, have you felt down, depressed or hopeless? No Over the past two weeks, have you felt little interest or pleasure in doing things? No Have you lost interest or pleasure in daily life? No Do you often feel hopeless? No Do you cry easily over simple problems? No   Activities of Daily Living  In your present state of health, do you have any difficulty performing the following activities?:  Driving? No  Managing money? No  Feeding yourself? No  Getting from bed to chair? No  Climbing a flight of stairs? No  Preparing food and eating?: No  Bathing or showering? No  Getting dressed: No  Getting to the toilet? No  Using the toilet:No  Moving around from place to place: No  In the past year have you fallen or had a near fall?:yes  Are you sexually active? yes  Do you have more than one partner? No   Hearing Difficulties: No  Do you often ask people to speak up or repeat themselves? No  Do you experience ringing or noises in your ears? No Do you have difficulty understanding soft or whispered voices? No  Do you feel that you have a problem with memory? No Do you often misplace items? No  Do you feel safe at home? Yes  Cognitive Testing  Alert? Yes  Normal Appearance?Yes  Oriented to person? Yes Place? Yes  Time? Yes  Recall of three objects? Yes  Can perform simple calculations? Yes  Displays appropriate judgment?Yes  Can read the correct time from a watch face?Yes   List the Names of Other Physician/Practitioners you currently use: NEUROLOGY, Orthopedics     Screening Tests / Date Colonoscopy        Done  2014              Zostavax  - UTD Pneumonia- UTD Influenza Vaccine UTD Tetanus/tdap -utd  ROS: GEN- denies fatigue, fever, weight loss,weakness, recent illness HEENT- denies eye drainage, change in vision, nasal discharge, CVS- denies chest pain, palpitations RESP- denies SOB, cough, wheeze ABD- denies N/V, change in stools, abd pain GU- denies dysuria, hematuria, dribbling, incontinence MSK- + joint pain, muscle aches, injury Neuro- denies headache, occ dizziness, syncope, seizure activity   PHYSICAL: GEN- NAD, alert and oriented x3 HEENT- PERRL, EOMI, non injected sclera, pink conjunctiva, MMM, oropharynx clear Neck- Supple, no thryomegaly CVS- RRR, no murmur RESP-CTAB ABD-NABS,SOFT,NT,ND EXT- No edema Pulses- Radial, DP- 2+     Assessment:    Annual wellness medicare exam   Plan:    During the course of the visit the patient was educated and counseled about appropriate screening and preventive services including:  Note he declines further colon cancer screening  Cleared for surgical intervention on his HIP Screen Neg  for depression.   Check PSA and Cholesterol today   Recent CMET/CBC   Pt has living will   Diet review for nutrition referral? Yes ____ Not Indicated __x__  Patient Instructions (the written plan) was given to the patient.  Medicare Attestation  I have personally reviewed:  The patient's medical and social history  Their use of alcohol, tobacco or illicit drugs  Their current medications and supplements  The patient's functional ability including ADLs,fall risks, home safety  risks, cognitive, and hearing and visual impairment  Diet and physical activities  Evidence for depression or mood disorders  The patient's weight, height, BMI, and visual acuity have been recorded in the chart. I have made referrals, counseling, and provided education to the patient based on review of the above and I have provided the patient with a written personalized care plan for preventive services.

## 2016-11-02 LAB — PSA: PSA: 0.5 ng/mL (ref <=4.0)

## 2016-11-04 ENCOUNTER — Other Ambulatory Visit: Payer: Self-pay | Admitting: *Deleted

## 2016-11-04 ENCOUNTER — Ambulatory Visit: Payer: Self-pay | Admitting: Orthopedic Surgery

## 2016-11-04 MED ORDER — COLESTIPOL HCL 1 G PO TABS: 2.0000 g | ORAL_TABLET | Freq: Two times a day (BID) | ORAL | 1 refills | Status: DC

## 2016-11-08 ENCOUNTER — Ambulatory Visit: Payer: Medicare HMO | Attending: Neurology | Admitting: Physical Therapy

## 2016-11-08 ENCOUNTER — Encounter: Payer: Self-pay | Admitting: Physical Therapy

## 2016-11-08 DIAGNOSIS — R42 Dizziness and giddiness: Secondary | ICD-10-CM | POA: Diagnosis present

## 2016-11-08 DIAGNOSIS — R2681 Unsteadiness on feet: Secondary | ICD-10-CM | POA: Diagnosis present

## 2016-11-08 DIAGNOSIS — H8112 Benign paroxysmal vertigo, left ear: Secondary | ICD-10-CM

## 2016-11-08 NOTE — Therapy (Signed)
[facility] [address] [place], [place], [address] Phone: [phone]   Fax:  [phone]  Physical Therapy Evaluation  Patient Details  Name: Brett [name] MRN: [id number] Date of Birth: 06-09-1945 Referring Provider: Huston Foley, MD  Encounter Date: 11/08/2016      PT End of Session - 11/08/16 1232    Visit Number 1   Number of Visits 4   Date for PT Re-Evaluation 12/08/16   Authorization Type Medicare primary; secondary A[employer] INDEMNITY.  G Code   PT Start Time 1147   PT Stop Time 1226   PT Time Calculation (min) 39 min   Activity Tolerance Patient tolerated treatment well   Behavior During Therapy WFL for tasks assessed/performed      Past Medical History:  Diagnosis Date  . Allergy   . Cataract   . Hyperlipidemia   . Sleep apnea     Past Surgical History:  Procedure Laterality Date  . CHOLECYSTECTOMY  2004  . FRACTURE SURGERY  2006  . rhino plasty     deviated septum  . SHOULDER SURGERY  2011   rotator cuff  . VASECTOMY  1980    There were no vitals filed for this visit.       Subjective Assessment - 11/08/16 1150    Subjective Pt reports sudden acute onset of vertigo getting up to go to bathroom with nausea and vomiting and had to visit ED due to dehydration and to assess for CVA.  Pt was referred to neurology who referred him to vestibular rehab/PT.  Also had a tick bite a month ago with 10 day course of antibiotics; had only been taking antibiotic one day when acute vertigo occured.  Pt still has dizziness when getting OOB and when turning his head certain directions.     Patient is accompained by: Family member   Pertinent History Scheduled to have hip replacement in July   Patient Stated Goals To get rid of dizziness   Currently in Pain? No/denies            [facility] PT Assessment - 11/08/16 1157      Assessment   Medical Diagnosis Vertigo    Referring Provider Huston Foley, MD   Onset Date/Surgical Date 10/08/16   Prior Therapy orthopedic PT     Balance Screen   Has the patient fallen in the past 6 months Yes   How many times? 1   Has the patient had a decrease in activity level because of a fear of falling?  No   Is the patient reluctant to leave their home because of a fear of falling?  No     Home [employer]ist information centre manager residence   Living Arrangements Spouse/significant other   Type of Home House   Home Access Stairs to enter   Entrance Stairs-Number of Steps 3   Entrance Stairs-Rails Right;Left   Home Layout One level   Additional Comments house is fully handicap accessible     Prior Function   Level of Independence Independent     Observation/Other Assessments   Focus on Therapeutic Outcomes (FOTO)  97 (3% limited)   Other Surveys  Other Surveys   Dizziness Handicap Inventory [facility])  12            Vestibular Assessment - 11/08/16 1200      Vestibular Assessment   General Observation No dizziness sitting at rest; reports blurring vision just before he gets dizzy  Symptom Behavior   Type of Dizziness Spinning   Frequency of Dizziness every night   Duration of Dizziness short duration   Aggravating Factors Supine to sit;Rolling to right;Rolling to left;Lying supine;Sitting with head tilted back   Relieving Factors Head stationary     Occulomotor Exam   Occulomotor Alignment Normal   Spontaneous Absent   Gaze-induced Absent   Smooth Pursuits Intact   Saccades Poor trajectory   Comment Convergence intact     Vestibulo-Occular Reflex   VOR 1 Head Only (x 1 viewing) Normal   Comment HIT: negative bilaterally     Positional Testing   Dix-Hallpike Dix-Hallpike Right;Dix-Hallpike Left   Horizontal Canal Testing Horizontal Canal Right;Horizontal Canal Left     Dix-Hallpike Right   Dix-Hallpike Right Duration 0    Dix-Hallpike Right Symptoms No nystagmus     Dix-Hallpike Left   Dix-Hallpike Left Duration 15  seconds   Dix-Hallpike Left Symptoms Upbeat, left rotatory nystagmus        Objective measurements completed on examination: See above findings.           Vestibular Treatment/Exercise - 11/08/16 1217      Vestibular Treatment/Exercise   Vestibular Treatment Provided Canalith Repositioning   Canalith Repositioning Epley Manuever Left      EPLEY MANUEVER LEFT   Number of Reps  2   Overall Response  Symptoms Resolved               PT Education - 11/08/16 1232    Education provided Yes   Education Details BPPV   Person(s) Educated Patient;Spouse   Methods Explanation   Comprehension Verbalized understanding             PT Long Term Goals - 11/08/16 1246      PT LONG TERM GOAL #1   Title (TARGET DATE FOR LTG 12/08/16)  Pt will report 0/10 dizziness with supine <> sit and tilting head back in seated position   Time 4   Period Weeks   Status New     PT LONG TERM GOAL #2   Title Pt will demonstrate negative positional testing (hallpike-dix and roll test) bilaterally   Time 4   Period Weeks   Status New     PT LONG TERM GOAL #3   Title Pt will demonstrated improved confidence with all functional mobility as indicated by a decrease in DHI score by 10 points.     Baseline 12   Time 4   Period Weeks   Status New     PT LONG TERM GOAL #4   Title Pt and wife will demonstrate independence with home Epley for self-repositioning    Time 4   Period Weeks   Status New                Plan - 11/08/16 1234    Clinical Impression Statement Pt is a 71 year old male presenting to OPPT neuro for low complexity PT evaluation for acute vertigo. Pt's PMH significant for the following: recent fall, low back pain, recent tick bite, sleep apnea and OA with upcoming hip and shoulder replacement surgeries. The following deficits were noted during pt's exam: L rotary, upbeating nystagmus and vertigo with L hallpike-dix indicating L posterior canal canalithiasis and  impaired balance placing pt at risk for further falls. Pt's condition is stable and would benefit from skilled PT to address these impairments and functional limitations to maximize functional mobility independence and reduce falls risk.   History  and Personal Factors relevant to plan of care: OSA, pain, sleep apnea, tick bite, recent fall   Clinical Presentation Stable   Clinical Presentation due to: Dizziness and imbalance   Clinical Decision Making Low   Rehab Potential Excellent   Clinical Impairments Affecting Rehab Potential OA in R shoulder, low back pain   PT Frequency 1x / week   PT Duration 4 weeks   PT Treatment/Interventions Canalith Repostioning;Neuromuscular re-education;Patient/family education;Therapeutic activities;Vestibular   PT Next Visit Plan re-assess for L posterior BPPV, treat if indicated.  If cleared teach home self CRM and provide educational handouts on BPPV.  D/C if symptoms are cleared.  REASSESS DHI WITH FOTO   PT Home Exercise Plan Home Epley self treatment   Consulted and Agree with Plan of Care Patient;Family member/caregiver   Family Member Consulted wife      Patient will benefit from skilled therapeutic intervention in order to improve the following deficits and impairments:  Dizziness, Decreased balance  Visit Diagnosis: BPPV (benign paroxysmal positional vertigo), left  Dizziness and giddiness  Unsteadiness on feet      G-Codes - Nov 11, 2016 1252    Functional Assessment Tool Used (Outpatient Only) DHI; clinical assessment   Functional Limitation Changing and maintaining body position   Changing and Maintaining Body Position Current Status (U9811) At least 1 percent but less than 20 percent impaired, limited or restricted   Changing and Maintaining Body Position Goal Status (B1478) 0 percent impaired, limited or restricted       Problem List Patient Active Problem List   Diagnosis Date Noted  . Essential hypertension 04/10/2014  . Insomnia  due to stress 04/10/2014  . DDD (degenerative disc disease), cervical 04/10/2014  . Glucose intolerance (impaired glucose tolerance) 10/09/2013  . Hypertriglyceridemia 04/13/2013  . GERD (gastroesophageal reflux disease) 04/13/2013  . OSA on CPAP 04/13/2013  . Chronic shoulder pain 04/13/2013    Edman Circle, PT, DPT 11/11/2016    12:53 PM     [facility] [address] [place], [place], [address] Phone: [phone]   Fax:  [phone]  Name: Jamarkus Hymer MRN: [id number] Date of Birth: [birth year]

## 2016-11-22 ENCOUNTER — Ambulatory Visit: Payer: Medicare HMO | Attending: Neurology | Admitting: Physical Therapy

## 2016-11-22 DIAGNOSIS — H8112 Benign paroxysmal vertigo, left ear: Secondary | ICD-10-CM | POA: Insufficient documentation

## 2016-11-22 NOTE — Therapy (Signed)
[facility] [address] [place], [place], [address] Phone: [phone]   Fax:  [phone]  Physical Therapy Treatment  Patient Details  Name: Brett [name] MRN: [id number] Date of Birth: 1945-07-03 Referring Provider: Huston Foley, MD  Encounter Date: 11/22/2016      PT End of Session - 11/22/16 2052    Visit Number 2   Number of Visits 4   Date for PT Re-Evaluation 12/08/16   Authorization Type Medicare primary; secondary AET[employer] INDEMNITY.  G Code   PT Start Time 1105   PT Stop Time 1130  session ended early due to pt c/o nausea   PT Time Calculation (min) 25 min      Past Medical History:  Diagnosis Date  . Allergy   . Cataract   . Hyperlipidemia   . Sleep apnea     Past Surgical History:  Procedure Laterality Date  . CHOLECYSTECTOMY  2004  . FRACTURE SURGERY  2006  . rhino plasty     deviated septum  . SHOULDER SURGERY  2011   rotator cuff  . VASECTOMY  1980    There were no vitals filed for this visit.      Subjective Assessment - 11/22/16 2048    Subjective Pt reports the vertigo seems to be resolved but states he is having nausea this morning due to tick bite  -  "it's not the vertigo"   Patient is accompained by: Family member   Pertinent History Scheduled to have hip replacement in July   Patient Stated Goals To get rid of dizziness   Currently in Pain? No/denies           Neuro Re-ed:  (-) Lt Dix-Hallpike test with no nystagmus and no c/o vertigo - Lt BPPV appears to be resolved at this time  Self care:  Pt instructed in Brandt-Daroff and in Epley maneuver for self treatment of BPPV should he have a re-occurrence of BPPV   Pt requests to be placed on HOLD for 30 days to determine if BPPV re-occurs and to return to PT if necessary without having to obtain a new referral                   PT Education - 11/22/16 2051    Education provided Yes   Education Details  Brandt-Daroff exercises and picture of Epley maneuver for self-treatment prn for re-occurrence of BPPV   Person(s) Educated Patient;Spouse   Methods Explanation;Demonstration;Handout   Comprehension Verbalized understanding  pt declined demonstration due to c/o nausea             PT Long Term Goals - 11/08/16 1246      PT LONG TERM GOAL #1   Title (TARGET DATE FOR LTG 12/08/16)  Pt will report 0/10 dizziness with supine <> sit and tilting head back in seated position   Time 4   Period Weeks   Status New     PT LONG TERM GOAL #2   Title Pt will demonstrate negative positional testing (hallpike-dix and roll test) bilaterally   Time 4   Period Weeks   Status New     PT LONG TERM GOAL #3   Title Pt will demonstrated improved confidence with all functional mobility as indicated by a decrease in DHI score by 10 points.     Baseline 12   Time 4   Period Weeks   Status New     PT LONG TERM GOAL #4  Title Pt and wife will demonstrate independence with home Epley for self-repositioning    Time 4   Period Weeks   Status New               Plan - 26-Nov-2016 2055/04/10    Clinical Impression Statement Pt has negative Lt Dix-Hallpike test indicative of resolution of Lt BPPV and no c/o vertigo at this time.  Pt does report nausea which he attributes to recent tick bite and denies any relation of the nausea to the vertigo at this time.  Pt requests to be placed on HOLD for 30 days so that he may receive treatment if needed due to reoccurence of BPPV.     Rehab Potential Excellent   Clinical Impairments Affecting Rehab Potential OA in R shoulder, low back pain   PT Frequency 1x / week   PT Duration 4 weeks   PT Treatment/Interventions Canalith Repostioning;Neuromuscular re-education;Patient/family education;Therapeutic activities;Vestibular   PT Next Visit Plan hold for 30 days - D/C if pt does not return   PT Home Exercise Plan Home Epley self treatment   Consulted and Agree with  Plan of Care Patient;Family member/caregiver   Family Member Consulted wife      Patient will benefit from skilled therapeutic intervention in order to improve the following deficits and impairments:  Dizziness, Decreased balance  Visit Diagnosis: BPPV (benign paroxysmal positional vertigo), left       G-Codes - November 26, 2016 04/09/01    Functional Assessment Tool Used (Outpatient Only) DHI; clinical assessment   Functional Limitation Changing and maintaining body position   Changing and Maintaining Body Position Goal Status (F6213) 0 percent impaired, limited or restricted   Changing and Maintaining Body Position Discharge Status (Y8657) 0 percent impaired, limited or restricted      Problem List Patient Active Problem List   Diagnosis Date Noted  . Essential hypertension 04/10/2014  . Insomnia due to stress 04/10/2014  . DDD (degenerative disc disease), cervical 04/10/2014  . Glucose intolerance (impaired glucose tolerance) 10/09/2013  . Hypertriglyceridemia 04/13/2013  . GERD (gastroesophageal reflux disease) 04/13/2013  . OSA on CPAP 04/13/2013  . Chronic shoulder pain 04/13/2013    Brett [name], PT 11/26/16, 9:10 PM  [facility] [address] [place], [place], [address] Phone: [phone]   Fax:  [phone]  Name: Brett [name] MRN: [id number] Date of Birth: 1946/03/12

## 2016-11-22 NOTE — Patient Instructions (Signed)
Sit to Side-Lying    Sit on edge of bed. 1. Turn head 45 to right. 2. Maintain head position and lie down slowly on left side. Hold until symptoms subside. 3. Sit up slowly. Hold until symptoms subside. 4. Turn head 45 to left. 5. Maintain head position and lie down slowly on right side. Hold until symptoms subside. 6. Sit up slowly. Repeat sequence __5__ times per session. Do _3-5 sessions per day. Copyright  VHI. All rights reserved.  Self Treatment for Left Posterior / Anterior Canalithiasis    Sitting on bed: 1. Turn head 45 left. (a) Lie back slowly, shoulders on pillow, head on bed. (b) Hold __30__ seconds. 2. Keeping head on bed, turn head 90 right. Hold _30___ seconds. 3. Roll to right, head on 45 angle down toward bed. Hold __30_ seconds. 4. Sit up on right side of bed. Repeat __3__ times per session. Do __1__ sessions per day.  Copyright  VHI. All rights reserved.

## 2016-11-25 ENCOUNTER — Ambulatory Visit: Payer: Self-pay | Admitting: Orthopedic Surgery

## 2016-11-25 NOTE — H&P (Signed)
TOTAL HIP ADMISSION H&P  Patient is admitted for right total hip arthroplasty.  Subjective:  Chief Complaint: right hip pain  HPI: Brett [name], 71 y.o. male, has a history of pain and functional disability in the right hip(s) due to arthritis and patient has failed non-surgical conservative treatments for greater than 12 weeks to include NSAID's and/or analgesics, corticosteriod injections, flexibility and strengthening excercises, use of assistive devices, weight reduction as appropriate and activity modification.  Onset of symptoms was gradual starting 2 years ago with gradually worsening course since that time.The patient noted no past surgery on the right hip(s).  Patient currently rates pain in the right hip at 10 out of 10 with activity. Patient has night pain, worsening of pain with activity and weight bearing, trendelenberg gait, pain that interfers with activities of daily living, pain with passive range of motion and crepitus. Patient has evidence of subchondral cysts, subchondral sclerosis, periarticular osteophytes and joint space narrowing by imaging studies. This condition presents safety issues increasing the risk of falls.  There is no current active infection.  Patient Active Problem List   Diagnosis Date Noted  . Essential hypertension 04/10/2014  . Insomnia due to stress 04/10/2014  . DDD (degenerative disc disease), cervical 04/10/2014  . Glucose intolerance (impaired glucose tolerance) 10/09/2013  . Hypertriglyceridemia 04/13/2013  . GERD (gastroesophageal reflux disease) 04/13/2013  . OSA on CPAP 04/13/2013  . Chronic shoulder pain 04/13/2013   Past Medical History:  Diagnosis Date  . Allergy   . Cataract   . Hyperlipidemia   . Sleep apnea     Past Surgical History:  Procedure Laterality Date  . CHOLECYSTECTOMY  2004  . FRACTURE SURGERY  2006  . rhino plasty     deviated septum  . SHOULDER SURGERY  2011   rotator cuff  . VASECTOMY  1980     (Not in a  hospital admission) No Known Allergies  Social History  Substance Use Topics  . Smoking status: Former Smoker    Quit date: 05/23/2010  . Smokeless tobacco: Never Used  . Alcohol use No    Family History  Problem Relation Age of Onset  . Cancer Sister        uterine   . Cancer Maternal Grandmother      Review of Systems  Constitutional: Negative.   HENT: Negative.   Eyes: Negative.   Respiratory: Negative.   Cardiovascular: Negative.   Gastrointestinal: Negative.   Genitourinary: Negative.   Musculoskeletal: Positive for joint pain.  Skin: Positive for rash.  Neurological: Negative.   Endo/Heme/Allergies: Positive for environmental allergies.  Psychiatric/Behavioral: Negative.     Objective:  Physical Exam  Vitals reviewed. Constitutional: He is oriented to person, place, and time. He appears well-developed and well-nourished.  HENT:  Head: Normocephalic and atraumatic.  Eyes: Pupils are equal, round, and reactive to light. Conjunctivae and EOM are normal.  Neck: Normal range of motion. Neck supple.  Cardiovascular: Normal rate, regular rhythm and intact distal pulses.   Respiratory: Effort normal. No respiratory distress.  GI: Soft. He exhibits no distension.  Genitourinary:  Genitourinary Comments: deferred  Musculoskeletal:       Right hip: He exhibits decreased range of motion, bony tenderness and crepitus.  Neurological: He is alert and oriented to person, place, and time. He has normal reflexes.  Skin: Skin is warm and dry.  Psychiatric: He has a normal mood and affect. His behavior is normal. Judgment and thought content normal.    Vital signs in  last 24 hours: @VSRANGES @  Labs:   Estimated body mass index is 28.59 kg/m as calculated from the following:   Height as of 11/01/16: 5\' 8"  (1.727 m).   Weight as of 11/01/16: 85.3 kg (188 lb).   Imaging Review Plain radiographs demonstrate severe degenerative joint disease of the right hip(s). The bone  quality appears to be adequate for age and reported activity level.  Assessment/Plan:  End stage arthritis, right hip(s)  The patient history, physical examination, clinical judgement of the provider and imaging studies are consistent with end stage degenerative joint disease of the right hip(s) and total hip arthroplasty is deemed medically necessary. The treatment options including medical management, injection therapy, arthroscopy and arthroplasty were discussed at length. The risks and benefits of total hip arthroplasty were presented and reviewed. The risks due to aseptic loosening, infection, stiffness, dislocation/subluxation,  thromboembolic complications and other imponderables were discussed.  The patient acknowledged the explanation, agreed to proceed with the plan and consent was signed. Patient is being admitted for inpatient treatment for surgery, pain control, PT, OT, prophylactic antibiotics, VTE prophylaxis, progressive ambulation and ADL's and discharge planning.The patient is planning to be discharged home with HEP

## 2016-12-02 ENCOUNTER — Encounter (HOSPITAL_COMMUNITY): Payer: Self-pay

## 2016-12-02 ENCOUNTER — Encounter (HOSPITAL_COMMUNITY)
Admission: RE | Admit: 2016-12-02 | Discharge: 2016-12-02 | Disposition: A | Discharge status: Home / Self Care | Payer: Medicare HMO | Source: Ambulatory Visit | Attending: Orthopedic Surgery | Admitting: Orthopedic Surgery

## 2016-12-02 DIAGNOSIS — Z01812 Encounter for preprocedural laboratory examination: Secondary | ICD-10-CM | POA: Insufficient documentation

## 2016-12-02 DIAGNOSIS — M1611 Unilateral primary osteoarthritis, right hip: Secondary | ICD-10-CM | POA: Insufficient documentation

## 2016-12-02 HISTORY — DX: Nausea with vomiting, unspecified: R11.2

## 2016-12-02 HISTORY — DX: Unspecified osteoarthritis, unspecified site: M19.90

## 2016-12-02 HISTORY — DX: Personal history of other diseases of the digestive system: Z87.19

## 2016-12-02 HISTORY — DX: Pneumonia, unspecified organism: J18.9

## 2016-12-02 HISTORY — DX: Other specified postprocedural states: Z98.890

## 2016-12-02 HISTORY — DX: Gastro-esophageal reflux disease without esophagitis: K21.9

## 2016-12-02 LAB — CBC
HCT: 45.4 % (ref 39.0–52.0)
Hemoglobin: 15 g/dL (ref 13.0–17.0)
MCH: 28 pg (ref 26.0–34.0)
MCHC: 33 g/dL (ref 30.0–36.0)
MCV: 84.9 fL (ref 78.0–100.0)
Platelets: 228 10*3/uL (ref 150–400)
RBC: 5.35 MIL/uL (ref 4.22–5.81)
RDW: 13.3 % (ref 11.5–15.5)
WBC: 7.9 10*3/uL (ref 4.0–10.5)

## 2016-12-02 LAB — TYPE AND SCREEN
ABO/RH(D): A POS
Antibody Screen: NEGATIVE

## 2016-12-02 LAB — BASIC METABOLIC PANEL
Anion gap: 9 (ref 5–15)
BUN: 10 mg/dL (ref 6–20)
CO2: 26 mmol/L (ref 22–32)
Calcium: 9 mg/dL (ref 8.9–10.3)
Chloride: 104 mmol/L (ref 101–111)
Creatinine, Ser: 0.87 mg/dL (ref 0.61–1.24)
GFR calc Af Amer: >60 mL/min (ref >60)
GFR calc non Af Amer: >60 mL/min (ref >60)
Glucose, Bld: 100 mg/dL — ABNORMAL HIGH (ref 65–99)
Potassium: 4 mmol/L (ref 3.5–5.1)
Sodium: 139 mmol/L (ref 135–145)

## 2016-12-02 LAB — SURGICAL PCR SCREEN
MRSA, PCR: NEGATIVE
Staphylococcus aureus: NEGATIVE

## 2016-12-02 LAB — ABO/RH: ABO/RH(D): A POS

## 2016-12-02 NOTE — Progress Notes (Signed)
PCP: Dr. Buelah Manis  Last sleep study 10 yrs. Ago

## 2016-12-02 NOTE — Pre-Procedure Instructions (Signed)
Brett [name]  12/02/2016      [id number] Brett [name], [place] - [address] [place] [address] Phone: [phone] Fax: [phone]  [facility] - Brett [name], [place] [address] Phone: [phone] Fax: [phone]  [facility] Brett [name], [name] [address] [place] [address] Phone: [phone] Fax: [phone]    Your procedure is scheduled on Mon. July 30  Report to [facility] Admitting at 5:30 A.M.  Call this number if you have problems the morning of surgery:  [phone]   Remember:  Do not eat food or drink liquids after midnight Sun. July 29   Take these medicines the morning of surgery with A SIP OF WATER :omeprazole(prilosec), tamsulosin (flomax)             1 week prior to surgery stop :advil, motrin, ibuprofen, aleve, diclofenac (voltaren), Goody's, BC Powders, vitamins and herbal medicines.   Do not wear jewelry, make-up or nail polish.  Do not wear lotions, powders, or perfumes, or deoderant.  Do not shave 48 hours prior to surgery.  Men may shave face and neck.  Do not bring valuables to the hospital.  [name] is not responsible for any belongings or valuables.  Contacts, dentures or bridgework may not be worn into surgery.  Leave your suitcase in the car.  After surgery it may be brought to your room.  For patients admitted to the hospital, discharge time will be determined by your treatment team.  Patients discharged the day of surgery will not be allowed to drive home.     Special instructions:   Brett [name]- Preparing For Surgery  Before surgery, you can play an important role. Because skin is not sterile, your skin needs to be as free of germs as possible. You can reduce the number of germs on your skin by washing with CHG (chlorahexidine  gluconate) Soap before surgery.  CHG is an antiseptic cleaner which kills germs and bonds with the skin to continue killing germs even after washing.  Please do not use if you have an allergy to CHG or antibacterial soaps. If your skin becomes reddened/irritated stop using the CHG.  Do not shave (including legs and underarms) for at least 48 hours prior to first CHG shower. It is OK to shave your face.  Please follow these instructions carefully.   1. Shower the NIGHT BEFORE SURGERY and the MORNING OF SURGERY with CHG.   2. If you chose to wash your hair, wash your hair first as usual with your normal shampoo.  3. After you shampoo, rinse your hair and body thoroughly to remove the shampoo.  4. Use CHG as you would any other liquid soap. You can apply CHG directly to the skin and wash gently with a scrungie or a clean washcloth.   5. Apply the CHG Soap to your body ONLY FROM THE NECK DOWN.  Do not use on open wounds or open sores. Avoid contact with your eyes, ears, mouth and genitals (private parts). Wash genitals (private parts) with your normal soap.  6. Wash thoroughly, paying special attention to the area where your surgery will be performed.  7. Thoroughly rinse your body with warm water from the neck down.  8. DO NOT shower/wash with your normal soap after using  and rinsing off the CHG Soap.  9. Pat yourself dry with a CLEAN TOWEL.   10. Wear CLEAN PAJAMAS   11. Place CLEAN SHEETS on your bed the night of your first shower and DO NOT SLEEP WITH PETS.    Day of Surgery: Do not apply any deodorants/lotions. Please wear clean clothes to the hospital/surgery center.      Please read over the following fact sheets that you were given. Coughing and Deep Breathing, Total Joint Packet, MRSA Information and Surgical Site Infection Prevention

## 2016-12-02 NOTE — Progress Notes (Signed)
Anesthesia Chart Review:  Pt is a 71 year old male scheduled for R total hip arthroplasty anterior approach on 12/13/2016 with Rod Can, MD  - PCP is Vic Blackbird, MD who cleared pt for surgery at last office visit 11/01/16  PMH includes:  Hyperlipidemia, OSA, post-op N/V, GERD. Former smoker. BMI 28.5.  Medications include: Colestipol, Prilosec  Preoperative labs reviewed.  CT chest 12/26/15:  - Bilateral pulmonary nodules. No follow-up needed if patient is low-risk (and has no known or suspected primary neoplasm). Non-contrast chest CT can be considered in 12 months if patient is high-risk.  EKG 09/24/16: Sinus bradycardia (59 bpm)  Holter monitor 09/28/16: No bradycardia occasional PVCs  If no changes, I anticipate pt can proceed with surgery as scheduled.   Willeen Cass, FNP-BC [facility] Short Stay Surgical Center/Anesthesiology Phone: [phone] 12/02/2016 3:59 PM

## 2016-12-08 DIAGNOSIS — M19011 Primary osteoarthritis, right shoulder: Secondary | ICD-10-CM | POA: Diagnosis not present

## 2016-12-10 MED ORDER — SODIUM CHLORIDE 0.9 % IV SOLN
1000.0000 mg | INTRAVENOUS | Status: AC
  Administered 2016-12-13: 1000 mg via INTRAVENOUS
  Filled 2016-12-10: qty 1100

## 2016-12-10 MED ORDER — CEFAZOLIN SODIUM-DEXTROSE 2-4 GM/100ML-% IV SOLN
2.0000 g | INTRAVENOUS | Status: AC
  Administered 2016-12-13: 2 g via INTRAVENOUS
  Filled 2016-12-10: qty 100

## 2016-12-12 NOTE — Anesthesia Preprocedure Evaluation (Signed)
Anesthesia Evaluation  Patient identified by MRN, date of birth, ID band Patient awake    Reviewed: Allergy & Precautions, H&P , Patient's Chart, lab work & pertinent test results, reviewed documented beta blocker date and time   Airway Mallampati: II  TM Distance: >3 FB Neck ROM: full    Dental no notable dental hx.    Pulmonary sleep apnea , former smoker,    Pulmonary exam normal breath sounds clear to auscultation       Cardiovascular  Rhythm:regular Rate:Normal     Neuro/Psych    GI/Hepatic   Endo/Other    Renal/GU      Musculoskeletal   Abdominal   Peds  Hematology   Anesthesia Other Findings   Reproductive/Obstetrics                             Anesthesia Physical Anesthesia Plan  ASA: II  Anesthesia Plan: Spinal   Post-op Pain Management:    Induction:   PONV Risk Score and Plan:   Airway Management Planned:   Additional Equipment:   Intra-op Plan:   Post-operative Plan:   Informed Consent: I have reviewed the patients History and Physical, chart, labs and discussed the procedure including the risks, benefits and alternatives for the proposed anesthesia with the patient or authorized representative who has indicated his/her understanding and acceptance.   Dental Advisory Given  Plan Discussed with: CRNA and Surgeon  Anesthesia Plan Comments: (  )        Anesthesia Quick Evaluation

## 2016-12-13 ENCOUNTER — Inpatient Hospital Stay (HOSPITAL_COMMUNITY)
Admission: RE | Admit: 2016-12-13 | Discharge: 2016-12-14 | DRG: 470 | Disposition: A | Discharge status: Home / Self Care | Payer: Medicare HMO | Source: Ambulatory Visit | Attending: Orthopedic Surgery | Admitting: Orthopedic Surgery

## 2016-12-13 ENCOUNTER — Encounter (HOSPITAL_COMMUNITY): Payer: Self-pay | Admitting: *Deleted

## 2016-12-13 ENCOUNTER — Inpatient Hospital Stay (HOSPITAL_COMMUNITY): Payer: Medicare HMO

## 2016-12-13 ENCOUNTER — Inpatient Hospital Stay (HOSPITAL_COMMUNITY): Payer: Medicare HMO | Admitting: Anesthesiology

## 2016-12-13 ENCOUNTER — Inpatient Hospital Stay (HOSPITAL_COMMUNITY): Payer: Medicare HMO | Admitting: Emergency Medicine

## 2016-12-13 ENCOUNTER — Encounter (HOSPITAL_COMMUNITY)
Admission: RE | Disposition: A | Discharge status: Home / Self Care | Payer: Self-pay | Source: Ambulatory Visit | Attending: Orthopedic Surgery

## 2016-12-13 DIAGNOSIS — Z96641 Presence of right artificial hip joint: Secondary | ICD-10-CM | POA: Diagnosis not present

## 2016-12-13 DIAGNOSIS — Z9181 History of falling: Secondary | ICD-10-CM

## 2016-12-13 DIAGNOSIS — Z09 Encounter for follow-up examination after completed treatment for conditions other than malignant neoplasm: Secondary | ICD-10-CM

## 2016-12-13 DIAGNOSIS — Z809 Family history of malignant neoplasm, unspecified: Secondary | ICD-10-CM

## 2016-12-13 DIAGNOSIS — Z87891 Personal history of nicotine dependence: Secondary | ICD-10-CM

## 2016-12-13 DIAGNOSIS — Z9049 Acquired absence of other specified parts of digestive tract: Secondary | ICD-10-CM | POA: Diagnosis not present

## 2016-12-13 DIAGNOSIS — M25519 Pain in unspecified shoulder: Secondary | ICD-10-CM | POA: Diagnosis present

## 2016-12-13 DIAGNOSIS — G8929 Other chronic pain: Secondary | ICD-10-CM | POA: Diagnosis not present

## 2016-12-13 DIAGNOSIS — Z471 Aftercare following joint replacement surgery: Secondary | ICD-10-CM | POA: Diagnosis not present

## 2016-12-13 DIAGNOSIS — G4733 Obstructive sleep apnea (adult) (pediatric): Secondary | ICD-10-CM | POA: Diagnosis present

## 2016-12-13 DIAGNOSIS — K219 Gastro-esophageal reflux disease without esophagitis: Secondary | ICD-10-CM | POA: Diagnosis not present

## 2016-12-13 DIAGNOSIS — I10 Essential (primary) hypertension: Secondary | ICD-10-CM | POA: Diagnosis present

## 2016-12-13 DIAGNOSIS — M1611 Unilateral primary osteoarthritis, right hip: Secondary | ICD-10-CM | POA: Diagnosis not present

## 2016-12-13 DIAGNOSIS — Z8049 Family history of malignant neoplasm of other genital organs: Secondary | ICD-10-CM | POA: Diagnosis not present

## 2016-12-13 DIAGNOSIS — G4700 Insomnia, unspecified: Secondary | ICD-10-CM | POA: Diagnosis not present

## 2016-12-13 DIAGNOSIS — Z419 Encounter for procedure for purposes other than remedying health state, unspecified: Secondary | ICD-10-CM

## 2016-12-13 HISTORY — PX: TOTAL HIP ARTHROPLASTY: SHX124

## 2016-12-13 SURGERY — ARTHROPLASTY, HIP, TOTAL, ANTERIOR APPROACH
Anesthesia: Spinal | Site: Hip | Laterality: Right

## 2016-12-13 MED ORDER — MENTHOL 3 MG MT LOZG: 1.0000 | LOZENGE | OROMUCOSAL | Status: DC | PRN

## 2016-12-13 MED ORDER — DEXAMETHASONE SODIUM PHOSPHATE 10 MG/ML IJ SOLN
10.0000 mg | Freq: Once | INTRAMUSCULAR | Status: AC
  Administered 2016-12-14: 10 mg via INTRAVENOUS
  Filled 2016-12-13: qty 1

## 2016-12-13 MED ORDER — POLYETHYLENE GLYCOL 3350 17 G PO PACK: 17.0000 g | PACK | Freq: Every day | ORAL | Status: DC | PRN

## 2016-12-13 MED ORDER — SODIUM CHLORIDE 0.9 % IR SOLN
Status: DC | PRN
  Administered 2016-12-13: 1000 mL
  Administered 2016-12-13: 3000 mL

## 2016-12-13 MED ORDER — HYDROMORPHONE HCL 1 MG/ML IJ SOLN: 0.5000 mg | INTRAMUSCULAR | Status: DC | PRN

## 2016-12-13 MED ORDER — KETOROLAC TROMETHAMINE 30 MG/ML IJ SOLN
INTRAMUSCULAR | Status: AC
  Filled 2016-12-13: qty 1

## 2016-12-13 MED ORDER — DEXAMETHASONE SODIUM PHOSPHATE 10 MG/ML IJ SOLN
INTRAMUSCULAR | Status: AC
  Filled 2016-12-13: qty 1

## 2016-12-13 MED ORDER — FENTANYL CITRATE (PF) 100 MCG/2ML IJ SOLN: 25.0000 ug | INTRAMUSCULAR | Status: DC | PRN

## 2016-12-13 MED ORDER — BUPIVACAINE-EPINEPHRINE (PF) 0.5% -1:200000 IJ SOLN
INTRAMUSCULAR | Status: AC
  Filled 2016-12-13: qty 30

## 2016-12-13 MED ORDER — 0.9 % SODIUM CHLORIDE (POUR BTL) OPTIME
TOPICAL | Status: DC | PRN
  Administered 2016-12-13: 1000 mL

## 2016-12-13 MED ORDER — HYDROCODONE-ACETAMINOPHEN 5-325 MG PO TABS
1.0000 | ORAL_TABLET | ORAL | Status: DC | PRN
  Administered 2016-12-13: 1 via ORAL
  Administered 2016-12-13: 2 via ORAL
  Administered 2016-12-13: 1 via ORAL
  Administered 2016-12-14: 2 via ORAL
  Filled 2016-12-13 (×2): qty 2
  Filled 2016-12-13 (×2): qty 1

## 2016-12-13 MED ORDER — BUPIVACAINE-EPINEPHRINE 0.25% -1:200000 IJ SOLN
INTRAMUSCULAR | Status: AC
  Filled 2016-12-13: qty 1

## 2016-12-13 MED ORDER — SODIUM CHLORIDE 0.9 % IV SOLN: INTRAVENOUS | Status: DC

## 2016-12-13 MED ORDER — ACETAMINOPHEN 650 MG RE SUPP: 650.0000 mg | Freq: Four times a day (QID) | RECTAL | Status: DC | PRN

## 2016-12-13 MED ORDER — KETOROLAC TROMETHAMINE 30 MG/ML IJ SOLN
INTRAMUSCULAR | Status: DC | PRN
  Administered 2016-12-13: 30 mg via INTRAVENOUS

## 2016-12-13 MED ORDER — ASPIRIN 81 MG PO CHEW
81.0000 mg | CHEWABLE_TABLET | Freq: Two times a day (BID) | ORAL | Status: DC
  Administered 2016-12-14: 81 mg via ORAL
  Filled 2016-12-13 (×2): qty 1

## 2016-12-13 MED ORDER — MIDAZOLAM HCL 5 MG/5ML IJ SOLN
INTRAMUSCULAR | Status: DC | PRN
  Administered 2016-12-13: 2 mg via INTRAVENOUS

## 2016-12-13 MED ORDER — METOCLOPRAMIDE HCL 5 MG/ML IJ SOLN: 5.0000 mg | Freq: Three times a day (TID) | INTRAMUSCULAR | Status: DC | PRN

## 2016-12-13 MED ORDER — ONDANSETRON HCL 4 MG/2ML IJ SOLN
INTRAMUSCULAR | Status: DC | PRN
  Administered 2016-12-13: 4 mg via INTRAVENOUS

## 2016-12-13 MED ORDER — PANTOPRAZOLE SODIUM 40 MG PO TBEC
40.0000 mg | DELAYED_RELEASE_TABLET | Freq: Every day | ORAL | Status: DC
  Administered 2016-12-13 – 2016-12-14 (×2): 40 mg via ORAL
  Filled 2016-12-13 (×2): qty 1

## 2016-12-13 MED ORDER — PHENOL 1.4 % MT LIQD: 1.0000 | OROMUCOSAL | Status: DC | PRN

## 2016-12-13 MED ORDER — ONDANSETRON HCL 4 MG/2ML IJ SOLN: 4.0000 mg | Freq: Four times a day (QID) | INTRAMUSCULAR | Status: DC | PRN

## 2016-12-13 MED ORDER — DIPHENHYDRAMINE HCL 12.5 MG/5ML PO ELIX: 12.5000 mg | ORAL_SOLUTION | ORAL | Status: DC | PRN

## 2016-12-13 MED ORDER — PROPOFOL 10 MG/ML IV BOLUS
INTRAVENOUS | Status: DC | PRN
  Administered 2016-12-13: 10 mg via INTRAVENOUS
  Administered 2016-12-13: 15 mg via INTRAVENOUS
  Administered 2016-12-13: 20 mg via INTRAVENOUS

## 2016-12-13 MED ORDER — METHOCARBAMOL 500 MG PO TABS
500.0000 mg | ORAL_TABLET | Freq: Four times a day (QID) | ORAL | Status: DC | PRN
  Administered 2016-12-13 – 2016-12-14 (×2): 500 mg via ORAL
  Filled 2016-12-13 (×2): qty 1

## 2016-12-13 MED ORDER — SODIUM CHLORIDE 0.9 % IV SOLN
INTRAVENOUS | Status: DC
  Administered 2016-12-13 – 2016-12-14 (×2): via INTRAVENOUS

## 2016-12-13 MED ORDER — DOCUSATE SODIUM 100 MG PO CAPS
100.0000 mg | ORAL_CAPSULE | Freq: Two times a day (BID) | ORAL | Status: DC
  Administered 2016-12-14: 100 mg via ORAL
  Filled 2016-12-13 (×2): qty 1

## 2016-12-13 MED ORDER — PROPOFOL 500 MG/50ML IV EMUL
INTRAVENOUS | Status: DC | PRN
  Administered 2016-12-13: 100 ug/kg/min via INTRAVENOUS

## 2016-12-13 MED ORDER — CEFAZOLIN SODIUM-DEXTROSE 2-4 GM/100ML-% IV SOLN
2.0000 g | Freq: Four times a day (QID) | INTRAVENOUS | Status: AC
  Administered 2016-12-13 (×2): 2 g via INTRAVENOUS
  Filled 2016-12-13 (×2): qty 100

## 2016-12-13 MED ORDER — ACETAMINOPHEN 10 MG/ML IV SOLN
1000.0000 mg | INTRAVENOUS | Status: AC
  Administered 2016-12-13: 1000 mg via INTRAVENOUS
  Filled 2016-12-13: qty 100

## 2016-12-13 MED ORDER — CHLORHEXIDINE GLUCONATE 4 % EX LIQD: 60.0000 mL | Freq: Once | CUTANEOUS | Status: DC

## 2016-12-13 MED ORDER — BUPIVACAINE-EPINEPHRINE (PF) 0.5% -1:200000 IJ SOLN
INTRAMUSCULAR | Status: DC | PRN
  Administered 2016-12-13: 30 mL

## 2016-12-13 MED ORDER — FENTANYL CITRATE (PF) 250 MCG/5ML IJ SOLN
INTRAMUSCULAR | Status: AC
  Filled 2016-12-13: qty 5

## 2016-12-13 MED ORDER — MIDAZOLAM HCL 2 MG/2ML IJ SOLN
INTRAMUSCULAR | Status: AC
  Filled 2016-12-13: qty 2

## 2016-12-13 MED ORDER — DEXAMETHASONE SODIUM PHOSPHATE 10 MG/ML IJ SOLN
INTRAMUSCULAR | Status: DC | PRN
  Administered 2016-12-13: 10 mg via INTRAVENOUS

## 2016-12-13 MED ORDER — TRANEXAMIC ACID 1000 MG/10ML IV SOLN
1000.0000 mg | Freq: Once | INTRAVENOUS | Status: AC
  Administered 2016-12-13: 1000 mg via INTRAVENOUS
  Filled 2016-12-13: qty 10

## 2016-12-13 MED ORDER — SODIUM CHLORIDE 0.9 % IJ SOLN
INTRAMUSCULAR | Status: DC | PRN
Start: 2016-12-13 — End: 2016-12-13
  Administered 2016-12-13: 30 mL via INTRAVENOUS

## 2016-12-13 MED ORDER — FENTANYL CITRATE (PF) 100 MCG/2ML IJ SOLN
INTRAMUSCULAR | Status: DC | PRN
  Administered 2016-12-13: 25 ug via INTRAVENOUS

## 2016-12-13 MED ORDER — ACETAMINOPHEN 325 MG PO TABS: 650.0000 mg | ORAL_TABLET | Freq: Four times a day (QID) | ORAL | Status: DC | PRN

## 2016-12-13 MED ORDER — LACTATED RINGERS IV SOLN
INTRAVENOUS | Status: DC | PRN
  Administered 2016-12-13 (×2): via INTRAVENOUS

## 2016-12-13 MED ORDER — METHOCARBAMOL 1000 MG/10ML IJ SOLN
500.0000 mg | Freq: Four times a day (QID) | INTRAVENOUS | Status: DC | PRN
  Filled 2016-12-13: qty 5

## 2016-12-13 MED ORDER — TAMSULOSIN HCL 0.4 MG PO CAPS
0.4000 mg | ORAL_CAPSULE | Freq: Every day | ORAL | Status: DC
  Administered 2016-12-13 – 2016-12-14 (×2): 0.4 mg via ORAL
  Filled 2016-12-13 (×2): qty 1

## 2016-12-13 MED ORDER — ONDANSETRON HCL 4 MG PO TABS: 4.0000 mg | ORAL_TABLET | Freq: Four times a day (QID) | ORAL | Status: DC | PRN

## 2016-12-13 MED ORDER — KETOROLAC TROMETHAMINE 15 MG/ML IJ SOLN
7.5000 mg | Freq: Four times a day (QID) | INTRAMUSCULAR | Status: AC
  Administered 2016-12-13 – 2016-12-14 (×4): 7.5 mg via INTRAVENOUS
  Filled 2016-12-13 (×4): qty 1

## 2016-12-13 MED ORDER — SENNA 8.6 MG PO TABS
2.0000 | ORAL_TABLET | Freq: Every day | ORAL | Status: DC
  Filled 2016-12-13: qty 2

## 2016-12-13 MED ORDER — COLESTIPOL HCL 1 G PO TABS
2.0000 g | ORAL_TABLET | Freq: Two times a day (BID) | ORAL | Status: DC
  Administered 2016-12-13 – 2016-12-14 (×2): 2 g via ORAL
  Filled 2016-12-13 (×2): qty 2

## 2016-12-13 MED ORDER — METOCLOPRAMIDE HCL 5 MG PO TABS: 5.0000 mg | ORAL_TABLET | Freq: Three times a day (TID) | ORAL | Status: DC | PRN

## 2016-12-13 MED ORDER — DEXTROSE 5 % IV SOLN
INTRAVENOUS | Status: DC | PRN
  Administered 2016-12-13: 15 ug/min via INTRAVENOUS

## 2016-12-13 MED ORDER — ONDANSETRON HCL 4 MG/2ML IJ SOLN
INTRAMUSCULAR | Status: AC
Start: 2016-12-13 — End: 2016-12-13
  Filled 2016-12-13: qty 2

## 2016-12-13 SURGICAL SUPPLY — 53 items
ADH SKN CLS APL DERMABOND .7 (GAUZE/BANDAGES/DRESSINGS) ×2
ALCOHOL ISOPROPYL (RUBBING) (MISCELLANEOUS) ×2 IMPLANT
BLADE CLIPPER SURG (BLADE) IMPLANT
CAPT HIP TOTAL 2 ×1 IMPLANT
CHLORAPREP W/TINT 26ML (MISCELLANEOUS) ×2 IMPLANT
COVER SURGICAL LIGHT HANDLE (MISCELLANEOUS) ×2 IMPLANT
DERMABOND ADVANCED (GAUZE/BANDAGES/DRESSINGS) ×2
DERMABOND ADVANCED .7 DNX12 (GAUZE/BANDAGES/DRESSINGS) ×2 IMPLANT
DRAPE C-ARM 42X72 X-RAY (DRAPES) ×2 IMPLANT
DRAPE STERI IOBAN 125X83 (DRAPES) ×2 IMPLANT
DRAPE U-SHAPE 47X51 STRL (DRAPES) ×6 IMPLANT
DRSG AQUACEL AG ADV 3.5X10 (GAUZE/BANDAGES/DRESSINGS) ×2 IMPLANT
ELECT BLADE 4.0 EZ CLEAN MEGAD (MISCELLANEOUS) ×2
ELECT REM PT RETURN 9FT ADLT (ELECTROSURGICAL) ×2
ELECTRODE BLDE 4.0 EZ CLN MEGD (MISCELLANEOUS) ×1 IMPLANT
ELECTRODE REM PT RTRN 9FT ADLT (ELECTROSURGICAL) ×1 IMPLANT
EVACUATOR 1/8 PVC DRAIN (DRAIN) IMPLANT
GLOVE BIO SURGEON STRL SZ8.5 (GLOVE) ×4 IMPLANT
GLOVE BIOGEL PI IND STRL 8.5 (GLOVE) ×1 IMPLANT
GLOVE BIOGEL PI INDICATOR 8.5 (GLOVE) ×1
GOWN STRL REUS W/ TWL LRG LVL3 (GOWN DISPOSABLE) ×2 IMPLANT
GOWN STRL REUS W/TWL 2XL LVL3 (GOWN DISPOSABLE) ×2 IMPLANT
GOWN STRL REUS W/TWL LRG LVL3 (GOWN DISPOSABLE) ×4
HANDPIECE INTERPULSE COAX TIP (DISPOSABLE) ×2
HOOD PEEL AWAY FACE SHEILD DIS (HOOD) ×4 IMPLANT
KIT BASIN OR (CUSTOM PROCEDURE TRAY) ×2 IMPLANT
KIT ROOM TURNOVER OR (KITS) ×2 IMPLANT
MANIFOLD NEPTUNE II (INSTRUMENTS) ×2 IMPLANT
MARKER SKIN DUAL TIP RULER LAB (MISCELLANEOUS) ×4 IMPLANT
NDL SPNL 18GX3.5 QUINCKE PK (NEEDLE) ×1 IMPLANT
NEEDLE SPNL 18GX3.5 QUINCKE PK (NEEDLE) ×2 IMPLANT
NS IRRIG 1000ML POUR BTL (IV SOLUTION) ×2 IMPLANT
PACK TOTAL JOINT (CUSTOM PROCEDURE TRAY) ×2 IMPLANT
PACK UNIVERSAL I (CUSTOM PROCEDURE TRAY) ×2 IMPLANT
PAD ARMBOARD 7.5X6 YLW CONV (MISCELLANEOUS) ×4 IMPLANT
SAW OSC TIP CART 19.5X105X1.3 (SAW) ×2 IMPLANT
SEALER BIPOLAR AQUA 6.0 (INSTRUMENTS) IMPLANT
SET HNDPC FAN SPRY TIP SCT (DISPOSABLE) ×1 IMPLANT
SOL PREP POV-IOD 4OZ 10% (MISCELLANEOUS) ×2 IMPLANT
SUT ETHIBOND NAB CT1 #1 30IN (SUTURE) ×4 IMPLANT
SUT MNCRL AB 3-0 PS2 18 (SUTURE) ×2 IMPLANT
SUT MON AB 2-0 CT1 36 (SUTURE) ×2 IMPLANT
SUT VIC AB 1 CT1 27 (SUTURE) ×2
SUT VIC AB 1 CT1 27XBRD ANBCTR (SUTURE) ×1 IMPLANT
SUT VIC AB 2-0 CT1 27 (SUTURE) ×2
SUT VIC AB 2-0 CT1 TAPERPNT 27 (SUTURE) ×1 IMPLANT
SUT VLOC 180 0 24IN GS25 (SUTURE) ×2 IMPLANT
SYR 50ML LL SCALE MARK (SYRINGE) ×2 IMPLANT
TOWEL OR 17X24 6PK STRL BLUE (TOWEL DISPOSABLE) ×2 IMPLANT
TOWEL OR 17X26 10 PK STRL BLUE (TOWEL DISPOSABLE) ×2 IMPLANT
TRAY CATH 16FR W/PLASTIC CATH (SET/KITS/TRAYS/PACK) IMPLANT
TRAY FOLEY CATH SILVER 16FR (SET/KITS/TRAYS/PACK) IMPLANT
WATER STERILE IRR 1000ML POUR (IV SOLUTION) ×6 IMPLANT

## 2016-12-13 NOTE — Interval H&P Note (Signed)
History and Physical Interval Note:  12/13/2016 7:30 AM  Brett [name]  has presented today for surgery, with the diagnosis of DJD right hip  The various methods of treatment have been discussed with the patient and family. After consideration of risks, benefits and other options for treatment, the patient has consented to  Procedure(s): TOTAL HIP ARTHROPLASTY ANTERIOR APPROACH (Right) as a surgical intervention .  The patient's history has been reviewed, patient examined, no change in status, stable for surgery.  I have reviewed the patient's chart and labs.  Questions were answered to the patient's satisfaction.     Chonte Ricke, Horald Pollen

## 2016-12-13 NOTE — Discharge Instructions (Signed)
°Dr. Ulisses Vondrak °Joint Replacement Specialist °[facility] °[address] °[place], [place] [address] °[phone] ° ° °TOTAL HIP REPLACEMENT POSTOPERATIVE DIRECTIONS ° ° ° °Hip Rehabilitation, Guidelines Following Surgery  ° °WEIGHT BEARING °Weight bearing as tolerated with assist device (walker, cane, etc) as directed, use it as long as suggested by your surgeon or therapist, typically at least 4-6 weeks. ° °The results of a hip operation are greatly improved after range of motion and muscle strengthening exercises. Follow all safety measures which are given to protect your hip. If any of these exercises cause increased pain or swelling in your joint, decrease the amount until you are comfortable again. Then slowly increase the exercises. Call your caregiver if you have problems or questions.  ° °HOME CARE INSTRUCTIONS  °Most of the following instructions are designed to prevent the dislocation of your new hip.  °Remove items at home which could result in a fall. This includes throw rugs or furniture in walking pathways.  °Continue medications as instructed at time of discharge. °· You may have some home medications which will be placed on hold until you complete the course of blood thinner medication. °· You may start showering once you are discharged home. Do not remove your dressing. °Do not put on socks or shoes without following the instructions of your caregivers.   °Sit on chairs with arms. Use the chair arms to help push yourself up when arising.  °Arrange for the use of a toilet seat elevator so you are not sitting low.  °· Walk with walker as instructed.  °You may resume a sexual relationship in one month or when given the OK by your caregiver.  °Use walker as long as suggested by your caregivers.  °You may put full weight on your legs and walk as much as is comfortable. °Avoid periods of inactivity such as sitting longer than an hour when not asleep. This helps prevent  blood clots.  °You may return to work once you are cleared by your surgeon.  °Do not drive a car for 6 weeks or until released by your surgeon.  °Do not drive while taking narcotics.  °Wear elastic stockings for two weeks following surgery during the day but you may remove then at night.  °Make sure you keep all of your appointments after your operation with all of your doctors and caregivers. You should call the office at the above phone number and make an appointment for approximately two weeks after the date of your surgery. °Please pick up a stool softener and laxative for home use as long as you are requiring pain medications. °· ICE to the affected hip every three hours for 30 minutes at a time and then as needed for pain and swelling. Continue to use ice on the hip for pain and swelling from surgery. You may notice swelling that will progress down to the foot and ankle.  This is normal after surgery.  Elevate the leg when you are not up walking on it.   °It is important for you to complete the blood thinner medication as prescribed by your doctor. °· Continue to use the breathing machine which will help keep your temperature down.  It is common for your temperature to cycle up and down following surgery, especially at night when you are not up moving around and exerting yourself.  The breathing machine keeps your lungs expanded and your temperature down. ° °RANGE OF MOTION AND STRENGTHENING EXERCISES  °These exercises are   designed to help you keep full movement of your hip joint. Follow your caregiver's or physical therapist's instructions. Perform all exercises about fifteen times, three times per day or as directed. Exercise both hips, even if you have had only one joint replacement. These exercises can be done on a training (exercise) mat, on the floor, on a table or on a bed. Use whatever works the best and is most comfortable for you. Use music or television while you are exercising so that the exercises  are a pleasant break in your day. This will make your life better with the exercises acting as a break in routine you can look forward to.  °Lying on your back, slowly slide your foot toward your buttocks, raising your knee up off the floor. Then slowly slide your foot back down until your leg is straight again.  °Lying on your back spread your legs as far apart as you can without causing discomfort.  °Lying on your side, raise your upper leg and foot straight up from the floor as far as is comfortable. Slowly lower the leg and repeat.  °Lying on your back, tighten up the muscle in the front of your thigh (quadriceps muscles). You can do this by keeping your leg straight and trying to raise your heel off the floor. This helps strengthen the largest muscle supporting your knee.  °Lying on your back, tighten up the muscles of your buttocks both with the legs straight and with the knee bent at a comfortable angle while keeping your heel on the floor.  ° °SKILLED REHAB INSTRUCTIONS: °If the patient is transferred to a skilled rehab facility following release from the hospital, a list of the current medications will be sent to the facility for the patient to continue.  When discharged from the skilled rehab facility, please have the facility set up the patient's Home Health Physical Therapy prior to being released. Also, the skilled facility will be responsible for providing the patient with their medications at time of release from the facility to include their pain medication and their blood thinner medication. If the patient is still at the rehab facility at time of the two week follow up appointment, the skilled rehab facility will also need to assist the patient in arranging follow up appointment in our office and any transportation needs. ° °MAKE SURE YOU:  °Understand these instructions.  °Will watch your condition.  °Will get help right away if you are not doing well or get worse. ° °Pick up stool softner and  laxative for home use following surgery while on pain medications. °Do not remove your dressing. °The dressing is waterproof--it is OK to take showers. °Continue to use ice for pain and swelling after surgery. °Do not use any lotions or creams on the incision until instructed by your surgeon. °Total Hip Protocol. ° ° °

## 2016-12-13 NOTE — Evaluation (Signed)
Physical Therapy Evaluation Patient Details Name: Brett [name] MRN: [id number] DOB: 05-Dec-1945 Today's Date: 12/13/2016   History of Present Illness  Pt is a 71 y/o male s/p elective R THA, direct anterior approach. PMH includes HTN, DDD, OSA on CPAP, and rotator cuff surgery.   Clinical Impression  Pt is s/p surgery above with deficits below. PTA, pt was independent with functional mobility. Upon eval, pt limited by post op pain and weakness. Pt tolerated gait training well, and required min guard throughout. Pt reports he will have necessary assist from wife and will need equipment below. Follow up recommendations per MD arrangements. Will continue to follow acutely to maximize functional mobility independence and safety.     Follow Up Recommendations DC plan and follow up therapy as arranged by surgeon;Supervision/Assistance - 24 hour    Equipment Recommendations  Rolling walker with 5" wheels;3in1 (PT)    Recommendations for Other Services       Precautions / Restrictions Precautions Precautions: None Precaution Comments: Reviewed THA exercise handout with pt  Restrictions Weight Bearing Restrictions: Yes RLE Weight Bearing: Weight bearing as tolerated      Mobility  Bed Mobility Overal bed mobility: Needs Assistance Bed Mobility: Supine to Sit     Supine to sit: Supervision     General bed mobility comments: Supervision for safety. Use of bed rails and elevated HOB.   Transfers Overall transfer level: Needs assistance Equipment used: Rolling walker (2 wheeled) Transfers: Sit to/from Stand Sit to Stand: Min guard         General transfer comment: Min guard for safety. Verbal cues for safe hand placement.   Ambulation/Gait Ambulation/Gait assistance: Min guard Ambulation Distance (Feet): 75 Feet Assistive device: Rolling walker (2 wheeled) Gait Pattern/deviations: Step-through pattern;Decreased stride length;Decreased weight shift to right;Antalgic Gait  velocity: Decreased Gait velocity interpretation: Below normal speed for age/gender General Gait Details: Antalgic gait secondary to post op pain and weakness. Verbal cues for sequencing with RW. Verbal cues to increase weight shift to the right and increase step length.   Stairs            Wheelchair Mobility    Modified Rankin (Stroke Patients Only)       Balance Overall balance assessment: Needs assistance Sitting-balance support: No upper extremity supported;Feet supported Sitting balance-Leahy Scale: Good     Standing balance support: Bilateral upper extremity supported;During functional activity Standing balance-Leahy Scale: Poor Standing balance comment: Reliant on RW for stability.                              Pertinent Vitals/Pain Pain Assessment: 0-10 Pain Score: 4  Pain Location: R hip  Pain Descriptors / Indicators: Aching;Sore;Operative site guarding Pain Intervention(s): Limited activity within patient's tolerance;Monitored during session;Repositioned    Home Living Family/patient expects to be discharged to:: Private residence Living Arrangements: Spouse/significant other Available Help at Discharge: Family;Available 24 hours/day Type of Home: House Home Access: Stairs to enter Entrance Stairs-Rails: Left Entrance Stairs-Number of Steps: 3 Home Layout: One level Home Equipment: None      Prior Function Level of Independence: Independent               Hand Dominance   Dominant Hand: Right    Extremity/Trunk Assessment   Upper Extremity Assessment Upper Extremity Assessment: Overall WFL for tasks assessed    Lower Extremity Assessment Lower Extremity Assessment: RLE deficits/detail RLE Deficits / Details: Sensory in tact. Able to perform  exercise below. Deficits consistent with post op pain and weakness.     Cervical / Trunk Assessment Cervical / Trunk Assessment: Normal  Communication   Communication: No difficulties   Cognition Arousal/Alertness: Awake/alert Behavior During Therapy: WFL for tasks assessed/performed Overall Cognitive Status: Within Functional Limits for tasks assessed                                        General Comments General comments (skin integrity, edema, etc.): Pt's wife present throughout session.     Exercises Total Joint Exercises Ankle Circles/Pumps: AROM;Both;10 reps;Supine Quad Sets: AROM;Right;10 reps;Supine Short Arc Quad: AROM;Right;10 reps;Supine Heel Slides: AROM;Right;10 reps;Supine Hip ABduction/ADduction: AROM;Right;10 reps;Supine   Assessment/Plan    PT Assessment Patient needs continued PT services  PT Problem List Decreased strength;Decreased range of motion;Decreased balance;Decreased mobility;Decreased knowledge of use of DME;Decreased knowledge of precautions;Pain       PT Treatment Interventions DME instruction;Gait training;Stair training;Functional mobility training;Therapeutic activities;Therapeutic exercise;Balance training;Neuromuscular re-education;Patient/family education    PT Goals (Current goals can be found in the Care Plan section)  Acute Rehab PT Goals Patient Stated Goal: to go home  PT Goal Formulation: With patient Time For Goal Achievement: 12/20/16 Potential to Achieve Goals: Good    Frequency 7X/week   Barriers to discharge        Co-evaluation               AM-PAC PT "6 Clicks" Daily Activity  Outcome Measure Difficulty turning over in bed (including adjusting bedclothes, sheets and blankets)?: A Little Difficulty moving from lying on back to sitting on the side of the bed? : A Little Difficulty sitting down on and standing up from a chair with arms (e.g., wheelchair, bedside commode, etc,.)?: Total Help needed moving to and from a bed to chair (including a wheelchair)?: A Little Help needed walking in hospital room?: A Little Help needed climbing 3-5 steps with a railing? : A Little 6 Click  Score: 16    End of Session Equipment Utilized During Treatment: Gait belt Activity Tolerance: Patient tolerated treatment well Patient left: in chair;with call bell/phone within reach;with family/visitor present Nurse Communication: Mobility status PT Visit Diagnosis: Other abnormalities of gait and mobility (R26.89);Pain Pain - Right/Left: Right Pain - part of body: Hip    Time: 6295-2841 PT Time Calculation (min) (ACUTE ONLY): 20 min   Charges:   PT Evaluation $PT Eval Low Complexity: 1 Low PT Treatments $Gait Training: 8-22 mins   PT G Codes:        Gladys Damme, PT, DPT  Acute Rehabilitation Services  Pager: [phone]   Lehman Prom 12/13/2016, 3:20 PM

## 2016-12-13 NOTE — Care Management Note (Signed)
Case Management Note  Patient Details  Name: Brett [name] MRN: [id number] Date of Birth: [birth year]  Subjective/Objective:    S/p elective R THA                Action/Plan: Discharge Planning: NCM spoke to pt and wife at bedside. Offered choice for HH/list provided. Pt requesting RW for home. Will order RW on 12/14/2016 for [facility]. Pt agreeable to [facility] for HHPT. Will need PT orders with [facility].   PCP Vic Blackbird F MD  Expected Discharge Date:                  Expected Discharge Plan:  [place]  In-House Referral:  NA  Discharge planning Services  CM Consult  Post Acute Care Choice:  Home Health Choice offered to:  Patient, Spouse  DME Arranged:  Walker rolling DME Agency:  [place]:  PT [place] Agency:  [facility] ([facility])  Status of Service:  In process, will continue to follow  If discussed at Long Length of Stay Meetings, dates discussed:    Additional Comments:  Erenest Rasher, RN 12/13/2016, 5:58 PM

## 2016-12-13 NOTE — Op Note (Signed)
OPERATIVE REPORT  SURGEON: Rod Can, MD   ASSISTANT: April Green, RNFA.  PREOPERATIVE DIAGNOSIS: Right hip arthritis.   POSTOPERATIVE DIAGNOSIS: Right hip arthritis.   PROCEDURE: Right total hip arthroplasty, anterior approach.   IMPLANTS: DePuy Tri Lock stem, size 6, std offset. DePuy Pinnacle Cup, size 54 mm. DePuy Altrx liner, size 36 by 54 mm, +4 neutral. DePuy Biolox ceramic head ball, size 36 + 1.5 mm.  ANESTHESIA:  Spinal  ESTIMATED BLOOD LOSS: 250 mL.   ANTIBIOTICS: 2 g Ancef.  DRAINS: None.  COMPLICATIONS: None.   CONDITION: PACU - hemodynamically stable.   BRIEF CLINICAL NOTE: Brett [name] is a 71 y.o. male with a long-standing history of Right hip arthritis. After failing conservative management, the patient was indicated for total hip arthroplasty. The risks, benefits, and alternatives to the procedure were explained, and the patient elected to proceed.  PROCEDURE IN DETAIL: Surgical site was marked by myself in the pre-op holding area. Once inside the operating room, spinal anesthesia was obtained, and a foley catheter was inserted. The patient was then positioned on the Hana table. All bony prominences were well padded. The hip was prepped and draped in the normal sterile surgical fashion. A time-out was called verifying side and site of surgery. The patient received IV antibiotics within 60 minutes of beginning the procedure.  The direct anterior approach to the hip was performed through the Hueter interval. Lateral femoral circumflex vessels were treated with the Auqumantys. The anterior capsule was exposed and an inverted T capsulotomy was made.The femoral neck cut was made to the level of the templated cut. A corkscrew was placed into the head and the head was removed. The femoral head was found to have eburnated bone. The head was passed to the back table and was measured.  Acetabular exposure was achieved, and the pulvinar and labrum were  excised. Sequential reaming of the acetabulum was then performed up to a size 53 mm reamer. A 54 mm cup was then opened and impacted into place at approximately 40 degrees of abduction and 20 degrees of anteversion. The final polyethylene liner was impacted into place and acetabular osteophytes were removed.   I then gained femoral exposure taking care to protect the abductors and greater trochanter. This was performed using standard external rotation, extension, and adduction. The capsule was peeled off the inner aspect of the greater trochanter, taking care to preserve the short external rotators. A cookie cutter was used to enter the femoral canal, and then the femoral canal finder was placed. Sequential broaching was performed up to a size 6. Calcar planer was used on the femoral neck remnant. I placed a std offset neck and a trial head ball. The hip was reduced. Leg lengths and offset were checked fluoroscopically. The hip was dislocated and trial components were removed. The final implants were placed, and the hip was reduced.  Fluoroscopy was used to confirm component position and leg lengths. At 90 degrees of external rotation and full extension, the hip was stable to an anterior directed force.  The wound was copiously irrigated with normal saline using pulse lavage. Marcaine solution was injected into the periarticular soft tissue. The wound was closed in layers using #1 Vicryl and V-Loc for the fascia, 2-0 Vicryl for the subcutaneous fat, 2-0 Monocryl for the deep dermal layer, 3-0 running Monocryl subcuticular stitch, and Dermabond for the skin. Once the glue was fully dried, an Aquacell Ag dressing was applied. The patient was transported to the recovery room  in stable condition. Sponge, needle, and instrument counts were correct at the end of the case x2. The patient tolerated the procedure well and there were no known complications.

## 2016-12-13 NOTE — Anesthesia Procedure Notes (Signed)
Spinal  Patient location during procedure: OR Staffing Anesthesiologist: Lyndle Herrlich Preanesthetic Checklist Completed: patient identified, site marked, surgical consent, pre-op evaluation, timeout performed, IV checked, risks and benefits discussed and monitors and equipment checked Spinal Block Patient position: sitting Prep: DuraPrep Patient monitoring: heart rate, cardiac monitor, continuous pulse ox and blood pressure Approach: midline Location: L3-4 Injection technique: single-shot Needle Needle type: Sprotte  Needle gauge: 24 G Needle length: 9 cm Assessment Sensory level: T4 Additional Notes Spinal Dosage in OR  .75% Bupivicaine ml       1.9

## 2016-12-13 NOTE — Transfer of Care (Cosign Needed)
Immediate Anesthesia Transfer of Care Note  [name]  Procedure(s) Performed: Procedure(s): RIGHT TOTAL HIP ARTHROPLASTY ANTERIOR APPROACH (Right)  Patient Location: PACU  Anesthesia Type:General  Level of Consciousness: awake, alert , oriented, patient cooperative and responds to stimulation  Airway & Oxygen Therapy: Patient Spontanous Breathing and Patient connected to face mask oxygen  Post-op Assessment: Report given to RN, Post -op Vital signs reviewed and stable and Patient moving all extremities X 4  Post vital signs: Reviewed and stable  Last Vitals:  Vitals:   12/13/16 0617 12/13/16 0949  BP: (!) 177/72   Pulse: (!) 56   Temp: 36.6 C (!) (P) 36 C    Last Pain:  Vitals:   12/13/16 0617  TempSrc: Oral  PainSc:       Patients Stated Pain Goal: 6 (03/52/48 1859)  Complications: No apparent anesthesia complications

## 2016-12-13 NOTE — H&P (View-Only) (Signed)
TOTAL HIP ADMISSION H&P  Patient is admitted for right total hip arthroplasty.  Subjective:  Chief Complaint: right hip pain  HPI: Brett [name], 71 y.o. male, has a history of pain and functional disability in the right hip(s) due to arthritis and patient has failed non-surgical conservative treatments for greater than 12 weeks to include NSAID's and/or analgesics, corticosteriod injections, flexibility and strengthening excercises, use of assistive devices, weight reduction as appropriate and activity modification.  Onset of symptoms was gradual starting 2 years ago with gradually worsening course since that time.The patient noted no past surgery on the right hip(s).  Patient currently rates pain in the right hip at 10 out of 10 with activity. Patient has night pain, worsening of pain with activity and weight bearing, trendelenberg gait, pain that interfers with activities of daily living, pain with passive range of motion and crepitus. Patient has evidence of subchondral cysts, subchondral sclerosis, periarticular osteophytes and joint space narrowing by imaging studies. This condition presents safety issues increasing the risk of falls.  There is no current active infection.  Patient Active Problem List   Diagnosis Date Noted  . Essential hypertension 04/10/2014  . Insomnia due to stress 04/10/2014  . DDD (degenerative disc disease), cervical 04/10/2014  . Glucose intolerance (impaired glucose tolerance) 10/09/2013  . Hypertriglyceridemia 04/13/2013  . GERD (gastroesophageal reflux disease) 04/13/2013  . OSA on CPAP 04/13/2013  . Chronic shoulder pain 04/13/2013   Past Medical History:  Diagnosis Date  . Allergy   . Cataract   . Hyperlipidemia   . Sleep apnea     Past Surgical History:  Procedure Laterality Date  . CHOLECYSTECTOMY  2004  . FRACTURE SURGERY  2006  . rhino plasty     deviated septum  . SHOULDER SURGERY  2011   rotator cuff  . VASECTOMY  1980     (Not in a  hospital admission) No Known Allergies  Social History  Substance Use Topics  . Smoking status: Former Smoker    Quit date: 05/23/2010  . Smokeless tobacco: Never Used  . Alcohol use No    Family History  Problem Relation Age of Onset  . Cancer Sister        uterine   . Cancer Maternal Grandmother      Review of Systems  Constitutional: Negative.   HENT: Negative.   Eyes: Negative.   Respiratory: Negative.   Cardiovascular: Negative.   Gastrointestinal: Negative.   Genitourinary: Negative.   Musculoskeletal: Positive for joint pain.  Skin: Positive for rash.  Neurological: Negative.   Endo/Heme/Allergies: Positive for environmental allergies.  Psychiatric/Behavioral: Negative.     Objective:  Physical Exam  Vitals reviewed. Constitutional: He is oriented to person, place, and time. He appears well-developed and well-nourished.  HENT:  Head: Normocephalic and atraumatic.  Eyes: Pupils are equal, round, and reactive to light. Conjunctivae and EOM are normal.  Neck: Normal range of motion. Neck supple.  Cardiovascular: Normal rate, regular rhythm and intact distal pulses.   Respiratory: Effort normal. No respiratory distress.  GI: Soft. He exhibits no distension.  Genitourinary:  Genitourinary Comments: deferred  Musculoskeletal:       Right hip: He exhibits decreased range of motion, bony tenderness and crepitus.  Neurological: He is alert and oriented to person, place, and time. He has normal reflexes.  Skin: Skin is warm and dry.  Psychiatric: He has a normal mood and affect. His behavior is normal. Judgment and thought content normal.    Vital signs in  last 24 hours: @VSRANGES @  Labs:   Estimated body mass index is 28.59 kg/m as calculated from the following:   Height as of 11/01/16: 5\' 8"  (1.727 m).   Weight as of 11/01/16: 85.3 kg (188 lb).   Imaging Review Plain radiographs demonstrate severe degenerative joint disease of the right hip(s). The bone  quality appears to be adequate for age and reported activity level.  Assessment/Plan:  End stage arthritis, right hip(s)  The patient history, physical examination, clinical judgement of the provider and imaging studies are consistent with end stage degenerative joint disease of the right hip(s) and total hip arthroplasty is deemed medically necessary. The treatment options including medical management, injection therapy, arthroscopy and arthroplasty were discussed at length. The risks and benefits of total hip arthroplasty were presented and reviewed. The risks due to aseptic loosening, infection, stiffness, dislocation/subluxation,  thromboembolic complications and other imponderables were discussed.  The patient acknowledged the explanation, agreed to proceed with the plan and consent was signed. Patient is being admitted for inpatient treatment for surgery, pain control, PT, OT, prophylactic antibiotics, VTE prophylaxis, progressive ambulation and ADL's and discharge planning.The patient is planning to be discharged home with HEP

## 2016-12-14 ENCOUNTER — Encounter (HOSPITAL_COMMUNITY): Payer: Self-pay | Admitting: Orthopedic Surgery

## 2016-12-14 LAB — BASIC METABOLIC PANEL
Anion gap: 6 (ref 5–15)
BUN: 12 mg/dL (ref 6–20)
CO2: 27 mmol/L (ref 22–32)
Calcium: 8.3 mg/dL — ABNORMAL LOW (ref 8.9–10.3)
Chloride: 106 mmol/L (ref 101–111)
Creatinine, Ser: 0.93 mg/dL (ref 0.61–1.24)
GFR calc Af Amer: >60 mL/min (ref >60)
GFR calc non Af Amer: >60 mL/min (ref >60)
Glucose, Bld: 160 mg/dL — ABNORMAL HIGH (ref 65–99)
Potassium: 3.7 mmol/L (ref 3.5–5.1)
Sodium: 139 mmol/L (ref 135–145)

## 2016-12-14 LAB — CBC
HCT: 35.1 % — ABNORMAL LOW (ref 39.0–52.0)
Hemoglobin: 11.3 g/dL — ABNORMAL LOW (ref 13.0–17.0)
MCH: 26.9 pg (ref 26.0–34.0)
MCHC: 32.2 g/dL (ref 30.0–36.0)
MCV: 83.6 fL (ref 78.0–100.0)
Platelets: 307 10*3/uL (ref 150–400)
RBC: 4.2 MIL/uL — ABNORMAL LOW (ref 4.22–5.81)
RDW: 13.1 % (ref 11.5–15.5)
WBC: 16.4 10*3/uL — ABNORMAL HIGH (ref 4.0–10.5)

## 2016-12-14 MED ORDER — METHOCARBAMOL 500 MG PO TABS: 500.0000 mg | ORAL_TABLET | Freq: Four times a day (QID) | ORAL | 1 refills | Status: DC | PRN

## 2016-12-14 MED ORDER — DOCUSATE SODIUM 100 MG PO CAPS: 100.0000 mg | ORAL_CAPSULE | Freq: Two times a day (BID) | ORAL | 1 refills | Status: DC

## 2016-12-14 MED ORDER — SENNA 8.6 MG PO TABS: 2.0000 | ORAL_TABLET | Freq: Every day | ORAL | 0 refills | Status: DC

## 2016-12-14 MED ORDER — HYDROCODONE-ACETAMINOPHEN 5-325 MG PO TABS: 1.0000 | ORAL_TABLET | ORAL | 0 refills | Status: DC | PRN

## 2016-12-14 MED ORDER — ASPIRIN 81 MG PO CHEW: 81.0000 mg | CHEWABLE_TABLET | Freq: Two times a day (BID) | ORAL | 1 refills | Status: DC

## 2016-12-14 MED ORDER — ONDANSETRON HCL 4 MG PO TABS: 4.0000 mg | ORAL_TABLET | Freq: Four times a day (QID) | ORAL | 0 refills | Status: DC | PRN

## 2016-12-14 NOTE — Anesthesia Postprocedure Evaluation (Signed)
Anesthesia Post Note  [name]  Procedure(s) Performed: Procedure(s) (LRB): RIGHT TOTAL HIP ARTHROPLASTY ANTERIOR APPROACH (Right)     Patient location during evaluation: PACU Anesthesia Type: Spinal Level of consciousness: awake Pain management: satisfactory to patient Vital Signs Assessment: post-procedure vital signs reviewed and stable Respiratory status: spontaneous breathing Cardiovascular status: blood pressure returned to baseline Postop Assessment: no headache and spinal receding Anesthetic complications: no    Last Vitals:  Vitals:   12/14/16 0005 12/14/16 0414  BP: 127/65 (!) 144/70  Pulse: (!) 55 60  Resp: 20 20  Temp:  36.7 C    Last Pain:  Vitals:   12/14/16 0904  TempSrc:   PainSc: 1    Pain Goal: Patients Stated Pain Goal: 6 (12/13/16 0600)               Lyndle Herrlich EDWARD

## 2016-12-14 NOTE — Discharge Summary (Signed)
Physician Discharge Summary  Patient ID: Brett [name] MRN: [id number] DOB/AGE: [birth year] 70 y.o.  Admit date: 12/13/2016 Discharge date: 12/14/2016  Admission Diagnoses:  Osteoarthritis of right hip  Discharge Diagnoses:  Principal Problem:   Osteoarthritis of right hip   Past Medical History:  Diagnosis Date  . Allergy   . Arthritis   . Cataract   . GERD (gastroesophageal reflux disease)   . History of hiatal hernia   . Hyperlipidemia   . Pneumonia    hx. of  . PONV (postoperative nausea and vomiting)    with gallbladder surgery  . Sleep apnea     Surgeries: Procedure(s): RIGHT TOTAL HIP ARTHROPLASTY ANTERIOR APPROACH on 12/13/2016   Consultants (if any):   Discharged Condition: Improved  Hospital Course: Brett [name] is an 71 y.o. male who was admitted 12/13/2016 with a diagnosis of Osteoarthritis of right hip and went to the operating room on 12/13/2016 and underwent the above named procedures.    He was given perioperative antibiotics:  Anti-infectives    Start     Dose/Rate Route Frequency Ordered Stop   12/13/16 1345  ceFAZolin (ANCEF) IVPB 2g/100 mL premix     2 g 200 mL/hr over 30 Minutes Intravenous Every 6 hours 12/13/16 1215 12/13/16 2051   12/13/16 0600  ceFAZolin (ANCEF) IVPB 2g/100 mL premix     2 g 200 mL/hr over 30 Minutes Intravenous To [facility]Stay [facility] 12/10/16 1047 12/13/16 0749    .  He was given sequential compression devices, early ambulation, and ASA for DVT prophylaxis.  He benefited maximally from the hospital stay and there were no complications.    Recent vital signs:  Vitals:   12/14/16 0005 12/14/16 0414  BP: 127/65 (!) 144/70  Pulse: (!) 55 60  Resp: 20 20  Temp:  98.1 F (36.7 C)    Recent laboratory studies:  Lab Results  Component Value Date   HGB 11.3 (L) 12/14/2016   HGB 15.0 12/02/2016   HGB 14.3 09/22/2016   Lab Results  Component Value Date   WBC 16.4 (H) 12/14/2016   PLT 307 12/14/2016   No results found  for: INR Lab Results  Component Value Date   NA 139 12/14/2016   K 3.7 12/14/2016   CL 106 12/14/2016   CO2 27 12/14/2016   BUN 12 12/14/2016   CREATININE 0.93 12/14/2016   GLUCOSE 160 (H) 12/14/2016    Discharge Medications:   Allergies as of 12/14/2016      Reactions   Tramadol Nausea Only      Medication List    STOP taking these medications   ibuprofen 200 MG tablet Commonly known as:  ADVIL,MOTRIN     TAKE these medications   aspirin 81 MG chewable tablet Chew 1 tablet (81 mg total) by mouth 2 (two) times daily.   colestipol 1 g tablet Commonly known as:  COLESTID Take 2 tablets (2 g total) by mouth 2 (two) times daily.   diclofenac 75 MG EC tablet Commonly known as:  VOLTAREN TAKE 1 TABLET TWICE A DAY   docusate sodium 100 MG capsule Commonly known as:  COLACE Take 1 capsule (100 mg total) by mouth 2 (two) times daily.   HYDROcodone-acetaminophen 5-325 MG tablet Commonly known as:  NORCO/VICODIN Take 1-2 tablets by mouth every 4 (four) hours as needed (breakthrough pain).   meclizine 25 MG tablet Commonly known as:  ANTIVERT Take 1 tablet (25 mg total) by mouth 2 (two) times daily as needed.  methocarbamol 500 MG tablet Commonly known as:  ROBAXIN Take 1 tablet (500 mg total) by mouth every 6 (six) hours as needed for muscle spasms.   omeprazole 20 MG capsule Commonly known as:  PRILOSEC TAKE 1 CAPSULE DAILY   ondansetron 4 MG tablet Commonly known as:  ZOFRAN Take 1 tablet (4 mg total) by mouth every 6 (six) hours as needed for nausea.   senna 8.6 MG Tabs tablet Commonly known as:  SENOKOT Take 2 tablets (17.2 mg total) by mouth at bedtime.   tamsulosin 0.4 MG Caps capsule Commonly known as:  FLOMAX TAKE 1 CAPSULE DAILY            Durable Medical Equipment        Start     Ordered   12/13/16 1801  For home use only DME Walker rolling  Once    Question:  Patient needs a walker to treat with the following condition  Answer:  Status  post total hip replacement, right   12/13/16 1801      Diagnostic Studies: Dg Pelvis Portable  Result Date: 12/13/2016 CLINICAL DATA:  Status post right hip replacement today. EXAM: PORTABLE PELVIS 1-2 VIEWS COMPARISON:  Intraoperative imaging right hip today. FINDINGS: New total right hip arthroplasty is in place. The device is located. There is no fracture. Gas in the soft tissues from surgery is noted. IMPRESSION: Status post right total hip replacement.  No acute abnormality. Electronically Signed   By: Drusilla Kanner M.D.   On: 12/13/2016 10:59   Dg C-arm 61-120 Min  Result Date: 12/13/2016 CLINICAL DATA:  Status post anterior approach right total hip arthroplasty EXAM: DG C-ARM 61-120 MIN; OPERATIVE RIGHT HIP WITH PELVIS COMPARISON:  None in PACs FINDINGS: Fluoro time reported is 20 seconds. Two fluoro spot images are reviewed. The patient has undergone right total hip arthroplasty. Radiographic positioning of the prosthetic components is good. The interface with the native bone appears normal. IMPRESSION: No immediate complication following right total hip arthroplasty. Electronically Signed   By: David  [place] M.D.   On: 12/13/2016 09:34   Dg Hip Operative Unilat With Pelvis Right  Result Date: 12/13/2016 CLINICAL DATA:  Status post anterior approach right total hip arthroplasty EXAM: DG C-ARM 61-120 MIN; OPERATIVE RIGHT HIP WITH PELVIS COMPARISON:  None in PACs FINDINGS: Fluoro time reported is 20 seconds. Two fluoro spot images are reviewed. The patient has undergone right total hip arthroplasty. Radiographic positioning of the prosthetic components is good. The interface with the native bone appears normal. IMPRESSION: No immediate complication following right total hip arthroplasty. Electronically Signed   By: David  [place] M.D.   On: 12/13/2016 09:34    Disposition: 01-Home or Self Care  Discharge Instructions    Call MD / Call 911    Complete by:  As directed    If you  experience chest pain or shortness of breath, CALL 911 and be transported to the hospital emergency room.  If you develope a fever above 101 F, pus (white drainage) or increased drainage or redness at the wound, or calf pain, call your surgeon's office.   Constipation Prevention    Complete by:  As directed    Drink plenty of fluids.  Prune juice may be helpful.  You may use a stool softener, such as Colace (over the counter) 100 mg twice a day.  Use MiraLax (over the counter) for constipation as needed.   Diet - low sodium heart healthy    Complete by:  As directed    Driving restrictions    Complete by:  As directed    No driving for 6 weeks   Increase activity slowly as tolerated    Complete by:  As directed    Lifting restrictions    Complete by:  As directed    No lifting for 6 weeks   TED hose    Complete by:  As directed    Use stockings (TED hose) for 2 weeks on both leg(s).  You may remove them at night for sleeping.      Follow-up Information    Kamarrion Stfort, Arlys John, MD. Schedule an appointment as soon as possible for a visit in 2 week(s).   Specialty:  Orthopedic Surgery Why:  For wound re-check Contact information: [address] [place] [address] [phone]        [facility] Follow up.   Specialty:  Home Health Services Why:  Home Health Physical Therapy- agency will contact you to arrange initial appointment Contact information: [address] [place] [address] [phone]            Signed: Garnet Koyanagi 12/14/2016, 7:28 AM

## 2016-12-14 NOTE — Progress Notes (Signed)
Physical Therapy Treatment Patient Details Name: Brett [name] MRN: [id number] DOB: 1946/01/03 Today's Date: 12/14/2016    History of Present Illness Pt is a 71 y/o male s/p elective R THA, direct anterior approach. PMH includes HTN, DDD, OSA on CPAP, and rotator cuff surgery.     PT Comments    The patient is progressing exceptionally well. Plans Dc today. Reviewed exercises. No further PT needs at this time. Will need RW for DC.   Follow Up Recommendations  DC plan and follow up therapy as arranged by surgeon;Supervision/Assistance - 24 hour     Equipment Recommendations  Rolling walker with 5" wheels;3in1 (PT)    Recommendations for Other Services       Precautions / Restrictions Precautions Precautions: None Precaution Comments: Reviewed THA exercise  Restrictions RLE Weight Bearing: Weight bearing as tolerated    Mobility  Bed Mobility Overal bed mobility: Independent                Transfers Overall transfer level: Modified independent                  Ambulation/Gait Ambulation/Gait assistance: Modified independent (Device/Increase time) Ambulation Distance (Feet): 200 Feet Assistive device: Rolling walker (2 wheeled) Gait Pattern/deviations: Step-through pattern     General Gait Details: patient reports  right leg is longer   Stairs Stairs: Yes   Stair Management: One rail Left;Alternating pattern Number of Stairs: 2 General stair comments: instructed in 11 step at a time but patient proceeded to go reciprocal up and down.  Wheelchair Mobility    Modified Rankin (Stroke Patients Only)       Balance                                            Cognition Arousal/Alertness: Awake/alert                                            Exercises Total Joint Exercises Ankle Circles/Pumps: AROM;Both;10 reps;Supine Quad Sets: AROM;Right;10 reps;Supine Short Arc Quad: AROM;Right;10 reps;Supine Heel  Slides: AROM;Right;10 reps;Supine Hip ABduction/ADduction: AROM;Right;10 reps;Supine;Standing    General Comments        Pertinent Vitals/Pain Pain Score: 1  Pain Location: R hip  Pain Descriptors / Indicators: Sore Pain Intervention(s): Premedicated before session;Ice applied    Home Living                      Prior Function            PT Goals (current goals can now be found in the care plan section) Progress towards PT goals: Progressing toward goals    Frequency    7X/week      PT Plan Current plan remains appropriate    Co-evaluation              AM-PAC PT "6 Clicks" Daily Activity  Outcome Measure  Difficulty turning over in bed (including adjusting bedclothes, sheets and blankets)?: None Difficulty moving from lying on back to sitting on the side of the bed? : A Little Difficulty sitting down on and standing up from a chair with arms (e.g., wheelchair, bedside commode, etc,.)?: A Little Help needed moving to and from a bed to chair (including a wheelchair)?: None Help needed  walking in hospital room?: None Help needed climbing 3-5 steps with a railing? : None 6 Click Score: 22    End of Session   Activity Tolerance: Patient tolerated treatment well Patient left: in bed;with call bell/phone within reach Nurse Communication: Mobility status PT Visit Diagnosis: Other abnormalities of gait and mobility (R26.89);Pain Pain - Right/Left: Right Pain - part of body: Hip     Time: 0813-0827 PT Time Calculation (min) (ACUTE ONLY): 14 min  Charges:  $Gait Training: 8-22 mins                    G CodesBlanchard [name] PT [phone]    Rada Hay 12/14/2016, 8:33 AM

## 2016-12-14 NOTE — Progress Notes (Signed)
Pt ready for d/c home today per MD. Pt met PT goals, rolling walker was delivered to pt's room. Discharge instructions and prescriptions reviewed with pt and his wife, they denied questions.   Brett [name], Jerry Caras

## 2016-12-14 NOTE — Progress Notes (Signed)
   Subjective:  Patient reports pain as mild to moderate.  Denies N/V/CP/SOB.  Objective:   VITALS:   Vitals:   12/13/16 1241 12/13/16 1900 12/14/16 0005 12/14/16 0414  BP: (!) 150/67 133/64 127/65 (!) 144/70  Pulse: (!) 59 63 (!) 55 60  Resp: 18 18 20 20   Temp: 98.2 F (36.8 C) 99.3 F (37.4 C)  98.1 F (36.7 C)  TempSrc: Oral Oral  Oral  SpO2: 94% 94% 91% 95%  Weight: 85.2 kg (187 lb 12.6 oz)     Height: 5\' 8"  (1.727 m)       NAD ABD soft Sensation intact distally Intact pulses distally Dorsiflexion/Plantar flexion intact Incision: dressing C/D/I Compartment soft   Lab Results  Component Value Date   WBC 16.4 (H) 12/14/2016   HGB 11.3 (L) 12/14/2016   HCT 35.1 (L) 12/14/2016   MCV 83.6 12/14/2016   PLT 307 12/14/2016   BMET    Component Value Date/Time   NA 139 12/14/2016 0328   K 3.7 12/14/2016 0328   CL 106 12/14/2016 0328   CO2 27 12/14/2016 0328   GLUCOSE 160 (H) 12/14/2016 0328   BUN 12 12/14/2016 0328   CREATININE 0.93 12/14/2016 0328   CREATININE 0.94 09/24/2016 1603   CALCIUM 8.3 (L) 12/14/2016 0328   GFRNONAA >60 12/14/2016 0328   GFRNONAA 80 04/02/2014 0812   GFRAA >60 12/14/2016 0328   GFRAA >89 04/02/2014 0812     Assessment/Plan: 1 Day Post-Op   Principal Problem:   Osteoarthritis of right hip   WBAT with walker DVT ppx: ASA, SCDs, TEDs PO pain control PT/OT Dispo: D/C home with HEP   Kyrstin Campillo, Horald Pollen 12/14/2016, 7:25 AM   Rod Can, MD Cell [phone]

## 2016-12-15 DIAGNOSIS — M1611 Unilateral primary osteoarthritis, right hip: Secondary | ICD-10-CM | POA: Diagnosis not present

## 2016-12-15 DIAGNOSIS — R269 Unspecified abnormalities of gait and mobility: Secondary | ICD-10-CM | POA: Diagnosis not present

## 2016-12-29 DIAGNOSIS — Z96642 Presence of left artificial hip joint: Secondary | ICD-10-CM | POA: Diagnosis not present

## 2017-01-26 DIAGNOSIS — Z96641 Presence of right artificial hip joint: Secondary | ICD-10-CM | POA: Diagnosis not present

## 2017-01-26 DIAGNOSIS — Z471 Aftercare following joint replacement surgery: Secondary | ICD-10-CM | POA: Diagnosis not present

## 2017-01-28 ENCOUNTER — Encounter: Payer: Self-pay | Admitting: Physical Therapy

## 2017-01-28 NOTE — Therapy (Signed)
[facility] [address] [place], [place], [address] Phone: [phone]   Fax:  [phone]  Patient Details  Name: Camerin Ladouceur MRN: [id number] Date of Birth: April 01, 1946 Referring Provider:  No ref. provider found  Encounter Date: 01-31-17 PHYSICAL THERAPY DISCHARGE SUMMARY  Visits from Start of Care: 2  Current functional level related to goals / functional outcomes: Unable to reassess due to pt not returning for final visits   Remaining deficits: Dizziness, impaired balance   Education / Equipment: HEP  Plan: Patient agrees to discharge.  Patient goals were not met. Patient is being discharged due to not returning since the last visit.  ?????         G-Codes - 2017-01-31 1056    Functional Assessment Tool Used (Outpatient Only) DHI; clinical assessment   Functional Limitation Changing and maintaining body position   Changing and Maintaining Body Position Goal Status (O5056) 0 percent impaired, limited or restricted   Changing and Maintaining Body Position Discharge Status (R8893) 0 percent impaired, limited or restricted     Raylene Everts, PT, DPT 2017/01/31    10:57 AM    [facility] [address] [place], [place], [address] Phone: [phone]   Fax:  [phone]

## 2017-01-29 ENCOUNTER — Other Ambulatory Visit: Payer: Self-pay | Admitting: Family Medicine

## 2017-02-03 DIAGNOSIS — M25611 Stiffness of right shoulder, not elsewhere classified: Secondary | ICD-10-CM | POA: Diagnosis not present

## 2017-02-09 DIAGNOSIS — M19011 Primary osteoarthritis, right shoulder: Secondary | ICD-10-CM | POA: Diagnosis not present

## 2017-04-06 NOTE — H&P (Signed)
Brett [name] is an 71 y.o. male.    Chief Complaint: right shoulder pain  HPI: Pt is a 71 y.o. male complaining of right shoulder pain for multiple years. Pain had continually increased since the beginning. X-rays in the clinic show end-stage arthritic changes of the right shoulder. Pt has tried various conservative treatments which have failed to alleviate their symptoms, including injections and therapy. Various options are discussed with the patient. Risks, benefits and expectations were discussed with the patient. Patient understand the risks, benefits and expectations and wishes to proceed with surgery.   PCP:  Alycia Rossetti, MD  D/C Plans: Home  PMH: Past Medical History:  Diagnosis Date  . Allergy   . Arthritis   . Cataract   . GERD (gastroesophageal reflux disease)   . History of hiatal hernia   . Hyperlipidemia   . Pneumonia    hx. of  . PONV (postoperative nausea and vomiting)    with gallbladder surgery  . Sleep apnea     PSH: Past Surgical History:  Procedure Laterality Date  . CARPAL TUNNEL RELEASE Bilateral   . CHOLECYSTECTOMY  2004  . FRACTURE SURGERY  2006   fx. wrist  . JOINT REPLACEMENT    . rhino plasty     deviated septum  . SHOULDER SURGERY  2011   rotator cuff  . TOTAL HIP ARTHROPLASTY Right 12/13/2016   Procedure: RIGHT TOTAL HIP ARTHROPLASTY ANTERIOR APPROACH;  Surgeon: Rod Can, MD;  Location: [facility];  Service: Orthopedics;  Laterality: Right;  Marland Kitchen VASECTOMY  1980    Social History:  reports that he quit smoking about 6 years ago. he has never used smokeless tobacco. He reports that he does not drink alcohol or use drugs.  Allergies:  Allergies  Allergen Reactions  . Tramadol Nausea Only    Medications: No current facility-administered medications for this encounter.    Current Outpatient Medications  Medication Sig Dispense Refill  . aspirin 81 MG chewable tablet Chew 1 tablet (81 mg total) by mouth 2 (two) times daily. 60  tablet 1  . colestipol (COLESTID) 1 g tablet TAKE 1 TABLET TWICE A DAY 180 tablet 1  . diclofenac (VOLTAREN) 75 MG EC tablet TAKE 1 TABLET TWICE A DAY 180 tablet 2  . docusate sodium (COLACE) 100 MG capsule Take 1 capsule (100 mg total) by mouth 2 (two) times daily. 60 capsule 1  . HYDROcodone-acetaminophen (NORCO/VICODIN) 5-325 MG tablet Take 1-2 tablets by mouth every 4 (four) hours as needed (breakthrough pain). 60 tablet 0  . meclizine (ANTIVERT) 25 MG tablet Take 1 tablet (25 mg total) by mouth 2 (two) times daily as needed. (Patient not taking: Reported on 11/08/2016) 60 tablet 1  . methocarbamol (ROBAXIN) 500 MG tablet Take 1 tablet (500 mg total) by mouth every 6 (six) hours as needed for muscle spasms. 20 tablet 1  . omeprazole (PRILOSEC) 20 MG capsule TAKE 1 CAPSULE DAILY 90 capsule 2  . ondansetron (ZOFRAN) 4 MG tablet Take 1 tablet (4 mg total) by mouth every 6 (six) hours as needed for nausea. 20 tablet 0  . senna (SENOKOT) 8.6 MG TABS tablet Take 2 tablets (17.2 mg total) by mouth at bedtime. 120 each 0  . tamsulosin (FLOMAX) 0.4 MG CAPS capsule TAKE 1 CAPSULE DAILY 90 capsule 3    No results found for this or any previous visit (from the past 48 hour(s)). No results found.  ROS: Pain with rom of the right upper extremity  Physical  Exam:  Alert and oriented 71 y.o. male in no acute distress Cranial nerves 2-12 intact Cervical spine: full rom with no tenderness, nv intact distally Chest: active breath sounds bilaterally, no wheeze rhonchi or rales Heart: regular rate and rhythm, no murmur Abd: non tender non distended with active bowel sounds Hip is stable with rom  Right shoulder crepitus and pain with rom nv intact distally No rashes or edema  Assessment/Plan Assessment: right shoulder end stage osteoarthritis  Plan: Patient will undergo a right total shoulder by Dr. Veverly Fells at [facility]. Risks benefits and expectations were discussed with the patient. Patient  understand risks, benefits and expectations and wishes to proceed.

## 2017-04-11 NOTE — Pre-Procedure Instructions (Signed)
Brett [name]  04/11/2017      [id number] [name], [place] - [address] [place] [address] Phone: [phone] Fax: [phone]  [facility] - [name], [place] [address] Phone: [phone] Fax: [phone]  [name], [name] [address] [place] [address] Phone: [phone] Fax: [phone]    Your procedure is scheduled on November 30  Report to [facility] at 1300 P.M.  Call this number if you have problems the morning of surgery:  [phone]   Remember:  Do not eat food or drink liquids after midnight.  Continue all medications as directed by your physician except follow these medication instructions before surgery below   Take these medicines the morning of surgery with A SIP OF WATER  acetaminophen (TYLENOL omeprazole (PRILOSEC) tamsulosin (FLOMAX)  7 days prior to surgery STOP taking any Aspirin(unless otherwise instructed by your surgeon), Aleve, Naproxen, Ibuprofen, Motrin, Advil, Goody's, BC's, all herbal medications, fish oil, and all vitamins  Follow your doctors instructions regarding your Aspirin.  If no instructions were given by your doctor, then you will need to call the prescribing office office to get instructions.      Do not wear jewelry  Do not wear lotions, powders, or cologne, or deoderant.   Men may shave face and neck.  Do not bring valuables to the hospital.  [facility] is not responsible for any belongings or valuables.  Contacts, dentures or bridgework may not be worn into surgery.  Leave your suitcase in the car.  After surgery it may be brought to your room.  For patients admitted to the hospital, discharge time will be determined by your treatment team.  Patients discharged the day of surgery will not be  allowed to drive home.    Special instructions:   [facility]- Preparing For Surgery  Before surgery, you can play an important role. Because skin is not sterile, your skin needs to be as free of germs as possible. You can reduce the number of germs on your skin by washing with CHG (chlorahexidine gluconate) Soap before surgery.  CHG is an antiseptic cleaner which kills germs and bonds with the skin to continue killing germs even after washing.  Please do not use if you have an allergy to CHG or antibacterial soaps. If your skin becomes reddened/irritated stop using the CHG.  Do not shave (including legs and underarms) for at least 48 hours prior to first CHG shower. It is OK to shave your face.  Please follow these instructions carefully.   1. Shower the NIGHT BEFORE SURGERY and the MORNING OF SURGERY with CHG.   2. If you chose to wash your hair, wash your hair first as usual with your normal shampoo.  3. After you shampoo, rinse your hair and body thoroughly to remove the shampoo.  4. Use CHG as you would any other liquid soap. You can apply CHG directly to the skin and wash gently with a scrungie or a clean washcloth.   5. Apply the CHG Soap to your body ONLY FROM THE NECK DOWN.  Do not use on open wounds or open sores. Avoid contact with your eyes, ears, mouth and genitals (private parts). Wash Face and genitals (private parts)  with your normal soap.  6. Wash thoroughly, paying special attention to the area where your surgery will be performed.  7. Thoroughly rinse your body with warm water from the neck down.  8. DO NOT shower/wash with your normal soap after using and rinsing off the CHG Soap.  9. Pat yourself dry with a CLEAN TOWEL.  10. Wear CLEAN PAJAMAS to bed the night before surgery, wear comfortable clothes the morning of surgery  11. Place CLEAN SHEETS on your bed the night of your first shower and DO NOT SLEEP WITH PETS.    Day of Surgery: Do not apply any  deodorants/lotions. Please wear clean clothes to the hospital/surgery center.      Please read over the following fact sheets that you were given.

## 2017-04-12 ENCOUNTER — Other Ambulatory Visit: Payer: Self-pay

## 2017-04-12 ENCOUNTER — Encounter (HOSPITAL_COMMUNITY): Payer: Self-pay

## 2017-04-12 ENCOUNTER — Encounter (HOSPITAL_COMMUNITY)
Admission: RE | Admit: 2017-04-12 | Discharge: 2017-04-12 | Disposition: A | Discharge status: Home / Self Care | Payer: Medicare HMO | Source: Ambulatory Visit | Attending: Orthopedic Surgery | Admitting: Orthopedic Surgery

## 2017-04-12 LAB — BASIC METABOLIC PANEL
Anion gap: 7 (ref 5–15)
BUN: 9 mg/dL (ref 6–20)
CO2: 27 mmol/L (ref 22–32)
Calcium: 8.9 mg/dL (ref 8.9–10.3)
Chloride: 106 mmol/L (ref 101–111)
Creatinine, Ser: 0.96 mg/dL (ref 0.61–1.24)
GFR calc Af Amer: >60 mL/min (ref >60)
GFR calc non Af Amer: >60 mL/min (ref >60)
Glucose, Bld: 117 mg/dL — ABNORMAL HIGH (ref 65–99)
Potassium: 3.8 mmol/L (ref 3.5–5.1)
Sodium: 140 mmol/L (ref 135–145)

## 2017-04-12 LAB — CBC
HCT: 43.4 % (ref 39.0–52.0)
Hemoglobin: 14.2 g/dL (ref 13.0–17.0)
MCH: 27.2 pg (ref 26.0–34.0)
MCHC: 32.7 g/dL (ref 30.0–36.0)
MCV: 83 fL (ref 78.0–100.0)
Platelets: 270 10*3/uL (ref 150–400)
RBC: 5.23 MIL/uL (ref 4.22–5.81)
RDW: 13.7 % (ref 11.5–15.5)
WBC: 8.2 10*3/uL (ref 4.0–10.5)

## 2017-04-12 LAB — SURGICAL PCR SCREEN
MRSA, PCR: NEGATIVE
Staphylococcus aureus: NEGATIVE

## 2017-04-12 NOTE — Progress Notes (Signed)
PCP - [facility] Cardiologist - denies  Chest x-ray - not needed EKG - 09/24/16 Stress Test - denies ECHO - denies Cardiac Cath - denies  Sleep Study - 2009  CPAP - instructed patient to bring with him the day of surgery   Aspirin Instructions: stopped aspirin a year ago  Anesthesia review: NO  Patient denies shortness of breath, fever, cough and chest pain at PAT appointment   Patient verbalized understanding of instructions that were given to them at the PAT appointment. Patient was also instructed that they will need to review over the PAT instructions again at home before surgery.

## 2017-04-14 NOTE — Progress Notes (Signed)
Anesthesia Chart Review:  Pt is a 71 year old male scheduled for R total shoulder arthroplasty on 04/15/2017 with Netta Cedars, MD  - PCP is Vic Blackbird, MD who cleared pt for surgery  PMH includes:  Hyperlipidemia, OSA, post-op N/V, GERD. Former smoker. BMI 2.5. S/p R THA 12/13/16  Medications include: Colestipol, Prilosec  Preoperative labs reviewed.  EKG 09/24/16: Sinus bradycardia (59 bpm)  Holter monitor 09/28/16: No bradycardia occasional PVCs  If no changes, I anticipate pt can proceed with surgery as scheduled.   Willeen Cass, FNP-BC [facility]/Anesthesiology Phone: [phone] 04/14/2017 4:11 PM

## 2017-04-15 ENCOUNTER — Inpatient Hospital Stay (HOSPITAL_COMMUNITY)
Admission: RE | Admit: 2017-04-15 | Discharge: 2017-04-16 | DRG: 483 | Disposition: A | Discharge status: Home / Self Care | Payer: Medicare HMO | Source: Ambulatory Visit | Attending: Orthopedic Surgery | Admitting: Orthopedic Surgery

## 2017-04-15 ENCOUNTER — Inpatient Hospital Stay (HOSPITAL_COMMUNITY): Payer: Medicare HMO | Admitting: Anesthesiology

## 2017-04-15 ENCOUNTER — Encounter (HOSPITAL_COMMUNITY)
Admission: RE | Disposition: A | Discharge status: Home / Self Care | Payer: Self-pay | Source: Ambulatory Visit | Attending: Orthopedic Surgery

## 2017-04-15 ENCOUNTER — Encounter (HOSPITAL_COMMUNITY): Payer: Self-pay | Admitting: Anesthesiology

## 2017-04-15 ENCOUNTER — Inpatient Hospital Stay (HOSPITAL_COMMUNITY): Payer: Medicare HMO | Admitting: Emergency Medicine

## 2017-04-15 ENCOUNTER — Inpatient Hospital Stay (HOSPITAL_COMMUNITY): Payer: Medicare HMO

## 2017-04-15 DIAGNOSIS — Z96641 Presence of right artificial hip joint: Secondary | ICD-10-CM | POA: Diagnosis present

## 2017-04-15 DIAGNOSIS — Z885 Allergy status to narcotic agent status: Secondary | ICD-10-CM

## 2017-04-15 DIAGNOSIS — K219 Gastro-esophageal reflux disease without esophagitis: Secondary | ICD-10-CM | POA: Diagnosis not present

## 2017-04-15 DIAGNOSIS — Z87891 Personal history of nicotine dependence: Secondary | ICD-10-CM

## 2017-04-15 DIAGNOSIS — G8918 Other acute postprocedural pain: Secondary | ICD-10-CM | POA: Diagnosis not present

## 2017-04-15 DIAGNOSIS — E785 Hyperlipidemia, unspecified: Secondary | ICD-10-CM | POA: Diagnosis present

## 2017-04-15 DIAGNOSIS — M19011 Primary osteoarthritis, right shoulder: Secondary | ICD-10-CM | POA: Diagnosis not present

## 2017-04-15 DIAGNOSIS — G4733 Obstructive sleep apnea (adult) (pediatric): Secondary | ICD-10-CM | POA: Diagnosis present

## 2017-04-15 DIAGNOSIS — Z7982 Long term (current) use of aspirin: Secondary | ICD-10-CM | POA: Diagnosis not present

## 2017-04-15 DIAGNOSIS — Z96611 Presence of right artificial shoulder joint: Secondary | ICD-10-CM | POA: Diagnosis not present

## 2017-04-15 DIAGNOSIS — Z9049 Acquired absence of other specified parts of digestive tract: Secondary | ICD-10-CM | POA: Diagnosis not present

## 2017-04-15 DIAGNOSIS — Z471 Aftercare following joint replacement surgery: Secondary | ICD-10-CM | POA: Diagnosis not present

## 2017-04-15 HISTORY — PX: TOTAL SHOULDER ARTHROPLASTY: SHX126

## 2017-04-15 SURGERY — ARTHROPLASTY, SHOULDER, TOTAL
Anesthesia: General | Site: Shoulder | Laterality: Right

## 2017-04-15 MED ORDER — SUGAMMADEX SODIUM 200 MG/2ML IV SOLN
INTRAVENOUS | Status: AC
  Filled 2017-04-15: qty 2

## 2017-04-15 MED ORDER — CEFAZOLIN SODIUM-DEXTROSE 2-4 GM/100ML-% IV SOLN
2.0000 g | Freq: Four times a day (QID) | INTRAVENOUS | Status: AC
  Administered 2017-04-15 – 2017-04-16 (×3): 2 g via INTRAVENOUS
  Filled 2017-04-15 (×3): qty 100

## 2017-04-15 MED ORDER — DEXAMETHASONE SODIUM PHOSPHATE 10 MG/ML IJ SOLN
INTRAMUSCULAR | Status: AC
  Filled 2017-04-15: qty 1

## 2017-04-15 MED ORDER — PHENYLEPHRINE HCL 10 MG/ML IJ SOLN
INTRAVENOUS | Status: DC | PRN
  Administered 2017-04-15: 50 ug/min via INTRAVENOUS

## 2017-04-15 MED ORDER — SENNA 8.6 MG PO TABS
2.0000 | ORAL_TABLET | Freq: Every day | ORAL | Status: DC
  Administered 2017-04-15: 17.2 mg via ORAL
  Filled 2017-04-15: qty 2

## 2017-04-15 MED ORDER — METHOCARBAMOL 500 MG PO TABS
500.0000 mg | ORAL_TABLET | Freq: Four times a day (QID) | ORAL | Status: DC | PRN
  Administered 2017-04-15 – 2017-04-16 (×2): 500 mg via ORAL
  Filled 2017-04-15 (×3): qty 1

## 2017-04-15 MED ORDER — MIDAZOLAM HCL 2 MG/2ML IJ SOLN
INTRAMUSCULAR | Status: AC
  Administered 2017-04-15: 2 mg via INTRAVENOUS
  Filled 2017-04-15: qty 2

## 2017-04-15 MED ORDER — DOCUSATE SODIUM 100 MG PO CAPS: 100.0000 mg | ORAL_CAPSULE | Freq: Two times a day (BID) | ORAL | Status: DC

## 2017-04-15 MED ORDER — ROCURONIUM BROMIDE 100 MG/10ML IV SOLN
INTRAVENOUS | Status: DC | PRN
  Administered 2017-04-15: 50 mg via INTRAVENOUS

## 2017-04-15 MED ORDER — METOCLOPRAMIDE HCL 5 MG/ML IJ SOLN
5.0000 mg | Freq: Three times a day (TID) | INTRAMUSCULAR | Status: DC | PRN
Start: 2017-04-15 — End: 2017-04-16

## 2017-04-15 MED ORDER — BISACODYL 10 MG RE SUPP: 10.0000 mg | Freq: Every day | RECTAL | Status: DC | PRN

## 2017-04-15 MED ORDER — BUPIVACAINE-EPINEPHRINE 0.25% -1:200000 IJ SOLN
INTRAMUSCULAR | Status: DC | PRN
  Administered 2017-04-15: 9 mL

## 2017-04-15 MED ORDER — PROMETHAZINE HCL 25 MG/ML IJ SOLN: 6.2500 mg | INTRAMUSCULAR | Status: DC | PRN

## 2017-04-15 MED ORDER — ACETAMINOPHEN 500 MG PO TABS: 1000.0000 mg | ORAL_TABLET | Freq: Three times a day (TID) | ORAL | Status: DC | PRN

## 2017-04-15 MED ORDER — DICLOFENAC SODIUM 75 MG PO TBEC
75.0000 mg | DELAYED_RELEASE_TABLET | Freq: Two times a day (BID) | ORAL | Status: DC
  Administered 2017-04-16: 75 mg via ORAL
  Filled 2017-04-15 (×2): qty 1

## 2017-04-15 MED ORDER — EPHEDRINE SULFATE-NACL 50-0.9 MG/10ML-% IV SOSY
PREFILLED_SYRINGE | INTRAVENOUS | Status: DC | PRN
  Administered 2017-04-15: 10 mg via INTRAVENOUS

## 2017-04-15 MED ORDER — OXYCODONE-ACETAMINOPHEN 7.5-325 MG PO TABS: 1.0000 | ORAL_TABLET | ORAL | 0 refills | Status: DC | PRN

## 2017-04-15 MED ORDER — FENTANYL CITRATE (PF) 100 MCG/2ML IJ SOLN
100.0000 ug | Freq: Once | INTRAMUSCULAR | Status: AC
  Administered 2017-04-15: 100 ug via INTRAVENOUS

## 2017-04-15 MED ORDER — ONDANSETRON HCL 4 MG/2ML IJ SOLN
INTRAMUSCULAR | Status: AC
  Filled 2017-04-15: qty 2

## 2017-04-15 MED ORDER — OXYCODONE HCL 5 MG PO TABS
5.0000 mg | ORAL_TABLET | ORAL | Status: DC | PRN
  Administered 2017-04-15: 5 mg via ORAL
  Filled 2017-04-15: qty 1

## 2017-04-15 MED ORDER — DOCUSATE SODIUM 100 MG PO CAPS
100.0000 mg | ORAL_CAPSULE | Freq: Two times a day (BID) | ORAL | Status: DC
  Administered 2017-04-15 – 2017-04-16 (×2): 100 mg via ORAL
  Filled 2017-04-15 (×2): qty 1

## 2017-04-15 MED ORDER — COLESTIPOL HCL 1 G PO TABS
1.0000 g | ORAL_TABLET | Freq: Two times a day (BID) | ORAL | Status: DC
  Administered 2017-04-15 – 2017-04-16 (×2): 1 g via ORAL
  Filled 2017-04-15 (×2): qty 1

## 2017-04-15 MED ORDER — CHLORHEXIDINE GLUCONATE 4 % EX LIQD: 60.0000 mL | Freq: Once | CUTANEOUS | Status: DC

## 2017-04-15 MED ORDER — ONDANSETRON HCL 4 MG/2ML IJ SOLN: 4.0000 mg | Freq: Four times a day (QID) | INTRAMUSCULAR | Status: DC | PRN

## 2017-04-15 MED ORDER — 0.9 % SODIUM CHLORIDE (POUR BTL) OPTIME
TOPICAL | Status: DC | PRN
  Administered 2017-04-15: 1000 mL

## 2017-04-15 MED ORDER — THROMBIN 20000 UNITS EX KIT
PACK | CUTANEOUS | Status: AC
  Filled 2017-04-15: qty 1

## 2017-04-15 MED ORDER — ACETAMINOPHEN 325 MG PO TABS: 650.0000 mg | ORAL_TABLET | ORAL | Status: DC | PRN

## 2017-04-15 MED ORDER — METHOCARBAMOL 500 MG PO TABS: 500.0000 mg | ORAL_TABLET | Freq: Three times a day (TID) | ORAL | 1 refills | Status: DC | PRN

## 2017-04-15 MED ORDER — MENTHOL 3 MG MT LOZG: 1.0000 | LOZENGE | OROMUCOSAL | Status: DC | PRN

## 2017-04-15 MED ORDER — BUPIVACAINE-EPINEPHRINE (PF) 0.5% -1:200000 IJ SOLN
INTRAMUSCULAR | Status: DC | PRN
  Administered 2017-04-15: 30 mL via PERINEURAL

## 2017-04-15 MED ORDER — LACTATED RINGERS IV SOLN
INTRAVENOUS | Status: DC
  Administered 2017-04-15 (×2): via INTRAVENOUS

## 2017-04-15 MED ORDER — FENTANYL CITRATE (PF) 250 MCG/5ML IJ SOLN
INTRAMUSCULAR | Status: AC
  Filled 2017-04-15: qty 5

## 2017-04-15 MED ORDER — ASPIRIN 81 MG PO CHEW
81.0000 mg | CHEWABLE_TABLET | Freq: Two times a day (BID) | ORAL | Status: DC
  Administered 2017-04-15 – 2017-04-16 (×2): 81 mg via ORAL
  Filled 2017-04-15 (×2): qty 1

## 2017-04-15 MED ORDER — BUPIVACAINE-EPINEPHRINE (PF) 0.25% -1:200000 IJ SOLN
INTRAMUSCULAR | Status: AC
  Filled 2017-04-15: qty 30

## 2017-04-15 MED ORDER — PANTOPRAZOLE SODIUM 40 MG PO TBEC
40.0000 mg | DELAYED_RELEASE_TABLET | Freq: Every day | ORAL | Status: DC
  Administered 2017-04-16: 40 mg via ORAL
  Filled 2017-04-15: qty 1

## 2017-04-15 MED ORDER — LIDOCAINE 2% (20 MG/ML) 5 ML SYRINGE
INTRAMUSCULAR | Status: AC
  Filled 2017-04-15: qty 5

## 2017-04-15 MED ORDER — SODIUM CHLORIDE 0.9 % IV SOLN: INTRAVENOUS | Status: DC

## 2017-04-15 MED ORDER — EPHEDRINE 5 MG/ML INJ
INTRAVENOUS | Status: AC
  Filled 2017-04-15: qty 10

## 2017-04-15 MED ORDER — METOCLOPRAMIDE HCL 5 MG PO TABS: 5.0000 mg | ORAL_TABLET | Freq: Three times a day (TID) | ORAL | Status: DC | PRN

## 2017-04-15 MED ORDER — HYDROMORPHONE HCL 1 MG/ML IJ SOLN: 0.2500 mg | INTRAMUSCULAR | Status: DC | PRN

## 2017-04-15 MED ORDER — PROPOFOL 10 MG/ML IV BOLUS
INTRAVENOUS | Status: DC | PRN
  Administered 2017-04-15: 180 mg via INTRAVENOUS

## 2017-04-15 MED ORDER — THROMBIN 20000 UNITS EX KIT
PACK | CUTANEOUS | Status: DC | PRN
  Administered 2017-04-15: 20000 [IU] via TOPICAL

## 2017-04-15 MED ORDER — TAMSULOSIN HCL 0.4 MG PO CAPS
0.4000 mg | ORAL_CAPSULE | Freq: Every day | ORAL | Status: DC
Start: 2017-04-16 — End: 2017-04-16
  Administered 2017-04-16: 0.4 mg via ORAL
  Filled 2017-04-15: qty 1

## 2017-04-15 MED ORDER — CEFAZOLIN SODIUM-DEXTROSE 2-4 GM/100ML-% IV SOLN
2.0000 g | INTRAVENOUS | Status: AC
  Administered 2017-04-15: 2 g via INTRAVENOUS

## 2017-04-15 MED ORDER — METHOCARBAMOL 500 MG PO TABS: 500.0000 mg | ORAL_TABLET | Freq: Four times a day (QID) | ORAL | Status: DC | PRN

## 2017-04-15 MED ORDER — MIDAZOLAM HCL 2 MG/2ML IJ SOLN
2.0000 mg | Freq: Once | INTRAMUSCULAR | Status: AC
  Administered 2017-04-15: 2 mg via INTRAVENOUS

## 2017-04-15 MED ORDER — SCOPOLAMINE 1 MG/3DAYS TD PT72
1.0000 | MEDICATED_PATCH | TRANSDERMAL | Status: DC
  Administered 2017-04-15: 1.5 mg via TRANSDERMAL
  Filled 2017-04-15: qty 1

## 2017-04-15 MED ORDER — ACETAMINOPHEN 650 MG RE SUPP: 650.0000 mg | RECTAL | Status: DC | PRN

## 2017-04-15 MED ORDER — SUGAMMADEX SODIUM 200 MG/2ML IV SOLN
INTRAVENOUS | Status: DC | PRN
  Administered 2017-04-15: 164.6 mg via INTRAVENOUS

## 2017-04-15 MED ORDER — FENTANYL CITRATE (PF) 100 MCG/2ML IJ SOLN
INTRAMUSCULAR | Status: DC | PRN
  Administered 2017-04-15: 100 ug via INTRAVENOUS

## 2017-04-15 MED ORDER — PHENOL 1.4 % MT LIQD: 1.0000 | OROMUCOSAL | Status: DC | PRN

## 2017-04-15 MED ORDER — ROCURONIUM BROMIDE 10 MG/ML (PF) SYRINGE
PREFILLED_SYRINGE | INTRAVENOUS | Status: AC
  Filled 2017-04-15: qty 5

## 2017-04-15 MED ORDER — DEXAMETHASONE SODIUM PHOSPHATE 10 MG/ML IJ SOLN
INTRAMUSCULAR | Status: DC | PRN
  Administered 2017-04-15: 10 mg via INTRAVENOUS

## 2017-04-15 MED ORDER — POLYETHYLENE GLYCOL 3350 17 G PO PACK: 17.0000 g | PACK | Freq: Every day | ORAL | Status: DC | PRN

## 2017-04-15 MED ORDER — ONDANSETRON HCL 4 MG PO TABS: 4.0000 mg | ORAL_TABLET | Freq: Four times a day (QID) | ORAL | Status: DC | PRN

## 2017-04-15 MED ORDER — ONDANSETRON HCL 4 MG PO TABS: 4.0000 mg | ORAL_TABLET | Freq: Three times a day (TID) | ORAL | 0 refills | Status: DC | PRN

## 2017-04-15 MED ORDER — ONDANSETRON HCL 4 MG/2ML IJ SOLN
INTRAMUSCULAR | Status: DC | PRN
  Administered 2017-04-15: 4 mg via INTRAVENOUS

## 2017-04-15 MED ORDER — CEFAZOLIN SODIUM-DEXTROSE 2-4 GM/100ML-% IV SOLN
INTRAVENOUS | Status: AC
  Filled 2017-04-15: qty 100

## 2017-04-15 MED ORDER — FENTANYL CITRATE (PF) 100 MCG/2ML IJ SOLN
INTRAMUSCULAR | Status: AC
  Administered 2017-04-15: 100 ug via INTRAVENOUS
  Filled 2017-04-15: qty 2

## 2017-04-15 MED ORDER — METHOCARBAMOL 1000 MG/10ML IJ SOLN
500.0000 mg | Freq: Four times a day (QID) | INTRAVENOUS | Status: DC | PRN
  Filled 2017-04-15: qty 5

## 2017-04-15 SURGICAL SUPPLY — 68 items
BIT DRILL 5/64X5 DISP (BIT) ×2 IMPLANT
BLADE SAW SAG 73X25 THK (BLADE) ×1
BLADE SAW SGTL 73X25 THK (BLADE) ×1 IMPLANT
BUR SURG 4X8 MED (BURR) IMPLANT
BURR SURG 4X8 MED (BURR)
CAPT HIP TOTAL 2 ×1 IMPLANT
CEMENT HV SMART SET (Cement) ×1 IMPLANT
COVER SURGICAL LIGHT HANDLE (MISCELLANEOUS) ×2 IMPLANT
DRAPE IMP U-DRAPE 54X76 (DRAPES) ×2 IMPLANT
DRAPE INCISE IOBAN 66X45 STRL (DRAPES) ×4 IMPLANT
DRAPE ORTHO SPLIT 77X108 STRL (DRAPES) ×4
DRAPE SURG ORHT 6 SPLT 77X108 (DRAPES) ×2 IMPLANT
DRAPE U-SHAPE 47X51 STRL (DRAPES) ×2 IMPLANT
DRSG ADAPTIC 3X8 NADH LF (GAUZE/BANDAGES/DRESSINGS) ×2 IMPLANT
DRSG PAD ABDOMINAL 8X10 ST (GAUZE/BANDAGES/DRESSINGS) ×4 IMPLANT
DURAPREP 26ML APPLICATOR (WOUND CARE) ×2 IMPLANT
ELECT BLADE 4.0 EZ CLEAN MEGAD (MISCELLANEOUS) ×2
ELECT NDL TIP 2.8 STRL (NEEDLE) ×1 IMPLANT
ELECT NEEDLE TIP 2.8 STRL (NEEDLE) ×2 IMPLANT
ELECT REM PT RETURN 9FT ADLT (ELECTROSURGICAL) ×2
ELECTRODE BLDE 4.0 EZ CLN MEGD (MISCELLANEOUS) ×1 IMPLANT
ELECTRODE REM PT RTRN 9FT ADLT (ELECTROSURGICAL) ×1 IMPLANT
GAUZE SPONGE 4X4 12PLY STRL (GAUZE/BANDAGES/DRESSINGS) ×2 IMPLANT
GLOVE BIOGEL PI ORTHO PRO 7.5 (GLOVE) ×2
GLOVE BIOGEL PI ORTHO PRO SZ8 (GLOVE) ×2
GLOVE ORTHO TXT STRL SZ7.5 (GLOVE) ×2 IMPLANT
GLOVE PI ORTHO PRO STRL 7.5 (GLOVE) ×1 IMPLANT
GLOVE PI ORTHO PRO STRL SZ8 (GLOVE) ×1 IMPLANT
GLOVE SURG ORTHO 8.5 STRL (GLOVE) ×4 IMPLANT
GOWN STRL REUS W/ TWL LRG LVL3 (GOWN DISPOSABLE) IMPLANT
GOWN STRL REUS W/ TWL XL LVL3 (GOWN DISPOSABLE) ×3 IMPLANT
GOWN STRL REUS W/TWL LRG LVL3 (GOWN DISPOSABLE) ×4
GOWN STRL REUS W/TWL XL LVL3 (GOWN DISPOSABLE) ×6
KIT BASIN OR (CUSTOM PROCEDURE TRAY) ×2 IMPLANT
KIT ROOM TURNOVER OR (KITS) ×2 IMPLANT
MANIFOLD NEPTUNE II (INSTRUMENTS) ×2 IMPLANT
NDL 1/2 CIR MAYO (NEEDLE) ×1 IMPLANT
NDL HYPO 25GX1X1/2 BEV (NEEDLE) ×1 IMPLANT
NDL SUT 6 .5 CRC .975X.05 MAYO (NEEDLE) ×1 IMPLANT
NEEDLE 1/2 CIR MAYO (NEEDLE) ×2 IMPLANT
NEEDLE HYPO 25GX1X1/2 BEV (NEEDLE) ×2 IMPLANT
NEEDLE MAYO TAPER (NEEDLE) ×2
NS IRRIG 1000ML POUR BTL (IV SOLUTION) ×2 IMPLANT
PACK SHOULDER (CUSTOM PROCEDURE TRAY) ×2 IMPLANT
PAD ARMBOARD 7.5X6 YLW CONV (MISCELLANEOUS) ×4 IMPLANT
SLING ARM FOAM STRAP LRG (SOFTGOODS) ×1 IMPLANT
SMARTMIX MINI TOWER (MISCELLANEOUS) ×2
SPONGE LAP 18X18 X RAY DECT (DISPOSABLE) ×2 IMPLANT
SPONGE LAP 4X18 X RAY DECT (DISPOSABLE) ×2 IMPLANT
SPONGE SURGIFOAM ABS GEL SZ50 (HEMOSTASIS) ×1 IMPLANT
STRIP CLOSURE SKIN 1/2X4 (GAUZE/BANDAGES/DRESSINGS) ×2 IMPLANT
SUCTION FRAZIER HANDLE 10FR (MISCELLANEOUS) ×1
SUCTION TUBE FRAZIER 10FR DISP (MISCELLANEOUS) ×1 IMPLANT
SUT FIBERWIRE #2 38 T-5 BLUE (SUTURE) ×6
SUT MNCRL AB 4-0 PS2 18 (SUTURE) ×2 IMPLANT
SUT VIC AB 0 CT1 27 (SUTURE) ×4
SUT VIC AB 0 CT1 27XBRD ANBCTR (SUTURE) ×1 IMPLANT
SUT VIC AB 0 CT2 27 (SUTURE) ×2 IMPLANT
SUT VIC AB 2-0 CT1 27 (SUTURE) ×2
SUT VIC AB 2-0 CT1 TAPERPNT 27 (SUTURE) ×1 IMPLANT
SUT VICRYL AB 2 0 TIES (SUTURE) ×2 IMPLANT
SUTURE FIBERWR #2 38 T-5 BLUE (SUTURE) ×2 IMPLANT
SYR CONTROL 10ML LL (SYRINGE) ×2 IMPLANT
TOWEL OR 17X24 6PK STRL BLUE (TOWEL DISPOSABLE) ×1 IMPLANT
TOWEL OR 17X26 10 PK STRL BLUE (TOWEL DISPOSABLE) ×2 IMPLANT
TOWER SMARTMIX MINI (MISCELLANEOUS) ×1 IMPLANT
TRAY FOLEY W/METER SILVER 16FR (SET/KITS/TRAYS/PACK) ×1 IMPLANT
YANKAUER SUCT BULB TIP NO VENT (SUCTIONS) ×1 IMPLANT

## 2017-04-15 NOTE — Brief Op Note (Signed)
04/15/2017  6:06 PM  PATIENT:  Brett [name]  71 y.o. male  PRE-OPERATIVE DIAGNOSIS:  Right shoulder osteoarthritis, end stage  POST-OPERATIVE DIAGNOSIS:  Right shoulder osteoarthritis, end stage  PROCEDURE:  Procedure(s): RIGHT TOTAL SHOULDER ARTHROPLASTY (Right) [employer]  (12 stem/ 10 metaphysis) 48x21 eccentric head, 48 APG glenoid  SURGEON:  Surgeon(s) and Role:    Netta Cedars, MD - Primary  PHYSICIAN ASSISTANT:   ASSISTANTS: Ventura Bruns, PA-C   ANESTHESIA:   regional and general  EBL:  250 cc   BLOOD ADMINISTERED:none  DRAINS: none   LOCAL MEDICATIONS USED:  MARCAINE     SPECIMEN:  No Specimen  DISPOSITION OF SPECIMEN:  N/A  COUNTS:  YES  TOURNIQUET:  * No tourniquets in log *  DICTATION: .Other Dictation: Dictation Number [id number]  PLAN OF CARE: Admit to inpatient   PATIENT DISPOSITION:  PACU - hemodynamically stable.   Delay start of Pharmacological VTE agent (>24hrs) due to surgical blood loss or risk of bleeding: not applicable

## 2017-04-15 NOTE — Interval H&P Note (Signed)
History and Physical Interval Note:  04/15/2017 2:11 PM  Brett [name]  has presented today for surgery, with the diagnosis of Right shoulder osteoarthritis  The various methods of treatment have been discussed with the patient and family. After consideration of risks, benefits and other options for treatment, the patient has consented to  Procedure(s): RIGHT TOTAL SHOULDER ARTHROPLASTY (Right) as a surgical intervention .  The patient's history has been reviewed, patient examined, no change in status, stable for surgery.  I have reviewed the patient's chart and labs.  Questions were answered to the patient's satisfaction.     Shakesha Soltau,STEVEN R

## 2017-04-15 NOTE — Anesthesia Postprocedure Evaluation (Signed)
Anesthesia Post Note  [name]  Procedure(s) Performed: RIGHT TOTAL SHOULDER ARTHROPLASTY (Right Shoulder)     Patient location during evaluation: PACU Anesthesia Type: General Level of consciousness: awake and alert Pain management: pain level controlled Vital Signs Assessment: post-procedure vital signs reviewed and stable Respiratory status: spontaneous breathing, nonlabored ventilation and respiratory function stable Cardiovascular status: blood pressure returned to baseline and stable Postop Assessment: no apparent nausea or vomiting Anesthetic complications: no    Last Vitals:  Vitals:   04/15/17 1855 04/15/17 1930  BP: (!) 143/60 140/77  Pulse: 92 (!) 101  Resp: 14 20  Temp: 36.4 C 36.6 C  SpO2: 94% 95%    Last Pain:  Vitals:   04/15/17 1930  TempSrc: Oral  PainSc:                  Catalina Gravel

## 2017-04-15 NOTE — Anesthesia Preprocedure Evaluation (Addendum)
Anesthesia Evaluation  Patient identified by MRN, date of birth, ID band Patient awake    Reviewed: Allergy & Precautions, H&P , Patient's Chart, lab work & pertinent test results, reviewed documented beta blocker date and time   History of Anesthesia Complications (+) PONV and history of anesthetic complications  Airway Mallampati: II  TM Distance: >3 FB Neck ROM: full    Dental no notable dental hx. (+) Dental Advisory Given   Pulmonary sleep apnea , former smoker,    Pulmonary exam normal breath sounds clear to auscultation       Cardiovascular hypertension,  Rhythm:regular Rate:Normal     Neuro/Psych History of RSD after wrist surgery. negative psych ROS   GI/Hepatic hiatal hernia, PUD, GERD  Medicated,  Endo/Other    Renal/GU      Musculoskeletal  (+) Arthritis ,   Abdominal   Peds  Hematology   Anesthesia Other Findings   Reproductive/Obstetrics                            Anesthesia Physical  Anesthesia Plan  ASA: II  Anesthesia Plan: General   Post-op Pain Management: GA combined w/ Regional for post-op pain   Induction: Intravenous  PONV Risk Score and Plan: 4 or greater and Ondansetron, Dexamethasone, Scopolamine patch - Pre-op and Diphenhydramine  Airway Management Planned: Oral ETT  Additional Equipment:   Intra-op Plan:   Post-operative Plan: Extubation in OR  Informed Consent: I have reviewed the patients History and Physical, chart, labs and discussed the procedure including the risks, benefits and alternatives for the proposed anesthesia with the patient or authorized representative who has indicated his/her understanding and acceptance.   Dental Advisory Given  Plan Discussed with: CRNA, Surgeon and Anesthesiologist  Anesthesia Plan Comments: (  )        Anesthesia Quick Evaluation

## 2017-04-15 NOTE — Plan of Care (Signed)
  Progressing Safety: Ability to remain free from injury will improve 04/15/2017 2004 - Progressing by Charlena Cross, RN Education: Knowledge of the prescribed therapeutic regimen will improve 04/15/2017 2004 - Progressing by Charlena Cross, RN Understanding of activity limitations/precautions following surgery will improve 04/15/2017 2004 - Progressing by Charlena Cross, RN Activity: Ability to tolerate increased activity will improve 04/15/2017 2004 - Progressing by Charlena Cross, RN Pain Management: Pain level will decrease with appropriate interventions 04/15/2017 2004 - Progressing by Charlena Cross, RN Elimination: Will not experience complications related to bowel motility 04/15/2017 2004 - Progressing by Charlena Cross, RN Will not experience complications related to urinary retention 04/15/2017 2004 - Progressing by Charlena Cross, RN

## 2017-04-15 NOTE — Transfer of Care (Signed)
Immediate Anesthesia Transfer of Care Note  [name]  Procedure(s) Performed: RIGHT TOTAL SHOULDER ARTHROPLASTY (Right Shoulder)  Patient Location: PACU  Anesthesia Type:GA combined with regional for post-op pain  Level of Consciousness: awake and alert   Airway & Oxygen Therapy: Patient Spontanous Breathing and Patient connected to nasal cannula oxygen  Post-op Assessment: Report given to RN and Post -op Vital signs reviewed and stable  Post vital signs: Reviewed and stable  Last Vitals:  Vitals:   04/15/17 1310 04/15/17 1830  BP: (!) 173/81 114/60  Pulse: 73   Resp: 11 19  Temp:  (!) 36.1 C  SpO2: 96% 96%    Last Pain:  Vitals:   04/15/17 1830  TempSrc:   PainSc: 0-No pain      Patients Stated Pain Goal: 3 (96/04/54 0981)  Complications: No apparent anesthesia complications

## 2017-04-15 NOTE — Anesthesia Procedure Notes (Signed)
Anesthesia Regional Block: Interscalene brachial plexus block   Pre-Anesthetic Checklist: ,, timeout performed, Correct Patient, Correct Site, Correct Laterality, Correct Procedure, Correct Position, site marked, Risks and benefits discussed,  Surgical consent,  Pre-op evaluation,  At surgeon's request and post-op pain management  Laterality: Right  Prep: chloraprep       Needles:  Injection technique: Single-shot  Needle Type: Echogenic Stimulator Needle     Needle Length: 5cm  Needle Gauge: 22     Additional Needles:   Narrative:  Start time: 04/15/2017 1:02 PM End time: 04/15/2017 1:12 PM Injection made incrementally with aspirations every 5 mL.  Performed by: Personally  Anesthesiologist: Duane Boston, MD  Additional Notes: Functioning IV was confirmed and monitors applied.  A 15mm 22ga echogenic arrow stimulator was used. Sterile prep and drape,hand hygiene and sterile gloves were used.Ultrasound guidance: relevant anatomy identified, needle position confirmed, local anesthetic spread visualized around nerve(s)., vascular puncture avoided.  Image printed for medical record.  Negative aspiration and negative test dose prior to incremental administration of local anesthetic. The patient tolerated the procedure well.

## 2017-04-15 NOTE — Anesthesia Procedure Notes (Signed)
Procedure Name: Intubation Date/Time: 04/15/2017 2:43 PM Performed by: Kyung Rudd, CRNA Pre-anesthesia Checklist: Patient identified, Emergency Drugs available, Suction available and Patient being monitored Patient Re-evaluated:Patient Re-evaluated prior to induction Oxygen Delivery Method: Circle system utilized Preoxygenation: Pre-oxygenation with 100% oxygen Induction Type: IV induction Ventilation: Mask ventilation without difficulty Laryngoscope Size: Mac and 4 Grade View: Grade III Tube type: Oral Tube size: 7.5 mm Number of attempts: 1 Airway Equipment and Method: Stylet Placement Confirmation: positive ETCO2 and breath sounds checked- equal and bilateral Secured at: 21 cm Tube secured with: Tape Dental Injury: Teeth and Oropharynx as per pre-operative assessment

## 2017-04-16 LAB — BASIC METABOLIC PANEL
Anion gap: 9 (ref 5–15)
BUN: 11 mg/dL (ref 6–20)
CO2: 27 mmol/L (ref 22–32)
Calcium: 8.5 mg/dL — ABNORMAL LOW (ref 8.9–10.3)
Chloride: 102 mmol/L (ref 101–111)
Creatinine, Ser: 1.06 mg/dL (ref 0.61–1.24)
GFR calc Af Amer: >60 mL/min (ref >60)
GFR calc non Af Amer: >60 mL/min (ref >60)
Glucose, Bld: 156 mg/dL — ABNORMAL HIGH (ref 65–99)
Potassium: 3.9 mmol/L (ref 3.5–5.1)
Sodium: 138 mmol/L (ref 135–145)

## 2017-04-16 LAB — HEMOGLOBIN AND HEMATOCRIT, BLOOD
HCT: 38.6 % — ABNORMAL LOW (ref 39.0–52.0)
Hemoglobin: 12.1 g/dL — ABNORMAL LOW (ref 13.0–17.0)

## 2017-04-16 NOTE — Discharge Instructions (Signed)
Shoulder Joint Replacement, Care After This sheet gives you information about how to care for yourself after your procedure. Your health care provider may also give you more specific instructions. If you have problems or questions, contact your health care provider. What can I expect after the procedure? After your procedure, it is common to have:  A bruised and stiff shoulder.  A bruised and stiff arm.  Some pain.  Follow these instructions at home: If you have a sling:  Wear the sling as told by your health care provider. Remove it only as told by your health care provider.  Loosen the slingif your fingers tingle, become numb, or turn cold and blue.  Keep the sling clean.  If the sling is not waterproof: ? Do not let it get wet. ? Cover it with a watertight covering when you take a bath or a shower. Bathing  Do not take baths, swim, or use a hot tub until your health care provider approves. Ask your health care provider if you can take showers. You may only be allowed to take sponge baths for bathing.  If your sling is not waterproof, cover it with a watertight covering when you take a bath or a shower.  Keep the bandage (dressing) dry until your health care provider says it can be removed. Managing pain, stiffness, and swelling  If directed, put ice on the affected area. ? If you have a removable sling, remove it as told by your health care provider. ? Put ice in a plastic bag. ? Place a towel between your skin and the bag. ? Leave the ice on for 20 minutes, 2-3 times a day.  Move your fingers often to avoid stiffness and to lessen swelling. Driving  Do not drive or use heavy machinery while taking prescription pain medicine.  Do not drive for 2-4 weeks after surgery or as told by your health care provider. Medicine  Take over-the-counter and prescription medicines only as told by your health care provider.  If you were prescribed an antibiotic medicine, use it  as told by your health care provider. Do not stop using the antibiotic even if you start to feel better. Incision care  Follow instructions from your health care provider about how to take care of your incision area. Make sure you: ? Wash your hands with soap and water before you change your bandage (dressing). If soap and water are not available, use hand sanitizer. ? Change your dressing as told by your health care provider. ? Leave staples, stitches (sutures), skin glue, or adhesive strips in place. These skin closures may need to stay in place for 2 weeks or longer. If adhesive strip edges start to loosen and curl up, you may trim the loose edges. Do not remove adhesive strips completely unless your health care provider tells you to do that.  If you have a tube to remove drainage, follow instructions from your health care provider about caring for it. Do not remove the drain tube or any dressings around the tube opening unless your health care provider approves.  Check your incision area every day for signs of infection. Check for: ? More redness, swelling, or pain. ? More fluid or blood. ? Warmth. ? Pus or a bad smell. Activity  Do not use your arm to push yourself up in bed or from a chair. This requires too much muscle.  Follow lifting restrictions as told: ? Do not lift anything that is heavier than a  cup of coffee for the first 6 weeks after surgery, or as told by your health care provider. ? Do not lift anything that is heavier than 10 lb (4.5 kg) for 6 months, or as told by your health care provider.  Do exercises, including physical therapy, as told by your health care provider.  Try not to overuse your shoulder. This includes repetitive pushing or pulling. Early overuse of the shoulder may result in later problems. (Overusing the shoulder is easy to do when your pain goes away for the first time.)  Avoid overstretching your arm for 6 weeks after surgery, or as told by your  health care provider.  Avoid sitting for a long time without moving. Get up and move around one or more times every few hours.  Ask for help with some activities. Your health care provider may be able to suggest a clinic or agency for this if you do not have home support.  Do not participate in contact sports. General instructions  Keep all follow-up visits as told by your health care provider. This is important.  Do not use any products that contain nicotine or tobacco, such as cigarettes and e-cigarettes. These can delay healing. If you need help quitting, ask your health care provider. Contact a health care provider if:  You develop a rash.  You have a fever.  You have more redness, swelling, or pain in the incision area.  You have more fluid or blood coming from your incision area.  You have more pain when moving your shoulder. Get help right away if:  Your incision area feels warm to the touch.  You have pus or a bad smell coming from your incision area.  The edges of the incision site break open after sutures have been removed.  You have chest pain or shortness of breath. Summary  It is common to have pain and stiffness in your shoulder and arm after the procedure. Put ice on the affected area and take pain medicine as told by your health care provider.  Do not use your arm to push yourself up in bed or from a chair.  Do exercises, including physical therapy, as told by your health care provider.  Check your incision area daily. Call your health care provider if you see signs of infection. This information is not intended to replace advice given to you by your health care provider. Make sure you discuss any questions you have with your health care provider. Document Released: 11/20/2004 Document Revised: 02/16/2016 Document Reviewed: 02/16/2016 Elsevier Interactive Patient Education  2018 [place] to the shoulder as much as possible.  Keep the sling on  while up and around.  May remove while seated only and keep a pillow behind the elbow to keep the arm across your waist.  Do exercises four times per day to prevent stiffness.  Pendulums Lap slides Rotation(hand to abdomen and then out to straight in front,  Do not move the hand outward.  No push pull or lift.  Follow up in two weeks in the office [phone]

## 2017-04-16 NOTE — Evaluation (Signed)
Occupational Therapy Evaluation and Discharge Patient Details Name: Brett [name] MRN: [id number] DOB: [birth year] Today's Date: 04/16/2017    History of Present Illness Pt is s/p R total shoulder arthroplasty.  has a past medical history of Allergy, Arthritis, Cataract, GERD (gastroesophageal reflux disease), History of hiatal hernia, Hyperlipidemia, Pneumonia, PONV (postoperative nausea and vomiting), and Sleep apnea.   Clinical Impression   PTA Pt independent in ADL and mobility. Pt is currently mod A for ADL as his right hand is his dominant hand, limited by pain and precautions post-op. Pt provided with OT shoulder DC handout and reviewed in full as well as hand/wrist elbow exercise sheet and pendulum/lap side exercise sheet. All of these were explained in full with specific care taken to educate the Pt in NO EXTERNAL ROTATION, forward flexion OK to 90 and gentle ADL  Ok. Pt and wife verbalized understanding and OT and wife reviewed don/doff of sling. Pt and wife had no questions at the end of the session. Education complete, and OT to defer further therapy to MD after follow up. Thank you for the opportunity to serve this patient.     Follow Up Recommendations  DC plan and follow up therapy as arranged by surgeon    Equipment Recommendations  None recommended by OT    Recommendations for Other Services       Precautions / Restrictions Precautions Precautions: Shoulder Type of Shoulder Precautions: conservative protocol Shoulder Interventions: Shoulder sling/immobilizer;At all times;Off for dressing/bathing/exercises Precaution Booklet Issued: Yes (comment) Precaution Comments: Shoulder DC handout provided and reviewed in full; NO EXTERNAL ROTATION Required Braces or Orthoses: Sling Restrictions Weight Bearing Restrictions: Yes RUE Weight Bearing: Non weight bearing      Mobility Bed Mobility               General bed mobility comments: Pt sitting OOB in recliner when  OT entered  Transfers Overall transfer level: Modified independent Equipment used: None             General transfer comment: no assist needed    Balance Overall balance assessment: No apparent balance deficits (not formally assessed)                                         ADL either performed or assessed with clinical judgement   ADL Overall ADL's : Needs assistance/impaired                         Toilet Transfer: Modified Independent;Ambulation           Functional mobility during ADLs: Modified independent General ADL Comments: Please see shoulder section below for more details     Vision Baseline Vision/History: Wears glasses Wears Glasses: At all times Patient Visual Report: No change from baseline       Perception     Praxis      Pertinent Vitals/Pain Pain Assessment: 0-10 Pain Score: 3  Pain Location: surgical site Pain Descriptors / Indicators: Discomfort;Heaviness(block still partially in place) Pain Intervention(s): Monitored during session;Repositioned;Ice applied     Hand Dominance Right   Extremity/Trunk Assessment Upper Extremity Assessment Upper Extremity Assessment: RUE deficits/detail RUE Deficits / Details: post op deficits in ROM and strength as expected RUE: Unable to fully assess due to immobilization;Unable to fully assess due to pain RUE Coordination: decreased fine motor;decreased gross motor(block still partially in)  Lower Extremity Assessment Lower Extremity Assessment: Overall WFL for tasks assessed   Cervical / Trunk Assessment Cervical / Trunk Assessment: Normal   Communication Communication Communication: No difficulties   Cognition Arousal/Alertness: Awake/alert Behavior During Therapy: WFL for tasks assessed/performed Overall Cognitive Status: Within Functional Limits for tasks assessed                                     General Comments       Exercises  Exercises: Shoulder Shoulder Exercises Pendulum Exercise: PROM;Right;10 reps;Seated(gentle!!!!) Shoulder Flexion: PROM;Left;Supine(to 90) Shoulder External Rotation: (NO!) Elbow Flexion: PROM;Right;10 reps;Seated Elbow Extension: PROM;Right;10 reps;Seated Wrist Flexion: AROM;Right Wrist Extension: AROM;Right Digit Composite Flexion: AROM;Right Composite Extension: AROM;Right Neck Flexion: AROM Neck Extension: AROM Neck Lateral Flexion - Right: AROM Neck Lateral Flexion - Left: AROM   Shoulder Instructions Shoulder Instructions Donning/doffing shirt without moving shoulder: Moderate assistance;Caregiver independent with task;Patient able to independently direct caregiver Method for sponge bathing under operated UE: Minimal assistance Donning/doffing sling/immobilizer: Moderate assistance;Caregiver independent with task;Patient able to independently direct caregiver Correct positioning of sling/immobilizer: Moderate assistance;Caregiver independent with task;Patient able to independently direct caregiver Pendulum exercises (written home exercise program): Modified independent(provided with handout and educated GENTLE - no ER) ROM for elbow, wrist and digits of operated UE: Modified independent(reviewed in full with Pt and provided with ex sheet) Sling wearing schedule (on at all times/off for ADL's): Modified independent Proper positioning of operated UE when showering: Modified independent Positioning of UE while sleeping: Modified independent    Home Living Family/patient expects to be discharged to:: Private residence Living Arrangements: Spouse/significant other Available Help at Discharge: Family;Available 24 hours/day Type of Home: House Home Access: Stairs to enter [employer] of Steps: 3 Entrance Stairs-Rails: Left Home Layout: One level     Bathroom Shower/Tub: Producer, television/film/video: Handicapped height Bathroom Accessibility: Yes How Accessible:  Accessible via wheelchair Home Equipment: Shower seat - built in;Grab bars - toilet;Grab bars - tub/shower;Hand held shower head   Additional Comments: entire house is ADA and wc accessible - son is Technical sales engineer and ensured access      Prior Functioning/Environment Level of Independence: Independent        Comments: operates heavy machinery (farm) drives, former Art gallery manager, very independent        OT Problem List: Decreased strength;Decreased range of motion;Decreased activity tolerance;Decreased knowledge of precautions;Pain;Impaired UE functional use      OT Treatment/Interventions:      OT Goals(Current goals can be found in the care plan section) Acute Rehab OT Goals Patient Stated Goal: to get home OT Goal Formulation: With patient Time For Goal Achievement: 04/30/17 Potential to Achieve Goals: Good  OT Frequency:     Barriers to D/C:            Co-evaluation              AM-PAC PT "6 Clicks" Daily Activity     Outcome Measure Help from another person eating meals?: A Little Help from another person taking care of personal grooming?: A Little Help from another person toileting, which includes using toliet, bedpan, or urinal?: A Little Help from another person bathing (including washing, rinsing, drying)?: A Little Help from another person to put on and taking off regular upper body clothing?: A Lot Help from another person to put on and taking off regular lower body clothing?: A Lot 6 Click Score: 16  End of Session Equipment Utilized During Treatment: Other (comment)(sling) Nurse Communication: Mobility status  Activity Tolerance: Patient tolerated treatment well Patient left: in chair;with call bell/phone within reach;with family/visitor present  OT Visit Diagnosis: Pain Pain - Right/Left: Right Pain - part of body: Shoulder                Time: 4132-4401 OT Time Calculation (min): 26 min Charges:  OT General Charges $OT Visit: 1 Visit OT  Evaluation $OT Eval Moderate Complexity: 1 Mod OT Treatments $Self Care/Home Management : 8-22 mins G-Codes:     Sherryl Manges OTR/L [phone]  Evern Bio Derrall Hicks 04/16/2017, 3:40 PM

## 2017-04-16 NOTE — Progress Notes (Addendum)
Patient ID: Brett [name], male   DOB: 1946/03/20, 71 y.o.   MRN: [id number] Subjective: 1 Day Post-Op Procedure(s) (LRB): RIGHT TOTAL SHOULDER ARTHROPLASTY (Right)     Patient reports pain as mild.  Doing well, no events  Objective:   VITALS:   Vitals:   04/16/17 0009 04/16/17 0400  BP: 131/60 131/75  Pulse: 95 92  Resp: 20 18  Temp: 99.2 F (37.3 C) 98.9 F (37.2 C)  SpO2: 94% 95%    Neurovascular intact Incision: dressing C/D/I  LABS No results for input(s): HGB, HCT, WBC, PLT in the last 72 hours.  No results for input(s): NA, K, BUN, CREATININE, GLUCOSE in the last 72 hours.  No results for input(s): LABPT, INR in the last 72 hours.   Assessment/Plan: 1 Day Post-Op Procedure(s) (LRB): RIGHT TOTAL SHOULDER ARTHROPLASTY (Right)   Up with therapy  Plan for D/C today Dressing change to aquacell prior to D/C RTC in 2 weeks  I stressed the importance of not externally rotating to the patient due to a difficult subscap repair.  No forward elevation above 90 degrees.  NWB on right.  Will wear sling unless seated and supported.

## 2017-04-16 NOTE — Discharge Summary (Signed)
Physician Discharge Summary    Patient ID: Brett [name] MRN: [id number] DOB/AGE: 06/26/1945 71 y.o.  Admit date: 04/15/2017 Discharge date:  04/16/17  Procedures:  Procedure(s) (LRB): RIGHT TOTAL SHOULDER ARTHROPLASTY (Right)  Attending Physician:  Dr. Malon Kindle  Admission Diagnoses:   Right shoulder OA, end stage, primary  Consultants:  OT  Discharge Diagnoses:  Active Problems:   S/P shoulder replacement, right  Past Medical History:  Diagnosis Date  . Allergy   . Arthritis   . Cataract   . GERD (gastroesophageal reflux disease)   . History of hiatal hernia   . Hyperlipidemia   . Pneumonia    hx. of  . PONV (postoperative nausea and vomiting)    with gallbladder surgery  . Sleep apnea      PCP: Salley Scarlet, MD   Discharged Condition: good  Hospital Course:  Patient underwent the above stated procedure on 04/15/2017. Patient tolerated the procedure well and brought to the recovery room in good condition and subsequently to the floor. No complications during their hospital stay. No complicating wound issues during the hospital stay. Incision healing well and good early range of motion  Disposition: 01-Home or Self Care with follow up in 2 weeks  Medications: Current Facility-Administered Medications  Medication Dose Route Frequency Provider Last Rate Last Dose  . 0.9 %  sodium chloride infusion   Intravenous Continuous Beverely Low, MD      . acetaminophen (TYLENOL) tablet 650 mg  650 mg Oral Q4H PRN Beverely Low, MD       Or  . acetaminophen (TYLENOL) suppository 650 mg  650 mg Rectal Q4H PRN Beverely Low, MD      . aspirin chewable tablet 81 mg  81 mg Oral BID Beverely Low, MD   81 mg at 04/16/17 4259  . bisacodyl (DULCOLAX) suppository 10 mg  10 mg Rectal Daily PRN Beverely Low, MD      . colestipol (COLESTID) tablet 1 g  1 g Oral BID Beverely Low, MD   1 g at 04/16/17 5638  . diclofenac (VOLTAREN) EC tablet 75 mg  75 mg Oral BID Beverely Low, MD   75 mg at 04/16/17 7564  . docusate sodium (COLACE) capsule 100 mg  100 mg Oral BID Beverely Low, MD   100 mg at 04/16/17 3329  . menthol-cetylpyridinium (CEPACOL) lozenge 3 mg  1 lozenge Oral PRN Beverely Low, MD       Or  . phenol (CHLORASEPTIC) mouth spray 1 spray  1 spray Mouth/Throat PRN Beverely Low, MD      . methocarbamol (ROBAXIN) tablet 500 mg  500 mg Oral Q6H PRN Beverely Low, MD   500 mg at 04/16/17 5188   Or  . methocarbamol (ROBAXIN) 500 mg in dextrose 5 % 50 mL IVPB  500 mg Intravenous Q6H PRN Beverely Low, MD      . metoCLOPramide (REGLAN) tablet 5-10 mg  5-10 mg Oral Q8H PRN Beverely Low, MD       Or  . metoCLOPramide (REGLAN) injection 5-10 mg  5-10 mg Intravenous Q8H PRN Beverely Low, MD      . ondansetron [facility]) tablet 4 mg  4 mg Oral Q6H PRN Beverely Low, MD       Or  . ondansetron [facility]) injection 4 mg  4 mg Intravenous Q6H PRN Beverely Low, MD      . oxyCODONE (Oxy IR/ROXICODONE) immediate release tablet 5 mg  5 mg Oral Q3H PRN Beverely Low, MD  5 mg at 04/15/17 2143  . pantoprazole (PROTONIX) EC tablet 40 mg  40 mg Oral Daily Beverely Low, MD   40 mg at 04/16/17 0904  . polyethylene glycol (MIRALAX / GLYCOLAX) packet 17 g  17 g Oral Daily PRN Beverely Low, MD      . senna [facility]) tablet 17.2 mg  2 tablet Oral Wendi Maya, MD   17.2 mg at 04/15/17 2142  . tamsulosin (FLOMAX) capsule 0.4 mg  0.4 mg Oral Daily Beverely Low, MD   0.4 mg at 04/16/17 7829    Follow-up Information    Beverely Low, MD. Call in 2 week(s).   Specialty:  Orthopedic Surgery Why:  [phone] Contact information: [address] [place] [address] [phone]           Discharge Instructions    Call MD / Call 911   Complete by:  As directed    If you experience chest pain or shortness of breath, CALL 911 and be transported to the hospital emergency room.  If you develope a fever above 101 F, pus (white drainage) or increased  drainage or redness at the wound, or calf pain, call your surgeon's office.   Constipation Prevention   Complete by:  As directed    Drink plenty of fluids.  Prune juice may be helpful.  You may use a stool softener, such as Colace (over the counter) 100 mg twice a day.  Use MiraLax (over the counter) for constipation as needed.   Diet - low sodium heart healthy   Complete by:  As directed    Driving restrictions   Complete by:  As directed    No driving for 2 weeks   Increase activity slowly as tolerated   Complete by:  As directed       Allergies as of 04/16/2017      Reactions   Tramadol Nausea Only      Medication List    STOP taking these medications   HYDROcodone-acetaminophen 5-325 MG tablet Commonly known as:  NORCO/VICODIN   senna 8.6 MG Tabs tablet Commonly known as:  SENOKOT     TAKE these medications   acetaminophen 500 MG tablet Commonly known as:  TYLENOL Take 1,000 mg by mouth every 8 (eight) hours as needed for mild pain or moderate pain.   aspirin 81 MG chewable tablet Chew 1 tablet (81 mg total) by mouth 2 (two) times daily.   colestipol 1 g tablet Commonly known as:  COLESTID TAKE 1 TABLET TWICE A DAY   diclofenac 75 MG EC tablet Commonly known as:  VOLTAREN TAKE 1 TABLET TWICE A DAY   docusate sodium 100 MG capsule Commonly known as:  COLACE Take 1 capsule (100 mg total) by mouth 2 (two) times daily.   meclizine 25 MG tablet Commonly known as:  ANTIVERT Take 1 tablet (25 mg total) by mouth 2 (two) times daily as needed.   methocarbamol 500 MG tablet Commonly known as:  ROBAXIN Take 1 tablet (500 mg total) by mouth every 6 (six) hours as needed for muscle spasms. What changed:  Another medication with the same name was added. Make sure you understand how and when to take each.   methocarbamol 500 MG tablet Commonly known as:  ROBAXIN Take 1 tablet (500 mg total) by mouth 3 (three) times daily as needed. What changed:  You were already  taking a medication with the same name, and this prescription was added. Make sure  you understand how and when to take each.   omeprazole 20 MG capsule Commonly known as:  PRILOSEC TAKE 1 CAPSULE DAILY   ondansetron 4 MG tablet Commonly known as:  ZOFRAN Take 1 tablet (4 mg total) by mouth every 6 (six) hours as needed for nausea. What changed:  Another medication with the same name was added. Make sure you understand how and when to take each.   ondansetron 4 MG tablet Commonly known as:  ZOFRAN Take 1 tablet (4 mg total) by mouth every 8 (eight) hours as needed for nausea or vomiting. What changed:  You were already taking a medication with the same name, and this prescription was added. Make sure you understand how and when to take each.   oxyCODONE-acetaminophen 7.5-325 MG tablet Commonly known as:  PERCOCET Take 1 tablet by mouth every 4 (four) hours as needed for moderate pain or severe pain.   tamsulosin 0.4 MG Caps capsule Commonly known as:  FLOMAX TAKE 1 CAPSULE DAILY       Signed: Bobby Ragan,STEVEN R 04/16/2017, 9:41 AM

## 2017-04-16 NOTE — Progress Notes (Signed)
Patient alert and oriented, mae's well, voiding adequate amount of urine, swallowing without difficulty, no c/o pain at time of discharge. Patient discharged home with family. Script and discharged instructions given to patient. Patient and family stated understanding of instructions given. Patient has an appointment with Dr. Norris 

## 2017-04-17 ENCOUNTER — Encounter (HOSPITAL_COMMUNITY): Payer: Self-pay | Admitting: Orthopedic Surgery

## 2017-04-18 NOTE — Op Note (Signed)
NAME:  Brett [name], Brett [name] NO.:  MEDICAL RECORD NO.:  [id number]  LOCATION:                                 FACILITY:  PHYSICIAN:  Doran Heater. Veverly Fells, M.D.      DATE OF BIRTH:  DATE OF PROCEDURE:  04/15/2017 DATE OF DISCHARGE:                              OPERATIVE REPORT   PREOPERATIVE DIAGNOSIS:  Right shoulder end-stage osteoarthritis.  POSTOPERATIVE DIAGNOSIS:  Right shoulder end-stage osteoarthritis.  PROCEDURE PERFORMED:  Right total shoulder arthroplasty using a DePuy Global Unite System.  ATTENDING SURGEON:  Doran Heater. Veverly Fells, MD.  ASSISTANT:  Ventura Bruns, [facility], who was scrubbed during the entire procedure and necessary for satisfactory completion of surgery.  ANESTHESIA:  General anesthesia was used plus interscalene block.  ESTIMATED BLOOD LOSS:  250 mL.  FLUID REPLACEMENT:  1500 mL crystalloid.  INSTRUMENT COUNTS:  Correct.  COMPLICATIONS:  There were no complications.  PROPHYLAXIS:  Perioperative antibiotics were given.  INDICATIONS:  The patient is a 71 year old male with worsening right shoulder pain and dysfunction secondary to end-stage osteoarthritis. The patient is felt to have a competent rotator cuff, although his subscapularis was definitely attenuated superiorly and partially torn. Based on the patient's active lifestyle and desire to return to physical use with that right shoulder, we discussed options for both total standard anatomic shoulder replacement versus a reverse shoulder replacement.  The patient upon researching and discussing the options, really want to move forward with the anatomic and did not want to do the reverse unless it was absolutely the only option.  Risks and benefits of surgical management were discussed in detail with the patient.  Informed consent obtained.  DESCRIPTION OF PROCEDURE:  After an adequate level of anesthesia achieved, the patient was positioned in the modified beach-chair position.   Right shoulder correctly identified and sterilely prepped and draped in usual manner.  Time-out called.  The patient's motion was very poor including forward elevation maybe 70 degrees.  Abduction was approximately 20-30, external rotation was about 40, internal rotation was no more than 10 or 20 with the arm abducted.  Significant crepitus noted with passive range of motion.  Following our sterile prep and drape and our exam under anesthesia, re-verification of time-out, we entered the shoulder using a standard deltopectoral incision starting at the coracoid process, extending down to the anterior humerus. Dissection down through subcutaneous tissues using the Bovie.  We identified the cephalic vein, took it laterally with the deltoid, pectoralis taken medially.  Conjoint tendon identified and retracted medially.  We identified the biceps tendon and tenodesed that in situ with 0 Vicryl figure-of-eight suture x2.  We then identified the subscapularis, again releasing that we could tell the superior portion was extremely thin and really the full thickness of the tendon was just at the midpoint and inferiorly, but we did feel that the subscap was amenable to repair, so we released that very carefully to maintain all of her length with a subperiosteal peel.  We then placed #2 Hi-Fi suture in a modified Mason-Allen suture technique into the tendon to repair that.  We retracted it.  We mobilized it.  There were some large loose bodies encountered.  The joint fluid was normal in appearance, but quite a bit of it with a large effusion, but we were able to retrieve multiple large loose bodies including 1 in the subscap recess.  We freed the subscap off the glenoid face and off the undersurface of the coracoid. We then externally rotated approximately 30 degrees, placed our T- handled Crego over the top of the humeral head and into the biceps tendon and then another large retracted medially to  protect the medial soft tissue structures and then we placed our neck resection guide and resected for the DePuy Global Unite stem and head.  Once we did that with the oscillating saw, we removed the head to the back table.  We removed excess osteophytes with a rongeur all the way posteriorly.  We had good exposure on the humeral side.  We then subluxed the humerus posteriorly.  This was quite difficult in this muscular individual, but we were able to get it subluxed enough to see well for the glenoid.  We did a 360-degree capsule labral removal, carefully protected the axillary nerve.  We released the biceps, tenotomized that and again had been tenodesed previously, but we removed it from the intraarticular portion of shoulder.  Once we had good 360-degree exposure of the glenoid face, we drilled our central guidepin.  We sized the glenoid to a 48 anchor peg glenoid.  We drilled our central guidepin.  We then reamed for the 48 APG glenoid component.  We then did our peripheral hand reaming, drilled our central peg hole, our 3 peripheral holes, placed Gel-Foam soaked with thrombin in the 3 peripheral holes to dry and vacuum mixed cement on the back table and cemented just 3 peripheral peg holes.  The APG central peg was noncemented.  We impacted that in position, held until the cement was hardened.  We then tested and it was stable.  We then went to the humeral side, difficult to exposure just being able to get in line with the axis of the humeral shaft, but we did ream up to a size 12 distally.  When preparing proximally, we could not get the proximal broach 12 broach in, but the 10 broach fit well.  We were worried about fracturing the proximal metaphyseal portion, so we did select a hybrid with a 12 body and a 10 metaphysis slight mismatch, but this fit better proximally, so we impacted that in position, and we were able to gain good bony coverage with a 44, initially 18.  Thought we  get a little bit better superiorly with a 21, so we did eventually selected a 21 trial, reduced the shoulder.  We were able to translate 50% posteriorly and inferiorly.  We then at this point, irrigated thoroughly, removed the trial components, placed drill holes in the lesser tuberosity, #2 FiberWire suture in the lesser tuberosity for repair of the subscap tendon, and then using impaction grafting technique and available bone graft from the head, we placed the press- fit 12 body, 10 metaphysis at 30 degrees of retroversion into the humerus, flushed with the osteotomy and then, we were able to select the real 48 x 21 eccentric dialed superiorly and posteriorly and impacted that in position.  We had good coverage, nice stable shoulder, reduced the shoulder, irrigated thoroughly and anatomically repaired the subscapularis back to the lesser tuberosity as well as incorporating into the rotator interval with 3 rotator interval sutures.  The patient's external  rotation was to 0 only.  We could not get any beyond that because of just loss of overall length of the subscapularis. Again, we were pushing it for the shoulder based on what we had left for the subscap, but we did feel it was worth the attempted an anatomic shoulder replacement for the patient's desire to return to a very physical lifestyle.  Once we had everything together, I was able to range the shoulder again 0 with external rotation, the nice and smooth internal rotation of the belly, and then forward elevation to about 120 with no real significant tension there, so irrigation and closure of 2-0 Vicryl for subcu, 4-0 Monocryl for skin, Steri-Strips applied, followed by sterile dressing and a shoulder sling.  The patient tolerated the surgery well.     Doran Heater. Veverly Fells, M.D.     SRN/MEDQ  D:  04/15/2017  T:  04/16/2017  Job:  [id number]

## 2017-04-19 ENCOUNTER — Telehealth: Payer: Self-pay | Admitting: *Deleted

## 2017-04-19 MED ORDER — ZOLPIDEM TARTRATE 10 MG PO TABS: 10.0000 mg | ORAL_TABLET | Freq: Every evening | ORAL | 1 refills | Status: DC | PRN

## 2017-04-19 NOTE — Telephone Encounter (Signed)
Medication called to pharmacy. 

## 2017-04-19 NOTE — Telephone Encounter (Signed)
Received call from patient wife, Vaughan Basta.   Requested refill on Ambien.   Ok to refill?

## 2017-04-19 NOTE — Telephone Encounter (Signed)
okay

## 2017-05-03 DIAGNOSIS — Z471 Aftercare following joint replacement surgery: Secondary | ICD-10-CM | POA: Diagnosis not present

## 2017-05-03 DIAGNOSIS — Z96611 Presence of right artificial shoulder joint: Secondary | ICD-10-CM | POA: Diagnosis not present

## 2017-05-04 ENCOUNTER — Encounter: Payer: Self-pay | Admitting: Family Medicine

## 2017-05-04 ENCOUNTER — Ambulatory Visit (INDEPENDENT_AMBULATORY_CARE_PROVIDER_SITE_OTHER): Payer: Medicare HMO | Admitting: Family Medicine

## 2017-05-04 ENCOUNTER — Other Ambulatory Visit: Payer: Self-pay

## 2017-05-04 VITALS — BP 134/80 | HR 70 | Temp 97.9°F | Resp 14 | Ht 68.0 in | Wt 186.0 lb

## 2017-05-04 DIAGNOSIS — E781 Pure hyperglyceridemia: Secondary | ICD-10-CM

## 2017-05-04 DIAGNOSIS — I10 Essential (primary) hypertension: Secondary | ICD-10-CM | POA: Diagnosis not present

## 2017-05-04 DIAGNOSIS — R7302 Impaired glucose tolerance (oral): Secondary | ICD-10-CM | POA: Diagnosis not present

## 2017-05-04 DIAGNOSIS — Z23 Encounter for immunization: Secondary | ICD-10-CM | POA: Diagnosis not present

## 2017-05-04 NOTE — Patient Instructions (Signed)
F/U 6 months for Physical  

## 2017-05-04 NOTE — Progress Notes (Signed)
Subjective:    Patient ID: Brett [name], male    DOB: March 06, 1946, 71 y.o.   MRN: [id number]  Patient presents for Follow-up (is fasting)  Patient here to follow-up chronic medical problems.  Since our last visit he has had his right hip surgery as well as his right shoulder replaced which was done on November 30.  He is currently on diclofenac Reviewed his hospital labs at discharge hemoglobin was 12.1 renal function was normal. Medications reviewed  Hyperlipidemia he is taking Colestipal -due for fasting lipids today. He does have history of glucose intolerance his last A1c was 5.6% one year ago  Chronic insomnia Ambien without any difficulties   Review Of Systems:  GEN- denies fatigue, fever, weight loss,weakness, recent illness HEENT- denies eye drainage, change in vision, nasal discharge, CVS- denies chest pain, palpitations RESP- denies SOB, cough, wheeze ABD- denies N/V, change in stools, abd pain GU- denies dysuria, hematuria, dribbling, incontinence MSK- denies joint pain, muscle aches, injury Neuro- denies headache, dizziness, syncope, seizure activity       Objective:    BP 134/80   Pulse 70   Temp 97.9 F (36.6 C) (Oral)   Resp 14   Ht 5\' 8"  (1.727 m)   Wt 186 lb (84.4 kg)   SpO2 96%   BMI 28.28 kg/m  GEN- NAD, alert and oriented x3 HEENT- PERRL, EOMI, non injected sclera, pink conjunctiva, MMM, oropharynx clear CVS- RRR, no murmur RESP-CTAB ABD-NABS,soft,NT,ND EXT- No edema, right arm in sling Pulses- Radial,2+        Assessment & Plan:      Problem List Items Addressed This Visit      Unprioritized   Glucose intolerance (impaired glucose tolerance)   Relevant Orders   Hemoglobin A1c   Essential hypertension   Relevant Orders   CBC with Differential/Platelet   Comprehensive metabolic panel   Hypertriglyceridemia - Primary    Check lipids he is on Colestid will also check his liver function test. His blood pressure is controlled  without any medications.  In general he is doing quite well for his age and coming out of surgery. No sign of any postoperative anemia  Glucose intolerance we will recheck his A1c for the year.  Flu shot was given      Relevant Orders   Lipid panel    Other Visit Diagnoses    Need for influenza vaccination       Relevant Orders   Flu vaccine HIGH DOSE PF (Fluzone High dose) (Completed)      Note: This dictation was prepared with Dragon dictation along with smaller phrase technology. Any transcriptional errors that result from this process are unintentional.

## 2017-05-04 NOTE — Assessment & Plan Note (Signed)
Check lipids he is on Colestid will also check his liver function test. His blood pressure is controlled without any medications.  In general he is doing quite well for his age and coming out of surgery. No sign of any postoperative anemia  Glucose intolerance we will recheck his A1c for the year.  Flu shot was given

## 2017-05-05 DIAGNOSIS — Z96611 Presence of right artificial shoulder joint: Secondary | ICD-10-CM | POA: Diagnosis not present

## 2017-05-05 DIAGNOSIS — M25511 Pain in right shoulder: Secondary | ICD-10-CM | POA: Diagnosis not present

## 2017-05-05 DIAGNOSIS — M25611 Stiffness of right shoulder, not elsewhere classified: Secondary | ICD-10-CM | POA: Diagnosis not present

## 2017-05-05 DIAGNOSIS — G4733 Obstructive sleep apnea (adult) (pediatric): Secondary | ICD-10-CM | POA: Diagnosis not present

## 2017-05-05 LAB — CBC WITH DIFFERENTIAL/PLATELET
Basophils Absolute: 119 cells/uL (ref 0–200)
Basophils Relative: 1.5 %
Eosinophils Absolute: 514 cells/uL — ABNORMAL HIGH (ref 15–500)
Eosinophils Relative: 6.5 %
HCT: 39.7 % (ref 38.5–50.0)
Hemoglobin: 12.9 g/dL — ABNORMAL LOW (ref 13.2–17.1)
Lymphs Abs: 1778 cells/uL (ref 850–3900)
MCH: 26.2 pg — ABNORMAL LOW (ref 27.0–33.0)
MCHC: 32.5 g/dL (ref 32.0–36.0)
MCV: 80.5 fL (ref 80.0–100.0)
MPV: 9 fL (ref 7.5–12.5)
Monocytes Relative: 7.3 %
Neutro Abs: 4914 cells/uL (ref 1500–7800)
Neutrophils Relative %: 62.2 %
Platelets: 402 10*3/uL — ABNORMAL HIGH (ref 140–400)
RBC: 4.93 10*6/uL (ref 4.20–5.80)
RDW: 13.4 % (ref 11.0–15.0)
Total Lymphocyte: 22.5 %
WBC mixed population: 577 cells/uL (ref 200–950)
WBC: 7.9 10*3/uL (ref 3.8–10.8)

## 2017-05-05 LAB — COMPREHENSIVE METABOLIC PANEL
AG Ratio: 1.7 (calc) (ref 1.0–2.5)
ALT: 14 U/L (ref 9–46)
AST: 13 U/L (ref 10–35)
Albumin: 4 g/dL (ref 3.6–5.1)
Alkaline phosphatase (APISO): 75 U/L (ref 40–115)
BUN: 15 mg/dL (ref 7–25)
CO2: 29 mmol/L (ref 20–32)
Calcium: 9.3 mg/dL (ref 8.6–10.3)
Chloride: 103 mmol/L (ref 98–110)
Creat: 0.89 mg/dL (ref 0.70–1.18)
Globulin: 2.3 g/dL (calc) (ref 1.9–3.7)
Glucose, Bld: 105 mg/dL — ABNORMAL HIGH (ref 65–99)
Potassium: 4.5 mmol/L (ref 3.5–5.3)
Sodium: 141 mmol/L (ref 135–146)
Total Bilirubin: 0.7 mg/dL (ref 0.2–1.2)
Total Protein: 6.3 g/dL (ref 6.1–8.1)

## 2017-05-05 LAB — LIPID PANEL
Cholesterol: 250 mg/dL — ABNORMAL HIGH (ref <200)
HDL: 38 mg/dL — ABNORMAL LOW (ref >40)
LDL Cholesterol (Calc): 174 mg/dL (calc) — ABNORMAL HIGH
Non-HDL Cholesterol (Calc): 212 mg/dL (calc) — ABNORMAL HIGH (ref <130)
Total CHOL/HDL Ratio: 6.6 (calc) — ABNORMAL HIGH (ref <5.0)
Triglycerides: 213 mg/dL — ABNORMAL HIGH (ref <150)

## 2017-05-05 LAB — HEMOGLOBIN A1C
Hgb A1c MFr Bld: 5.9 % of total Hgb — ABNORMAL HIGH (ref <5.7)
Mean Plasma Glucose: 123 (calc)
eAG (mmol/L): 6.8 (calc)

## 2017-05-06 ENCOUNTER — Other Ambulatory Visit: Payer: Self-pay | Admitting: *Deleted

## 2017-05-06 MED ORDER — FENOFIBRATE 48 MG PO TABS: 48.0000 mg | ORAL_TABLET | Freq: Every day | ORAL | 1 refills | Status: DC

## 2017-05-12 DIAGNOSIS — Z96611 Presence of right artificial shoulder joint: Secondary | ICD-10-CM | POA: Diagnosis not present

## 2017-05-12 DIAGNOSIS — M25511 Pain in right shoulder: Secondary | ICD-10-CM | POA: Diagnosis not present

## 2017-05-12 DIAGNOSIS — M25611 Stiffness of right shoulder, not elsewhere classified: Secondary | ICD-10-CM | POA: Diagnosis not present

## 2017-05-19 ENCOUNTER — Other Ambulatory Visit: Payer: Self-pay | Admitting: *Deleted

## 2017-05-19 DIAGNOSIS — Z96611 Presence of right artificial shoulder joint: Secondary | ICD-10-CM | POA: Diagnosis not present

## 2017-05-19 DIAGNOSIS — M25611 Stiffness of right shoulder, not elsewhere classified: Secondary | ICD-10-CM | POA: Diagnosis not present

## 2017-05-19 DIAGNOSIS — M25511 Pain in right shoulder: Secondary | ICD-10-CM | POA: Diagnosis not present

## 2017-05-19 MED ORDER — FENOFIBRATE 48 MG PO TABS: 48.0000 mg | ORAL_TABLET | Freq: Every day | ORAL | 3 refills | Status: DC

## 2017-05-24 ENCOUNTER — Other Ambulatory Visit: Payer: Self-pay | Admitting: Family Medicine

## 2017-05-26 DIAGNOSIS — M25611 Stiffness of right shoulder, not elsewhere classified: Secondary | ICD-10-CM | POA: Diagnosis not present

## 2017-05-26 DIAGNOSIS — Z96611 Presence of right artificial shoulder joint: Secondary | ICD-10-CM | POA: Diagnosis not present

## 2017-05-26 DIAGNOSIS — M25511 Pain in right shoulder: Secondary | ICD-10-CM | POA: Diagnosis not present

## 2017-05-31 DIAGNOSIS — M25511 Pain in right shoulder: Secondary | ICD-10-CM | POA: Diagnosis not present

## 2017-05-31 DIAGNOSIS — Z471 Aftercare following joint replacement surgery: Secondary | ICD-10-CM | POA: Diagnosis not present

## 2017-05-31 DIAGNOSIS — Z5189 Encounter for other specified aftercare: Secondary | ICD-10-CM | POA: Insufficient documentation

## 2017-05-31 DIAGNOSIS — Z96611 Presence of right artificial shoulder joint: Secondary | ICD-10-CM | POA: Diagnosis not present

## 2017-05-31 DIAGNOSIS — M25611 Stiffness of right shoulder, not elsewhere classified: Secondary | ICD-10-CM | POA: Diagnosis not present

## 2017-06-07 DIAGNOSIS — Z96611 Presence of right artificial shoulder joint: Secondary | ICD-10-CM | POA: Diagnosis not present

## 2017-06-07 DIAGNOSIS — M25511 Pain in right shoulder: Secondary | ICD-10-CM | POA: Diagnosis not present

## 2017-06-07 DIAGNOSIS — M25611 Stiffness of right shoulder, not elsewhere classified: Secondary | ICD-10-CM | POA: Diagnosis not present

## 2017-06-09 DIAGNOSIS — M25511 Pain in right shoulder: Secondary | ICD-10-CM | POA: Diagnosis not present

## 2017-06-09 DIAGNOSIS — M25611 Stiffness of right shoulder, not elsewhere classified: Secondary | ICD-10-CM | POA: Diagnosis not present

## 2017-06-09 DIAGNOSIS — Z96611 Presence of right artificial shoulder joint: Secondary | ICD-10-CM | POA: Diagnosis not present

## 2017-06-13 DIAGNOSIS — M25611 Stiffness of right shoulder, not elsewhere classified: Secondary | ICD-10-CM | POA: Diagnosis not present

## 2017-06-13 DIAGNOSIS — M25511 Pain in right shoulder: Secondary | ICD-10-CM | POA: Diagnosis not present

## 2017-06-13 DIAGNOSIS — Z96611 Presence of right artificial shoulder joint: Secondary | ICD-10-CM | POA: Diagnosis not present

## 2017-06-14 DIAGNOSIS — Z471 Aftercare following joint replacement surgery: Secondary | ICD-10-CM | POA: Diagnosis not present

## 2017-06-14 DIAGNOSIS — Z96641 Presence of right artificial hip joint: Secondary | ICD-10-CM | POA: Diagnosis not present

## 2017-06-16 DIAGNOSIS — M25611 Stiffness of right shoulder, not elsewhere classified: Secondary | ICD-10-CM | POA: Diagnosis not present

## 2017-06-16 DIAGNOSIS — Z96611 Presence of right artificial shoulder joint: Secondary | ICD-10-CM | POA: Diagnosis not present

## 2017-06-16 DIAGNOSIS — M25511 Pain in right shoulder: Secondary | ICD-10-CM | POA: Diagnosis not present

## 2017-06-20 DIAGNOSIS — M25611 Stiffness of right shoulder, not elsewhere classified: Secondary | ICD-10-CM | POA: Diagnosis not present

## 2017-06-20 DIAGNOSIS — M25511 Pain in right shoulder: Secondary | ICD-10-CM | POA: Diagnosis not present

## 2017-06-20 DIAGNOSIS — Z96611 Presence of right artificial shoulder joint: Secondary | ICD-10-CM | POA: Diagnosis not present

## 2017-06-23 DIAGNOSIS — Z96611 Presence of right artificial shoulder joint: Secondary | ICD-10-CM | POA: Diagnosis not present

## 2017-06-23 DIAGNOSIS — M25611 Stiffness of right shoulder, not elsewhere classified: Secondary | ICD-10-CM | POA: Diagnosis not present

## 2017-06-23 DIAGNOSIS — M25511 Pain in right shoulder: Secondary | ICD-10-CM | POA: Diagnosis not present

## 2017-06-27 DIAGNOSIS — M25511 Pain in right shoulder: Secondary | ICD-10-CM | POA: Diagnosis not present

## 2017-06-27 DIAGNOSIS — M25611 Stiffness of right shoulder, not elsewhere classified: Secondary | ICD-10-CM | POA: Diagnosis not present

## 2017-06-27 DIAGNOSIS — Z96611 Presence of right artificial shoulder joint: Secondary | ICD-10-CM | POA: Diagnosis not present

## 2017-07-12 DIAGNOSIS — M25511 Pain in right shoulder: Secondary | ICD-10-CM | POA: Diagnosis not present

## 2017-07-12 DIAGNOSIS — Z471 Aftercare following joint replacement surgery: Secondary | ICD-10-CM | POA: Diagnosis not present

## 2017-08-11 DIAGNOSIS — H34832 Tributary (branch) retinal vein occlusion, left eye, with macular edema: Secondary | ICD-10-CM | POA: Diagnosis not present

## 2017-09-08 DIAGNOSIS — H34832 Tributary (branch) retinal vein occlusion, left eye, with macular edema: Secondary | ICD-10-CM | POA: Diagnosis not present

## 2017-09-21 DIAGNOSIS — Z96641 Presence of right artificial hip joint: Secondary | ICD-10-CM | POA: Diagnosis not present

## 2017-09-21 DIAGNOSIS — M7061 Trochanteric bursitis, right hip: Secondary | ICD-10-CM | POA: Diagnosis not present

## 2017-09-29 DIAGNOSIS — H34832 Tributary (branch) retinal vein occlusion, left eye, with macular edema: Secondary | ICD-10-CM | POA: Diagnosis not present

## 2017-10-06 DIAGNOSIS — Z471 Aftercare following joint replacement surgery: Secondary | ICD-10-CM | POA: Diagnosis not present

## 2017-10-06 DIAGNOSIS — Z96611 Presence of right artificial shoulder joint: Secondary | ICD-10-CM | POA: Diagnosis not present

## 2017-10-06 DIAGNOSIS — M19011 Primary osteoarthritis, right shoulder: Secondary | ICD-10-CM | POA: Diagnosis not present

## 2017-10-16 ENCOUNTER — Other Ambulatory Visit: Payer: Self-pay | Admitting: Family Medicine

## 2017-10-27 DIAGNOSIS — H34832 Tributary (branch) retinal vein occlusion, left eye, with macular edema: Secondary | ICD-10-CM | POA: Diagnosis not present

## 2017-11-04 DIAGNOSIS — H25811 Combined forms of age-related cataract, right eye: Secondary | ICD-10-CM | POA: Diagnosis not present

## 2017-11-04 DIAGNOSIS — H25812 Combined forms of age-related cataract, left eye: Secondary | ICD-10-CM | POA: Diagnosis not present

## 2017-11-04 DIAGNOSIS — H43812 Vitreous degeneration, left eye: Secondary | ICD-10-CM | POA: Diagnosis not present

## 2017-11-04 DIAGNOSIS — H2512 Age-related nuclear cataract, left eye: Secondary | ICD-10-CM | POA: Diagnosis not present

## 2017-11-09 ENCOUNTER — Other Ambulatory Visit: Payer: Self-pay

## 2017-11-09 ENCOUNTER — Encounter: Payer: Self-pay | Admitting: Family Medicine

## 2017-11-09 ENCOUNTER — Ambulatory Visit (INDEPENDENT_AMBULATORY_CARE_PROVIDER_SITE_OTHER): Payer: Medicare HMO | Admitting: Family Medicine

## 2017-11-09 VITALS — BP 150/88 | HR 66 | Temp 98.7°F | Resp 14 | Ht 68.0 in | Wt 188.0 lb

## 2017-11-09 DIAGNOSIS — R7302 Impaired glucose tolerance (oral): Secondary | ICD-10-CM

## 2017-11-09 DIAGNOSIS — H9193 Unspecified hearing loss, bilateral: Secondary | ICD-10-CM | POA: Diagnosis not present

## 2017-11-09 DIAGNOSIS — J0100 Acute maxillary sinusitis, unspecified: Secondary | ICD-10-CM

## 2017-11-09 DIAGNOSIS — I10 Essential (primary) hypertension: Secondary | ICD-10-CM | POA: Diagnosis not present

## 2017-11-09 DIAGNOSIS — E781 Pure hyperglyceridemia: Secondary | ICD-10-CM | POA: Diagnosis not present

## 2017-11-09 MED ORDER — AMOXICILLIN 875 MG PO TABS: 875.0000 mg | ORAL_TABLET | Freq: Two times a day (BID) | ORAL | 0 refills | Status: DC

## 2017-11-09 NOTE — Patient Instructions (Signed)
Take antibiotics as prescribed Zyrtec  F/U 6 months Physical

## 2017-11-09 NOTE — Progress Notes (Signed)
Subjective:    Patient ID: Brett [name], male    DOB: 13-Jun-1945, 72 y.o.   MRN: [id number]  Patient presents for Follow-up (is fasting)  Pt here to f/u chronic medical problems. Medications reviewed  Borderline DM- last A1C 6 months ago, was  5.9%  Hypertriglyceridemia- he has been on chronic Cholestid, LDL last  172, recommended he try statin but he declined , he did decide to try Tricor   Taking 1 of colestepiol   Down to 1 of diclofenac    HTN- has had elevated BP on and off past year, he had declined medications , BP before surgery was 138/70's   [facility]- had cataract surgery, retinal occlusion  Dr. Genia Del, Dr Izell Belknap - Retina surgery  2nd cataract July 18th   Cough and sinus pressure drainage, left ear feels clogged, has used Sudafed, has some production , last Sudafed dose 2 days , no fever         Review Of Systems:  GEN- denies fatigue, fever, weight loss,weakness, recent illness HEENT- denies eye drainage, change in vision, +nasal discharge, CVS- denies chest pain, palpitations RESP- denies SOB,+ cough, wheeze ABD- denies N/V, change in stools, abd pain GU- denies dysuria, hematuria, dribbling, incontinence MSK- denies joint pain, muscle aches, injury Neuro- denies headache, dizziness, syncope, seizure activity       Objective:    BP (!) 150/88   Pulse 66   Temp 98.7 F (37.1 C) (Oral)   Resp 14   Ht 5\' 8"  (1.727 m)   Wt 188 lb (85.3 kg)   SpO2 98%   BMI 28.59 kg/m  GEN- NAD, alert and oriented x3 HEENT- PERRL, EOMI, non injected sclera, pink conjunctiva, MMM, oropharynx clear+ maxillary sinus tenderness L > R, nares enlarged turbinates, TM decreased light reflex, no erythema  Neck- Supple, no thyromegaly, shotty ant LAD CVS- RRR, no murmur RESP-CTAB EXT- No edema Pulses- Radial  2+        Assessment & Plan:      Problem List Items Addressed This Visit      Unprioritized   Essential hypertension - Primary    D/C  SUDAFED,check BP at home Declines BP meds      Relevant Orders   CBC with Differential/Platelet (Completed)   Comprehensive metabolic panel (Completed)   Glucose intolerance (impaired glucose tolerance)   Relevant Orders   Hemoglobin A1c (Completed)   Hypertriglyceridemia    Recheck fasting labs, Liver function Continue current meds      Relevant Orders   Lipid panel (Completed)    Other Visit Diagnoses    Bilateral hearing loss, unspecified hearing loss type       end of visit asked to be re-evalated had work related hearing loss in past   Relevant Orders   Ambulatory referral to ENT   Acute non-recurrent maxillary sinusitis       Treat oral anti-histamine, declines nasal sprays, amoxicillin   Relevant Medications   amoxicillin (AMOXIL) 875 MG tablet      Note: This dictation was prepared with Dragon dictation along with smaller phrase technology. Any transcriptional errors that result from this process are unintentional.

## 2017-11-10 ENCOUNTER — Encounter: Payer: Self-pay | Admitting: Family Medicine

## 2017-11-10 LAB — HEMOGLOBIN A1C
Hgb A1c MFr Bld: 5.6 % of total Hgb (ref <5.7)
Mean Plasma Glucose: 114 (calc)
eAG (mmol/L): 6.3 (calc)

## 2017-11-10 LAB — CBC WITH DIFFERENTIAL/PLATELET
Basophils Absolute: 79 cells/uL (ref 0–200)
Basophils Relative: 1.3 %
Eosinophils Absolute: 329 cells/uL (ref 15–500)
Eosinophils Relative: 5.4 %
HCT: 40.9 % (ref 38.5–50.0)
Hemoglobin: 13.5 g/dL (ref 13.2–17.1)
Lymphs Abs: 1531 cells/uL (ref 850–3900)
MCH: 26.4 pg — ABNORMAL LOW (ref 27.0–33.0)
MCHC: 33 g/dL (ref 32.0–36.0)
MCV: 80 fL (ref 80.0–100.0)
MPV: 9.6 fL (ref 7.5–12.5)
Monocytes Relative: 6.6 %
Neutro Abs: 3758 cells/uL (ref 1500–7800)
Neutrophils Relative %: 61.6 %
Platelets: 294 10*3/uL (ref 140–400)
RBC: 5.11 10*6/uL (ref 4.20–5.80)
RDW: 13.5 % (ref 11.0–15.0)
Total Lymphocyte: 25.1 %
WBC mixed population: 403 cells/uL (ref 200–950)
WBC: 6.1 10*3/uL (ref 3.8–10.8)

## 2017-11-10 LAB — COMPREHENSIVE METABOLIC PANEL
AG Ratio: 1.7 (calc) (ref 1.0–2.5)
ALT: 10 U/L (ref 9–46)
AST: 14 U/L (ref 10–35)
Albumin: 4.2 g/dL (ref 3.6–5.1)
Alkaline phosphatase (APISO): 45 U/L (ref 40–115)
BUN: 13 mg/dL (ref 7–25)
CO2: 24 mmol/L (ref 20–32)
Calcium: 9 mg/dL (ref 8.6–10.3)
Chloride: 106 mmol/L (ref 98–110)
Creat: 0.98 mg/dL (ref 0.70–1.18)
Globulin: 2.5 g/dL (calc) (ref 1.9–3.7)
Glucose, Bld: 103 mg/dL — ABNORMAL HIGH (ref 65–99)
Potassium: 4.3 mmol/L (ref 3.5–5.3)
Sodium: 138 mmol/L (ref 135–146)
Total Bilirubin: 0.6 mg/dL (ref 0.2–1.2)
Total Protein: 6.7 g/dL (ref 6.1–8.1)

## 2017-11-10 LAB — LIPID PANEL
Cholesterol: 208 mg/dL — ABNORMAL HIGH (ref <200)
HDL: 36 mg/dL — ABNORMAL LOW (ref >40)
LDL Cholesterol (Calc): 146 mg/dL (calc) — ABNORMAL HIGH
Non-HDL Cholesterol (Calc): 172 mg/dL (calc) — ABNORMAL HIGH (ref <130)
Total CHOL/HDL Ratio: 5.8 (calc) — ABNORMAL HIGH (ref <5.0)
Triglycerides: 135 mg/dL (ref <150)

## 2017-11-10 NOTE — Assessment & Plan Note (Signed)
Recheck fasting labs, Liver function Continue current meds

## 2017-11-10 NOTE — Assessment & Plan Note (Signed)
D/C SUDAFED,check BP at home Declines BP meds

## 2017-11-21 DIAGNOSIS — J310 Chronic rhinitis: Secondary | ICD-10-CM | POA: Diagnosis not present

## 2017-11-21 DIAGNOSIS — H6523 Chronic serous otitis media, bilateral: Secondary | ICD-10-CM | POA: Diagnosis not present

## 2017-11-24 DIAGNOSIS — H34832 Tributary (branch) retinal vein occlusion, left eye, with macular edema: Secondary | ICD-10-CM | POA: Diagnosis not present

## 2017-12-01 DIAGNOSIS — H25811 Combined forms of age-related cataract, right eye: Secondary | ICD-10-CM | POA: Diagnosis not present

## 2017-12-01 DIAGNOSIS — H2511 Age-related nuclear cataract, right eye: Secondary | ICD-10-CM | POA: Diagnosis not present

## 2017-12-05 ENCOUNTER — Other Ambulatory Visit: Payer: Self-pay

## 2017-12-05 ENCOUNTER — Encounter: Payer: Self-pay | Admitting: Family Medicine

## 2017-12-05 ENCOUNTER — Ambulatory Visit (INDEPENDENT_AMBULATORY_CARE_PROVIDER_SITE_OTHER): Payer: Medicare HMO | Admitting: Family Medicine

## 2017-12-05 ENCOUNTER — Ambulatory Visit
Admission: RE | Admit: 2017-12-05 | Discharge: 2017-12-05 | Disposition: A | Discharge status: Home / Self Care | Payer: Medicare HMO | Source: Ambulatory Visit | Attending: Family Medicine | Admitting: Family Medicine

## 2017-12-05 VITALS — BP 146/82 | HR 68 | Temp 98.2°F | Resp 14 | Ht 68.0 in | Wt 188.0 lb

## 2017-12-05 DIAGNOSIS — J320 Chronic maxillary sinusitis: Secondary | ICD-10-CM

## 2017-12-05 DIAGNOSIS — J40 Bronchitis, not specified as acute or chronic: Secondary | ICD-10-CM

## 2017-12-05 DIAGNOSIS — H90A21 Sensorineural hearing loss, unilateral, right ear, with restricted hearing on the contralateral side: Secondary | ICD-10-CM | POA: Diagnosis not present

## 2017-12-05 DIAGNOSIS — H90A32 Mixed conductive and sensorineural hearing loss, unilateral, left ear with restricted hearing on the contralateral side: Secondary | ICD-10-CM | POA: Diagnosis not present

## 2017-12-05 DIAGNOSIS — H6523 Chronic serous otitis media, bilateral: Secondary | ICD-10-CM | POA: Diagnosis not present

## 2017-12-05 DIAGNOSIS — R05 Cough: Secondary | ICD-10-CM | POA: Diagnosis not present

## 2017-12-05 DIAGNOSIS — H6993 Unspecified Eustachian tube disorder, bilateral: Secondary | ICD-10-CM | POA: Diagnosis not present

## 2017-12-05 MED ORDER — PREDNISONE 20 MG PO TABS: 40.0000 mg | ORAL_TABLET | Freq: Every day | ORAL | 0 refills | Status: DC

## 2017-12-05 MED ORDER — BENZONATATE 200 MG PO CAPS: 200.0000 mg | ORAL_CAPSULE | Freq: Three times a day (TID) | ORAL | 0 refills | Status: DC | PRN

## 2017-12-05 NOTE — Patient Instructions (Addendum)
Tessalon perrles Prednisone taper  CXR- 2 view  F/U as needed

## 2017-12-05 NOTE — Progress Notes (Signed)
Subjective:    Patient ID: Brett [name], male    DOB: 25-May-1945, 72 y.o.   MRN: [id number]  Patient presents for Illness (x weeks- productive cough with green colored mucus, chest congestion, sinus pressure- has been seenat ENT and given ABTx x2 with no relief)  Pt here with cough with production, sinus pressure.  He was seen on 6/26 with symptoms after returning from a trip, he was treated with amoxicillin, oral anti-histamine, OTC cough med, declined nasal spray He was then seen by ENT  Dr. Ezzard Standing given another round of amoxicillin and flonase  No wheezing, denies an SOB Cough throughout the day with production, post nasal drip States ENT discussed possible putting whole in ear drum to drain fluid, has appt this evening     Review Of Systems:  GEN- denies fatigue, fever, weight loss,weakness, recent illness HEENT- denies eye drainage, change in vision,+ nasal discharge, CVS- denies chest pain, palpitations RESP- denies SOB, +cough, wheeze ABD- denies N/V, change in stools, abd pain GU- denies dysuria, hematuria, dribbling, incontinence MSK- denies joint pain, muscle aches, injury Neuro- denies headache, dizziness, syncope, seizure activity       Objective:    BP (!) 146/82   Pulse 68   Temp 98.2 F (36.8 C) (Oral)   Resp 14   Ht 5\' 8"  (1.727 m)   Wt 188 lb (85.3 kg)   SpO2 97%   BMI 28.59 kg/m  GEN- NAD, alert and oriented x3 HEENT- PERRL, EOMI, non injected sclera, pink conjunctiva, MMM, oropharynx clear , TM clear bilat no effusion,  No  maxillary sinus tenderness, inflammed turbinates,  No nasal drainage  Neck- Supple, shotty right submandibular LAD CVS- RRR, no murmur RESP-CTAB EXT- No edema Pulses- Radial 2+        Assessment & Plan:      Problem List Items Addressed This Visit    None    Visit Diagnoses    Bronchitis    -  Primary   more bronchitic irritated cough, from previous URI/ sinusitis with post nasal drip, obtain CXR as > 6 weeks and  his age, no rales or wheeze, normal oxygen sat,   Relevant Orders   DG Chest 2 View   Chronic maxillary sinusitis       continue allergy med, also given prednisone, has appt with ENT this evening,. tessalon perrles for cough   Relevant Medications   benzonatate (TESSALON) 200 MG capsule   predniSONE (DELTASONE) 20 MG tablet      Note: This dictation was prepared with Dragon dictation along with smaller phrase technology. Any transcriptional errors that result from this process are unintentional.

## 2017-12-30 DIAGNOSIS — H34832 Tributary (branch) retinal vein occlusion, left eye, with macular edema: Secondary | ICD-10-CM | POA: Diagnosis not present

## 2018-01-02 DIAGNOSIS — M1611 Unilateral primary osteoarthritis, right hip: Secondary | ICD-10-CM | POA: Diagnosis not present

## 2018-01-09 ENCOUNTER — Ambulatory Visit (INDEPENDENT_AMBULATORY_CARE_PROVIDER_SITE_OTHER): Payer: Medicare HMO | Admitting: Family Medicine

## 2018-01-09 ENCOUNTER — Encounter: Payer: Self-pay | Admitting: Family Medicine

## 2018-01-09 VITALS — BP 136/80 | HR 68 | Temp 97.8°F | Resp 16 | Ht 68.0 in | Wt 189.0 lb

## 2018-01-09 DIAGNOSIS — J320 Chronic maxillary sinusitis: Secondary | ICD-10-CM | POA: Diagnosis not present

## 2018-01-09 MED ORDER — PREDNISONE 20 MG PO TABS: 40.0000 mg | ORAL_TABLET | Freq: Every day | ORAL | 0 refills | Status: DC

## 2018-01-09 MED ORDER — AMOXICILLIN-POT CLAVULANATE 875-125 MG PO TABS: 1.0000 | ORAL_TABLET | Freq: Two times a day (BID) | ORAL | 0 refills | Status: DC

## 2018-01-09 NOTE — Progress Notes (Signed)
Subjective:    Patient ID: Brett [name], male    DOB: 1945/12/10, 72 y.o.   MRN: [id number]  HPI  Patient has been battling his sinuses off and on his entire life.  More than a month ago, he developed head congestion, rhinorrhea, sinus pain and pressure.  He was treated with antibiotics and steroids which did not alleviate the symptoms.  He was referred to ENT which extended the antibiotics with amoxicillin and eventually his eustachian tube dysfunction in his left ear improved however he continues to have pressure in both maxillary sinuses left greater than right as well as both frontal sinuses.  He has rhinorrhea, head congestion, postnasal drip.  He is unable to breathe through his nostril.  He is getting sinus headaches.  He denies any fevers or chills.  Symptoms have gone on now more than 6 weeks.  He is currently taking Flonase without any relief.  He is taking an over-the-counter antihistamine which he believes is Zyrtec without any relief.  He has not tried allergy testing or allergy shots or Singulair Past Medical History:  Diagnosis Date  . Allergy   . Arthritis   . Cataract   . GERD (gastroesophageal reflux disease)   . History of hiatal hernia   . Hyperlipidemia   . Pneumonia    hx. of  . PONV (postoperative nausea and vomiting)    with gallbladder surgery  . Sleep apnea    Past Surgical History:  Procedure Laterality Date  . CARPAL TUNNEL RELEASE Bilateral   . CHOLECYSTECTOMY  2004  . COLONOSCOPY    . ESOPHAGOGASTRODUODENOSCOPY    . EYE SURGERY     cataract  . FRACTURE SURGERY  2006   fx. wrist  . JOINT REPLACEMENT    . rhino plasty     deviated septum  . SHOULDER SURGERY  2011   rotator cuff  . TOTAL HIP ARTHROPLASTY Right 12/13/2016   Procedure: RIGHT TOTAL HIP ARTHROPLASTY ANTERIOR APPROACH;  Surgeon: Samson Frederic, MD;  Location: [facility] OR;  Service: Orthopedics;  Laterality: Right;  . TOTAL SHOULDER ARTHROPLASTY Right 04/15/2017   Procedure: RIGHT TOTAL  SHOULDER ARTHROPLASTY;  Surgeon: Beverely Low, MD;  Location[facility];  Service: Orthopedics;  Laterality: Right;  Marland Kitchen VASECTOMY  1980   Current Outpatient Medications on File Prior to Visit  Medication Sig Dispense Refill  . colestipol (COLESTID) 1 g tablet TAKE 1 TABLET TWICE A DAY 180 tablet 1  . diclofenac (VOLTAREN) 75 MG EC tablet TAKE 1 TABLET TWICE A DAY 180 tablet 2  . fenofibrate (TRICOR) 48 MG tablet Take 1 tablet (48 mg total) by mouth daily. 90 tablet 3  . omeprazole (PRILOSEC) 20 MG capsule TAKE 1 CAPSULE DAILY 90 capsule 2  . tamsulosin (FLOMAX) 0.4 MG CAPS capsule TAKE 1 CAPSULE DAILY 90 capsule 3  . zolpidem (AMBIEN) 10 MG tablet Take 1 tablet (10 mg total) by mouth at bedtime as needed for sleep. 15 tablet 1   No current facility-administered medications on file prior to visit.    Allergies  Allergen Reactions  . Tramadol Nausea Only   Social History   Socioeconomic History  . Marital status: Married    Spouse name: Not on file  . Number of children: Not on file  . Years of education: Not on file  . Highest education level: Not on file  Occupational History  . Not on file  Social Needs  . Financial resource strain: Not on file  . Food insecurity:  Worry: Not on file    Inability: Not on file  . Transportation needs:    Medical: Not on file    Non-medical: Not on file  Tobacco Use  . Smoking status: Former Smoker    Last attempt to quit: 05/23/2010    Years since quitting: 7.6  . Smokeless tobacco: Never Used  Substance and Sexual Activity  . Alcohol use: No  . Drug use: No  . Sexual activity: Yes  Lifestyle  . Physical activity:    Days per week: Not on file    Minutes per session: Not on file  . Stress: Not on file  Relationships  . Social connections:    Talks on phone: Not on file    Gets together: Not on file    Attends religious service: Not on file    Active member of club or organization: Not on file    Attends meetings of clubs or  organizations: Not on file    Relationship status: Not on file  . Intimate partner violence:    Fear of current or ex partner: Not on file    Emotionally abused: Not on file    Physically abused: Not on file    Forced sexual activity: Not on file  Other Topics Concern  . Not on file  Social History Narrative  . Not on file     Review of Systems  All other systems reviewed and are negative.      Objective:   Physical Exam  Constitutional: He appears well-developed and well-nourished.  HENT:  Right Ear: External ear normal.  Left Ear: External ear normal.  Nose: Mucosal edema and rhinorrhea present. Right sinus exhibits maxillary sinus tenderness and frontal sinus tenderness. Left sinus exhibits maxillary sinus tenderness and frontal sinus tenderness.  Mouth/Throat: Oropharynx is clear and moist.  Cardiovascular: Normal rate and regular rhythm.  Murmur heard. Pulmonary/Chest: Effort normal and breath sounds normal. No stridor. No respiratory distress. He has no wheezes. He has no rales.  Skin: He is not diaphoretic.  Vitals reviewed.         Assessment & Plan:  Chronic sinusitis of both maxillary sinuses - Plan: amoxicillin-clavulanate (AUGMENTIN) 875-125 MG tablet, predniSONE (DELTASONE) 20 MG tablet  Patient appears to have acute on chronic sinusitis.  I will begin with 3 weeks of Augmentin 875 mg p.o. twice daily for 21 days.  I will also use prednisone 40 mg a day for 7 days.  Continue Flonase and antihistamines.  If symptoms improve, I would recommend seeing an allergist for possible allergy testing as a means to help prevent this in the future as I believe allergies may be a large component to his chronic sinusitis and he is failing typical preventative strategies such as intranasal glucocorticoids as well as antihistamines.

## 2018-02-01 DIAGNOSIS — H34832 Tributary (branch) retinal vein occlusion, left eye, with macular edema: Secondary | ICD-10-CM | POA: Diagnosis not present

## 2018-02-03 ENCOUNTER — Ambulatory Visit (INDEPENDENT_AMBULATORY_CARE_PROVIDER_SITE_OTHER): Payer: Medicare HMO | Admitting: Family Medicine

## 2018-02-03 ENCOUNTER — Encounter: Payer: Self-pay | Admitting: Family Medicine

## 2018-02-03 ENCOUNTER — Other Ambulatory Visit: Payer: Self-pay

## 2018-02-03 VITALS — BP 144/88 | HR 86 | Temp 99.1°F | Resp 16 | Ht 68.0 in | Wt 187.0 lb

## 2018-02-03 DIAGNOSIS — J0191 Acute recurrent sinusitis, unspecified: Secondary | ICD-10-CM

## 2018-02-03 DIAGNOSIS — J302 Other seasonal allergic rhinitis: Secondary | ICD-10-CM

## 2018-02-03 DIAGNOSIS — J342 Deviated nasal septum: Secondary | ICD-10-CM | POA: Diagnosis not present

## 2018-02-03 MED ORDER — FLUTICASONE PROPIONATE 50 MCG/ACT NA SUSP: 2.0000 | Freq: Every day | NASAL | 6 refills | Status: DC

## 2018-02-03 MED ORDER — PREDNISONE 20 MG PO TABS: ORAL_TABLET | ORAL | 0 refills | Status: DC

## 2018-02-03 MED ORDER — MONTELUKAST SODIUM 10 MG PO TABS: 10.0000 mg | ORAL_TABLET | Freq: Every day | ORAL | 0 refills | Status: DC

## 2018-02-03 MED ORDER — DOXYCYCLINE HYCLATE 100 MG PO TABS: 100.0000 mg | ORAL_TABLET | Freq: Two times a day (BID) | ORAL | 0 refills | Status: DC

## 2018-02-03 MED ORDER — MONTELUKAST SODIUM 10 MG PO TABS: 10.0000 mg | ORAL_TABLET | Freq: Every day | ORAL | 2 refills | Status: DC

## 2018-02-03 NOTE — Progress Notes (Signed)
Patient ID: Brett [name], male    DOB: Jun 18, 1945, 72 y.o.   MRN: [id number]  PCP: Salley Scarlet, MD  Chief Complaint  Patient presents with  . Illness    x 8 weeks- sinus infection, pressure, HA, ear pressure, nasal drainage- has been seen at ENT    Subjective:   Brett [name] is a 72 y.o. male, presents to clinic with CC of sinus infection for 8 weeks.  Reports that this sinus infection is causing sinus pain and pressure with headache across his forehead and his temples with associated ear pain and pressure and purulent nasal drainage.  He was recently treated with 3 weeks of antibiotics and steroids for 1 week, he just finished this last week and began feeling worse again.  He did see ENT  in July treated with steroids. Denies fever, neck pain, sore throat, cough, nausea, vomiting.  He states that a similar worsening of his sinus pain and pressure with difficulty treating happened to him in [place] many years ago and lasted 2 years. Also reports a past medical history related to chronic sinusitis and ENT procedures , "years and years ago" rhinoplasty, sinuses worked on in the past sinuses could not get everything out of left frontal sinus, where he is currently still feeling pressure.    ENT - Ezzard Standing   Patient Active Problem List   Diagnosis Date Noted  . S/P shoulder replacement, right 04/15/2017  . Osteoarthritis of right hip 12/13/2016  . Essential hypertension 04/10/2014  . Insomnia due to stress 04/10/2014  . DDD (degenerative disc disease), cervical 04/10/2014  . Glucose intolerance (impaired glucose tolerance) 10/09/2013  . Hypertriglyceridemia 04/13/2013  . GERD (gastroesophageal reflux disease) 04/13/2013  . OSA on CPAP 04/13/2013  . Chronic shoulder pain 04/13/2013     Prior to Admission medications   Medication Sig Start Date End Date Taking? Authorizing Provider  colestipol (COLESTID) 1 g tablet TAKE 1 TABLET TWICE A DAY 01/31/17  Yes [place], Velna Hatchet, MD    diclofenac (VOLTAREN) 75 MG EC tablet TAKE 1 TABLET TWICE A DAY 07/26/16  Yes [place], Velna Hatchet, MD  fenofibrate (TRICOR) 48 MG tablet Take 1 tablet (48 mg total) by mouth daily. 05/19/17  Yes [place], Velna Hatchet, MD  omeprazole (PRILOSEC) 20 MG capsule TAKE 1 CAPSULE DAILY 05/24/17  Yes [place], Velna Hatchet, MD  tamsulosin [facility]) 0.4 MG CAPS capsule TAKE 1 CAPSULE DAILY 10/17/17  Yes [place], Velna Hatchet, MD  zolpidem (AMBIEN) 10 MG tablet Take 1 tablet (10 mg total) by mouth at bedtime as needed for sleep. 04/19/17  Yes [place], Velna Hatchet, MD     Allergies  Allergen Reactions  . Tramadol Nausea Only     Family History  Problem Relation Age of Onset  . Cancer Sister        uterine   . Cancer Maternal Grandmother      Social History   Socioeconomic History  . Marital status: Married    Spouse name: Not on file  . Number of children: Not on file  . Years of education: Not on file  . Highest education level: Not on file  Occupational History  . Not on file  Social Needs  . Financial resource strain: Not on file  . Food insecurity:    Worry: Not on file    Inability: Not on file  . Transportation needs:    Medical: Not on file    Non-medical: Not on file  Tobacco Use  .  Smoking status: Former Smoker    Last attempt to quit: 05/23/2010    Years since quitting: 7.7  . Smokeless tobacco: Never Used  Substance and Sexual Activity  . Alcohol use: No  . Drug use: No  . Sexual activity: Yes  Lifestyle  . Physical activity:    Days per week: Not on file    Minutes per session: Not on file  . Stress: Not on file  Relationships  . Social connections:    Talks on phone: Not on file    Gets together: Not on file    Attends religious service: Not on file    Active member of club or organization: Not on file    Attends meetings of clubs or organizations: Not on file    Relationship status: Not on file  . Intimate partner violence:    Fear of current or ex partner: Not on file     Emotionally abused: Not on file    Physically abused: Not on file    Forced sexual activity: Not on file  Other Topics Concern  . Not on file  Social History Narrative  . Not on file     Review of Systems  Constitutional: Negative.   HENT: Negative.   Eyes: Negative.   Respiratory: Negative.   Cardiovascular: Negative.   Gastrointestinal: Negative.   Endocrine: Negative.   Genitourinary: Negative.   Musculoskeletal: Negative.   Skin: Negative.   Allergic/Immunologic: Negative.   Neurological: Negative.   Hematological: Negative.   Psychiatric/Behavioral: Negative.   All other systems reviewed and are negative.      Objective:    Vitals:   02/03/18 1056  BP: (!) 144/88  Pulse: 86  Resp: 16  Temp: 99.1 F (37.3 C)  TempSrc: Oral  SpO2: 98%  Weight: 187 lb (84.8 kg)  Height: 5\' 8"  (1.727 m)      Physical Exam  Constitutional: He appears well-developed and well-nourished. No distress.  Mildly ill-appearing male, appears younger than stated age, nontoxic appearing, no acute distress  HENT:  Head: Normocephalic and atraumatic. Head is without right periorbital erythema and without left periorbital erythema.  Right Ear: Tympanic membrane, external ear and ear canal normal.  Left Ear: Tympanic membrane, external ear and ear canal normal.  Nose: Mucosal edema, rhinorrhea, nasal deformity and septal deviation present.  Mouth/Throat: Uvula is midline and mucous membranes are normal. Mucous membranes are not pale, not dry and not cyanotic. No trismus in the jaw. No uvula swelling. No oropharyngeal exudate or tonsillar abscesses. No tonsillar exudate.  Septum deviated to the right with right nasal mucosa more erythematous and edematous, copious nasal discharge Left nare with easier visible sensation of nasal turbinate which is enlarged and pale also with copious nasal discharge Sinus tenderness to palpation most severe to the left frontal sinus Posterior oropharynx right  soft palate and tonsillar pillars mildly erythematous, uvula midline, no exudate or edema Ears bilaterally normal No cervical lymphadenopathy  Eyes: Pupils are equal, round, and reactive to light. EOM and lids are normal. Right eye exhibits no discharge. Left eye exhibits no discharge. Right conjunctiva is injected. Right conjunctiva has no hemorrhage. Left conjunctiva is injected. Left conjunctiva has no hemorrhage.  Mildly injected conjunctiva bilaterally without discharge  Neck: No tracheal deviation present.  Cardiovascular: Normal rate and regular rhythm.  Pulmonary/Chest: Effort normal. No accessory muscle usage or stridor. No tachypnea. No respiratory distress. He has no decreased breath sounds. He has no wheezes. He has no rhonchi. He  has no rales.  Musculoskeletal: Normal range of motion.  Neurological: He is alert. He exhibits normal muscle tone. Coordination normal.  Skin: Skin is warm and dry. No rash noted. He is not diaphoretic.  Psychiatric: He has a normal mood and affect. His behavior is normal.  Nursing note and vitals reviewed.         Assessment & Plan:      ICD-10-CM   1. Acute recurrent sinusitis, unspecified location J01.91 fluticasone (FLONASE) 50 MCG/ACT nasal spray    cetirizine (ZYRTEC) 10 MG tablet    doxycycline (VIBRA-TABS) 100 MG tablet    predniSONE (DELTASONE) 20 MG tablet    montelukast (SINGULAIR) 10 MG tablet  2. Deviated septum J34.2   3. Seasonal allergies J30.2 fluticasone (FLONASE) 50 MCG/ACT nasal spray    cetirizine (ZYRTEC) 10 MG tablet    predniSONE (DELTASONE) 20 MG tablet    montelukast (SINGULAIR) 10 MG tablet    72 year old male with past medical history of seasonal allergies, status post septoplasty many years ago, presents with several months of worsening rhinosinusitis.  On 01/09/2018 he was seen by Dr. Tanya Nones at the same office was put on 3 weeks of Augmentin.  He states that he finished it 5 days ago and immediately began to worsen  with more congestion, postnasal drip and sinus tenderness.   He did see ENT roughly 2 months ago but it was for decreased hearing and sensation of blocked ears, this did improve with antibiotics and steroid burst at that time he has not been back to ENT.    Patient does have some structural abnormalities and asymmetry of nasal septum and nasal mucosa, also appears to have infection.  Is failed 3 weeks of Augmentin, will start treatment with a steroid burst and doxycycline.  Montelukast to try and improve allergy symptoms.  Patient was instructed to call his ENT, Dr. Ezzard Standing and get appointment for evaluation of his nasal and sinus symptoms.  He is to call our office if they require a new referral for this since last time he was seen for his ears.   Danelle Berry, PA-C 02/03/18 11:14 AM

## 2018-02-09 DIAGNOSIS — G4733 Obstructive sleep apnea (adult) (pediatric): Secondary | ICD-10-CM | POA: Diagnosis not present

## 2018-02-14 DIAGNOSIS — J329 Chronic sinusitis, unspecified: Secondary | ICD-10-CM | POA: Diagnosis not present

## 2018-02-20 ENCOUNTER — Other Ambulatory Visit: Payer: Self-pay | Admitting: Otolaryngology

## 2018-02-20 DIAGNOSIS — J320 Chronic maxillary sinusitis: Secondary | ICD-10-CM

## 2018-02-21 ENCOUNTER — Ambulatory Visit
Admission: RE | Admit: 2018-02-21 | Discharge: 2018-02-21 | Disposition: A | Discharge status: Home / Self Care | Payer: Medicare HMO | Source: Ambulatory Visit | Attending: Otolaryngology | Admitting: Otolaryngology

## 2018-02-21 ENCOUNTER — Ambulatory Visit (INDEPENDENT_AMBULATORY_CARE_PROVIDER_SITE_OTHER): Payer: Medicare HMO | Admitting: *Deleted

## 2018-02-21 DIAGNOSIS — Z23 Encounter for immunization: Secondary | ICD-10-CM

## 2018-02-21 DIAGNOSIS — J320 Chronic maxillary sinusitis: Secondary | ICD-10-CM

## 2018-02-21 DIAGNOSIS — J3489 Other specified disorders of nose and nasal sinuses: Secondary | ICD-10-CM | POA: Diagnosis not present

## 2018-02-24 DIAGNOSIS — J329 Chronic sinusitis, unspecified: Secondary | ICD-10-CM | POA: Diagnosis not present

## 2018-02-24 DIAGNOSIS — J342 Deviated nasal septum: Secondary | ICD-10-CM | POA: Diagnosis not present

## 2018-02-24 DIAGNOSIS — H6522 Chronic serous otitis media, left ear: Secondary | ICD-10-CM | POA: Diagnosis not present

## 2018-02-24 DIAGNOSIS — J310 Chronic rhinitis: Secondary | ICD-10-CM | POA: Diagnosis not present

## 2018-03-10 DIAGNOSIS — J310 Chronic rhinitis: Secondary | ICD-10-CM | POA: Diagnosis not present

## 2018-03-10 DIAGNOSIS — H6993 Unspecified Eustachian tube disorder, bilateral: Secondary | ICD-10-CM | POA: Diagnosis not present

## 2018-03-22 DIAGNOSIS — H34832 Tributary (branch) retinal vein occlusion, left eye, with macular edema: Secondary | ICD-10-CM | POA: Diagnosis not present

## 2018-04-20 DIAGNOSIS — G4733 Obstructive sleep apnea (adult) (pediatric): Secondary | ICD-10-CM | POA: Diagnosis not present

## 2018-04-23 ENCOUNTER — Other Ambulatory Visit: Payer: Self-pay | Admitting: Family Medicine

## 2018-04-26 DIAGNOSIS — H43812 Vitreous degeneration, left eye: Secondary | ICD-10-CM | POA: Diagnosis not present

## 2018-04-26 DIAGNOSIS — H34832 Tributary (branch) retinal vein occlusion, left eye, with macular edema: Secondary | ICD-10-CM | POA: Diagnosis not present

## 2018-05-08 ENCOUNTER — Other Ambulatory Visit: Payer: Self-pay | Admitting: Family Medicine

## 2018-05-13 DIAGNOSIS — G4733 Obstructive sleep apnea (adult) (pediatric): Secondary | ICD-10-CM | POA: Diagnosis not present

## 2018-05-17 HISTORY — PX: TYMPANOSTOMY TUBE PLACEMENT: SHX32

## 2018-05-17 HISTORY — PX: KNEE SURGERY: SHX244

## 2018-05-19 ENCOUNTER — Encounter: Payer: Self-pay | Admitting: Family Medicine

## 2018-05-19 ENCOUNTER — Other Ambulatory Visit: Payer: Self-pay

## 2018-05-19 ENCOUNTER — Ambulatory Visit (INDEPENDENT_AMBULATORY_CARE_PROVIDER_SITE_OTHER): Payer: Medicare HMO | Admitting: Family Medicine

## 2018-05-19 VITALS — BP 138/84 | HR 82 | Temp 98.6°F | Resp 14 | Ht 68.0 in | Wt 194.0 lb

## 2018-05-19 DIAGNOSIS — R69 Illness, unspecified: Secondary | ICD-10-CM | POA: Diagnosis not present

## 2018-05-19 DIAGNOSIS — F5102 Adjustment insomnia: Secondary | ICD-10-CM

## 2018-05-19 DIAGNOSIS — H8110 Benign paroxysmal vertigo, unspecified ear: Secondary | ICD-10-CM | POA: Diagnosis not present

## 2018-05-19 DIAGNOSIS — Z0000 Encounter for general adult medical examination without abnormal findings: Secondary | ICD-10-CM | POA: Diagnosis not present

## 2018-05-19 DIAGNOSIS — Z9989 Dependence on other enabling machines and devices: Secondary | ICD-10-CM

## 2018-05-19 DIAGNOSIS — R0602 Shortness of breath: Secondary | ICD-10-CM

## 2018-05-19 DIAGNOSIS — G4733 Obstructive sleep apnea (adult) (pediatric): Secondary | ICD-10-CM | POA: Diagnosis not present

## 2018-05-19 DIAGNOSIS — Z125 Encounter for screening for malignant neoplasm of prostate: Secondary | ICD-10-CM

## 2018-05-19 DIAGNOSIS — E781 Pure hyperglyceridemia: Secondary | ICD-10-CM | POA: Diagnosis not present

## 2018-05-19 NOTE — Progress Notes (Signed)
Subjective:   Patient presents for Medicare Annual/Subsequent preventive examination.   Pt here for CPE  HTN- declines BP meds  Hyperlipidemia- taking colestid and tricor   BPH- on flmoax, last PSA 0.5 in  2018  Chronic insomnia- taking ambiem   OSA on CPAP - has noticed he has been more SOB, when exerting himself, also feels tired. Describes getting SOB working on his tractor putting some treads on. No SOB with everday activites. Has not had CPAP titrated in many years, feels the pressure isnt right . No CP, checks his oxygen sat it is typically 95%  Dizzy spells has history of BPV, saw neurology a couple years ago, spells getting worse if he moves his head or bends over. No N/V at this time, worse first thing in the morning. No falls, he has chronic nasal congestion, has seen ENT for this, using his meds     Review Past Medical/Family/Social: pER EMR   Risk Factors  Current exercise habits: walks Dietary issues discussed: yes  Cardiac risk factors: HTN, high triglyceries  Depression Screen  (Note: if answer to either of the following is "Yes", a more complete depression screening is indicated)  Over the past two weeks, have you felt down, depressed or hopeless? No Over the past two weeks, have you felt little interest or pleasure in doing things? No Have you lost interest or pleasure in daily life? No Do you often feel hopeless? No Do you cry easily over simple problems? No   Activities of Daily Living  In your present state of health, do you have any difficulty performing the following activities?:  Driving? No  Managing money? No  Feeding yourself? No  Getting from bed to chair? No  Climbing a flight of stairs? No  Preparing food and eating?: No  Bathing or showering? No  Getting dressed: No  Getting to the toilet? No  Using the toilet:No  Moving around from place to place: No  In the past year have you fallen or had a near fall?:No  Are you sexually active? No   Do you have more than one partner? No   Hearing Difficulties: No  Do you often ask people to speak up or repeat themselves? No  Do you experience ringing or noises in your ears? No Do you have difficulty understanding soft or whispered voices? No  Do you feel that you have a problem with memory? No Do you often misplace items? No  Do you feel safe at home? Yes  Cognitive Testing  Alert? Yes Normal Appearance?Yes  Oriented to person? Yes Place? Yes  Time? Yes  Recall of three objects? Yes  Can perform simple calculations? Yes  Displays appropriate judgment?Yes  Can read the correct time from a watch face?Yes   List the Names of Other Physician/Practitioners you currently use:   Orthopedics    Screening Tests / Date Colonoscopy   UTD                  Zostavax  UTD Pneumonia- UTD Influenza Vaccine  UTD Tetanus/tdapUTD  ROS: GEN- denies fatigue, fever, weight loss,weakness, recent illness HEENT- denies eye drainage, change in vision, nasal discharge, CVS- denies chest pain, palpitations RESP-+ SOB, denies  cough, wheeze ABD- denies N/V, change in stools, abd pain GU- denies dysuria, hematuria, dribbling, incontinence MSK- denies joint pain, muscle aches, injury Neuro- denies headache, +dizziness, syncope, seizure activity  GEN- NAD, alert and oriented x3,vitals reviewed HEENT- PERRL, EOMI, non injected sclera, pink conjunctiva,  MMM, oropharynx clear Neck- Supple, no thryomegaly CVS- RRR, no murmur RESP-CTAB ABD-nabs,soft, NT,ND Neuro-CNII-XII in tact, no focal deficits  EXT- No edema Pulses- Radial, DP- 2+  EKG- sinus bradycardia, no ST changes HR 55  Assessment:    Annual wellness medicare exam   Plan:    During the course of the visit the patient was educated and counseled about appropriate screening and preventive services including:  Audit C/ depression/fall screening negative  Pt has advanced directives  OSA- send for CPAP titration  SOB- some  occurs when he exerting himself a lot with at his age is not abnormal, EKG reassuring he has had mild bradycaria for many years, no syncopy or chest pain. Check labs today.  Per above for CPAP      Hold on stress test , Echo see how above results first  no sign of CHF   BPV- meclizine did not help much in past, was relieved by Vestibular rehab, he prefers to try this  Nasal congestion/sinusitis- chronic, use nasal saline rinse along with his other meds, the can also worsen his dizziness  PSA screening to be done    Insomnia continue ambien prn   Hyperlipidemia- continue current meds check labs   HTN- bp looks okay for now, continue to monitor   Diet review for nutrition referral? Yes ____ Not Indicated __x__  Patient Instructions (the written plan) was given to the patient.  Medicare Attestation  I have personally reviewed:  The patient's medical and social history  Their use of alcohol, tobacco or illicit drugs  Their current medications and supplements  The patient's functional ability including ADLs,fall risks, home safety risks, cognitive, and hearing and visual impairment  Diet and physical activities  Evidence for depression or mood disorders  The patient's weight, height, BMI, and visual acuity have been recorded in the chart. I have made referrals, counseling, and provided education to the patient based on review of the above and I have provided the patient with a written personalized care plan for preventive services.

## 2018-05-19 NOTE — Patient Instructions (Addendum)
F/u 6 MONTHS  CPAP titration to be done  Vertigo- referral for vestibular rehab Try the nasal saline for congestion

## 2018-05-20 ENCOUNTER — Encounter: Payer: Self-pay | Admitting: Family Medicine

## 2018-05-20 LAB — COMPREHENSIVE METABOLIC PANEL
AG Ratio: 1.9 (calc) (ref 1.0–2.5)
ALT: 19 U/L (ref 9–46)
AST: 23 U/L (ref 10–35)
Albumin: 4.2 g/dL (ref 3.6–5.1)
Alkaline phosphatase (APISO): 41 U/L (ref 40–115)
BUN: 17 mg/dL (ref 7–25)
CO2: 25 mmol/L (ref 20–32)
Calcium: 9.1 mg/dL (ref 8.6–10.3)
Chloride: 106 mmol/L (ref 98–110)
Creat: 1.06 mg/dL (ref 0.70–1.18)
Globulin: 2.2 g/dL (calc) (ref 1.9–3.7)
Glucose, Bld: 100 mg/dL — ABNORMAL HIGH (ref 65–99)
Potassium: 4.7 mmol/L (ref 3.5–5.3)
Sodium: 141 mmol/L (ref 135–146)
Total Bilirubin: 0.5 mg/dL (ref 0.2–1.2)
Total Protein: 6.4 g/dL (ref 6.1–8.1)

## 2018-05-20 LAB — LIPID PANEL
Cholesterol: 226 mg/dL — ABNORMAL HIGH (ref <200)
HDL: 42 mg/dL (ref >40)
LDL Cholesterol (Calc): 161 mg/dL (calc) — ABNORMAL HIGH
Non-HDL Cholesterol (Calc): 184 mg/dL (calc) — ABNORMAL HIGH (ref <130)
Total CHOL/HDL Ratio: 5.4 (calc) — ABNORMAL HIGH (ref <5.0)
Triglycerides: 112 mg/dL (ref <150)

## 2018-05-20 LAB — CBC WITH DIFFERENTIAL/PLATELET
Absolute Monocytes: 504 cells/uL (ref 200–950)
Basophils Absolute: 83 cells/uL (ref 0–200)
Basophils Relative: 1.2 %
Eosinophils Absolute: 552 cells/uL — ABNORMAL HIGH (ref 15–500)
Eosinophils Relative: 8 %
HCT: 41.2 % (ref 38.5–50.0)
Hemoglobin: 13.5 g/dL (ref 13.2–17.1)
Lymphs Abs: 1656 cells/uL (ref 850–3900)
MCH: 27.1 pg (ref 27.0–33.0)
MCHC: 32.8 g/dL (ref 32.0–36.0)
MCV: 82.6 fL (ref 80.0–100.0)
MPV: 9.8 fL (ref 7.5–12.5)
Monocytes Relative: 7.3 %
Neutro Abs: 4106 cells/uL (ref 1500–7800)
Neutrophils Relative %: 59.5 %
Platelets: 266 10*3/uL (ref 140–400)
RBC: 4.99 10*6/uL (ref 4.20–5.80)
RDW: 13.1 % (ref 11.0–15.0)
Total Lymphocyte: 24 %
WBC: 6.9 10*3/uL (ref 3.8–10.8)

## 2018-05-20 LAB — PSA: PSA: 0.6 ng/mL (ref <=4.0)

## 2018-05-22 ENCOUNTER — Other Ambulatory Visit: Payer: Self-pay | Admitting: Family Medicine

## 2018-05-22 DIAGNOSIS — Z9989 Dependence on other enabling machines and devices: Principal | ICD-10-CM

## 2018-05-22 DIAGNOSIS — G4733 Obstructive sleep apnea (adult) (pediatric): Secondary | ICD-10-CM

## 2018-05-24 DIAGNOSIS — M25562 Pain in left knee: Secondary | ICD-10-CM | POA: Diagnosis not present

## 2018-05-25 ENCOUNTER — Other Ambulatory Visit: Payer: Self-pay | Admitting: Family Medicine

## 2018-05-26 ENCOUNTER — Telehealth: Payer: Self-pay | Admitting: Family Medicine

## 2018-05-26 MED ORDER — DICLOFENAC SODIUM 75 MG PO TBEC: 75.0000 mg | DELAYED_RELEASE_TABLET | Freq: Two times a day (BID) | ORAL | 2 refills | Status: DC

## 2018-05-26 NOTE — Telephone Encounter (Signed)
Patient calling to get refill on diclofenac [facility]  [facility]

## 2018-05-26 NOTE — Telephone Encounter (Signed)
Prescription sent to pharmacy.

## 2018-05-30 DIAGNOSIS — M25562 Pain in left knee: Secondary | ICD-10-CM | POA: Diagnosis not present

## 2018-06-02 DIAGNOSIS — H34832 Tributary (branch) retinal vein occlusion, left eye, with macular edema: Secondary | ICD-10-CM | POA: Diagnosis not present

## 2018-06-07 DIAGNOSIS — S83242A Other tear of medial meniscus, current injury, left knee, initial encounter: Secondary | ICD-10-CM | POA: Diagnosis not present

## 2018-06-13 ENCOUNTER — Ambulatory Visit (HOSPITAL_COMMUNITY): Payer: Medicare HMO | Attending: Family Medicine

## 2018-06-13 ENCOUNTER — Encounter (HOSPITAL_COMMUNITY): Payer: Self-pay

## 2018-06-13 ENCOUNTER — Other Ambulatory Visit: Payer: Self-pay

## 2018-06-13 DIAGNOSIS — H8111 Benign paroxysmal vertigo, right ear: Secondary | ICD-10-CM | POA: Diagnosis not present

## 2018-06-13 DIAGNOSIS — R42 Dizziness and giddiness: Secondary | ICD-10-CM | POA: Diagnosis not present

## 2018-06-13 NOTE — Therapy (Signed)
[facility] [address] [place], [place], [address] Phone: [phone]   Fax:  [phone]  Physical Therapy Evaluation  Patient Details  Name: Brett [name] MRN: [id number] Date of Birth: 12/03/1945 Referring Provider (PT): Milinda Antis, MD   Encounter Date: 06/13/2018  PT End of Session - 06/13/18 2030    Visit Number  1    Number of Visits  3    Date for PT Re-Evaluation  06/27/18    Authorization Type  [employer] HMO    Authorization Time Period  06/13/18 to 06/27/18    Authorization - Visit Number  1    Authorization - Number of Visits  10    PT Start Time  1258    PT Stop Time  1335    PT Time Calculation (min)  37 min    Activity Tolerance  Patient tolerated treatment well    Behavior During Therapy  [facility] for tasks assessed/performed       Past Medical History:  Diagnosis Date  . Allergy   . Arthritis   . Cataract   . GERD (gastroesophageal reflux disease)   . History of hiatal hernia   . Hyperlipidemia   . Pneumonia    hx. of  . PONV (postoperative nausea and vomiting)    with gallbladder surgery  . Sleep apnea     Past Surgical History:  Procedure Laterality Date  . CARPAL TUNNEL RELEASE Bilateral   . CHOLECYSTECTOMY  2004  . COLONOSCOPY    . ESOPHAGOGASTRODUODENOSCOPY    . EYE SURGERY     cataract  . FRACTURE SURGERY  2006   fx. wrist  . JOINT REPLACEMENT    . rhino plasty     deviated septum  . SHOULDER SURGERY  2011   rotator cuff  . TOTAL HIP ARTHROPLASTY Right 12/13/2016   Procedure: RIGHT TOTAL HIP ARTHROPLASTY ANTERIOR APPROACH;  Surgeon: Samson Frederic, MD;  Location: [facility] OR;  Service: Orthopedics;  Laterality: Right;  . TOTAL SHOULDER ARTHROPLASTY Right 04/15/2017   Procedure: RIGHT TOTAL SHOULDER ARTHROPLASTY;  Surgeon: Beverely Low, MD;  Location: [facility] OR;  Service: Orthopedics;  Laterality: Right;  Marland Kitchen VASECTOMY  1980    There were no vitals filed for this visit.   Subjective  Assessment - 06/13/18 1301    Subjective  Pt states that he had vertigo about 1.5YA after a fall. He went for the first treatment and then was treated and it helped it. He states that he feels his left eye twtiching and thinks the vertigo could be acting up. His wife states that his BP has been running high which has been going on for quite a while. He reports that if he turns his head real quick either direction he will feel the dizzniess, if he looks down and up quick. He has left eye retinal occlusion which he receives shots for; this happened almost 1YA from a "stroke in his eye" which they can't get it to stop bleeding. He was feeling symptoms when he stood up from sitting but since starting BP meds, he has not felt this. His symptoms last just a few minutes and reports that it feels like a "twitchy eye." No n/v or feelings of dizzniess or fatigue afterwards.     Patient Stated Goals  stop eye from twitching and r/o vertigo    Currently in Pain?  No/denies         [facility] PT Assessment - 06/13/18 0001  Assessment   Medical Diagnosis  BPPV    Referring Provider (PT)  Milinda Antis, MD    Onset Date/Surgical Date  --   at least 3 months   Prior Therapy  yes for BPPV ~1.5 years ago with success      Balance Screen   Has the patient fallen in the past 6 months  No    Has the patient had a decrease in activity level because of a fear of falling?   No    Is the patient reluctant to leave their home because of a fear of falling?   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      Optician, dispensing Standing - Balance Support  No upper extremity supported    Static Standing Balance -  Activities   Single Leg Stance - Right Leg;Single Leg Stance - Left Leg    Static Standing - Comment/# of Minutes  pt reporting  no LOB or falls with dizziness      Standardized Balance Assessment   Standardized Balance Assessment  Dynamic Gait Index           Vestibular Assessment - 06/13/18 0001      Vestibular Assessment   General Observation  pt reporting recreation fo symptoms with quck head turns "any direction" but feels worse to the L; reported some reports of symptoms with STS prior to BP medication but this has improved since starting meds. Reports he can feel his eye twtiching; h/o of this in the past       Symptom Behavior   Type of Dizziness  Comment   dizziness, feels eye spinning   Aggravating Factors  Turning head quickly;Supine to sit    Relieving Factors  Head stationary      Occulomotor Exam   Head shaking Horizontal  --   overshooting with Head impulse test to the L, +symptoms   Smooth Pursuits  Comment   intact; end range nystagmus thru out, more so with up and L   Saccades  Intact      Positional Testing   Dix-Hallpike  Dix-Hallpike Right;Dix-Hallpike Left      Dix-Hallpike Right   Dix-Hallpike Right Duration  <30seconds    Dix-Hallpike Right Symptoms  Downbeat, right rotatory nystagmus      Dix-Hallpike Left   Dix-Hallpike Left Duration  0    Dix-Hallpike Left Symptoms  No nystagmus   No symptoms         Objective measurements completed on examination: See above findings.              PT Education - 06/13/18 2030    Education Details  exam findings, pOC    Person(s) Educated  Patient;Spouse    Methods  Explanation    Comprehension  Verbalized understanding       PT Short Term Goals - 06/13/18 2039      PT SHORT TERM GOAL #1   Title  Pt will report reduced symptoms of dizziness to 0/10 on a daily basis with head movements in order to demo improved overall symptoms and allow him to perform all ADLs and IADLs without issues.    Time  2    Period  Weeks    Status  New    Target Date  06/27/18      PT SHORT TERM GOAL #2   Title  Pt and wife will  verbalize understanding of self-treatment of positional vertigo in order to promote independence with treatment at home and reduce his risk of recurrence.    Time  2    Period  Weeks    Status  New        PT Long Term Goals - 06/13/18 2040      PT LONG TERM GOAL #1   Title  n/a - 2 week POC -- will update as necssary if PT POC needs to be extended             Plan - 06/13/18 2031    Clinical Impression Statement  Pt ispleasant 72YO M who presents to OPPT with c/o vertigo/dizziness with certainhead movements and reports he can feel his L eye twitching. Pt had vertigoapproximately 1.5YA which was treated successfully with Dix-Hallpike assessmentand Epley Maneuver. He also reports h/o retinal occlusion in L eye stating hisMD told him he had a ?ostroke in his eye? but they have yet to find the artery responsiblefor the bleeding. He also reports HTN which is being treated with medications; sincestarting meds for his BP, he has not felt the sensation of dizziness with sit> stand transitional movements. He feels that most of his symptoms occur moreso with L head movements. Saccades WNL, end range nystagmus with all smooth pursuit,more so with up and L diagonal pattern. Dix-hallpike to the L negative fornystagmus or symptoms but pt +with Dix-Hallpike to the R as he had recreationof symptoms as well as rotary nystagmus. Went directly into Epley?Ts maneuver x2reps to treat. Pt with much reduced symptoms and no nystagmus during 2nd roundthrough. Pt educated to take it easy for the next day or so and that he could feel under the weather as a side effect of the treatment today. Pt needs brief stent in OPPT to address positional vertigo in order to reduce bouts of dizziness and maximize return to PLOF.    Clinical Presentation  Stable    Clinical Presentation due to:  see flowsheets for objective tests and measures    Clinical Decision Making  Low    Rehab Potential  Good    PT Frequency  1x /  week    PT Duration  2 weeks    PT Treatment/Interventions  ADLs/Self Care Home Management;Canalith Repostioning;Gait training;Functional mobility training;Therapeutic activities;Therapeutic exercise;Balance training;Neuromuscular re-education;Patient/family education;Manual techniques;Dry needling;Vestibular    PT Next Visit Plan  recheck dix-hallpike to the R and treat PRN; check horizontal canal for any involvement; teach self-tretment    PT Home Exercise Plan  provide self-treatment handout next visit     Consulted and Agree with Plan of Care  Patient;Family member/caregiver    Family Member Consulted  wife       Patient will benefit from skilled therapeutic intervention in order to improve the following deficits and impairments:  Impaired vision/preception, Dizziness  Visit Diagnosis: BPPV (benign paroxysmal positional vertigo), right - Plan: PT plan of care cert/re-cert  Dizziness and giddiness - Plan: PT plan of care cert/re-cert     Problem List Patient Active Problem List   Diagnosis Date Noted  . BPV (benign positional vertigo) 05/19/2018  . S/P shoulder replacement, right 04/15/2017  . Osteoarthritis of right hip 12/13/2016  . Essential hypertension 04/10/2014  . Insomnia due to stress 04/10/2014  . DDD (degenerative disc  disease), cervical 04/10/2014  . Glucose intolerance (impaired glucose tolerance) 10/09/2013  . Hypertriglyceridemia 04/13/2013  . GERD (gastroesophageal reflux disease) 04/13/2013  . OSA on CPAP 04/13/2013  . Chronic shoulder pain 04/13/2013       Jac Canavan PT, DPT  [facility] [address] [place], [place], [address] Phone: [phone]   Fax:  [phone]  Name: QI GIGNAC MRN: [id number] Date of Birth: 11/05/1945

## 2018-06-14 ENCOUNTER — Encounter: Payer: Self-pay | Admitting: Family Medicine

## 2018-06-14 ENCOUNTER — Ambulatory Visit
Admission: RE | Admit: 2018-06-14 | Discharge: 2018-06-14 | Disposition: A | Discharge status: Home / Self Care | Payer: Medicare HMO | Source: Ambulatory Visit | Attending: Family Medicine | Admitting: Family Medicine

## 2018-06-14 ENCOUNTER — Other Ambulatory Visit: Payer: Self-pay

## 2018-06-14 ENCOUNTER — Ambulatory Visit (INDEPENDENT_AMBULATORY_CARE_PROVIDER_SITE_OTHER): Payer: Medicare HMO | Admitting: Family Medicine

## 2018-06-14 VITALS — BP 150/78 | HR 78 | Temp 97.7°F | Resp 14 | Ht 68.0 in | Wt 191.0 lb

## 2018-06-14 DIAGNOSIS — R072 Precordial pain: Secondary | ICD-10-CM | POA: Diagnosis not present

## 2018-06-14 DIAGNOSIS — H8110 Benign paroxysmal vertigo, unspecified ear: Secondary | ICD-10-CM | POA: Diagnosis not present

## 2018-06-14 DIAGNOSIS — I10 Essential (primary) hypertension: Secondary | ICD-10-CM | POA: Diagnosis not present

## 2018-06-14 DIAGNOSIS — R0781 Pleurodynia: Secondary | ICD-10-CM

## 2018-06-14 NOTE — Assessment & Plan Note (Signed)
Had his first therapy session yesterday as a follow-up next week.  He is still feeling some of the dizziness at times.

## 2018-06-14 NOTE — Progress Notes (Signed)
Subjective:    Patient ID: Brett [name], male    DOB: February 20, 1946, 73 y.o.   MRN: [id number]  Patient presents for BP (having increased readings (180/80) and [facility] gave amlodipine) and Chest Wall Pain (possile cracked rib)   Pt here to f/u HTN bp at home 180/89 for about a week, also having headaches and worsening dizziness at that time.  He started recording his blood pressure 5 days before he went to [place]. Given NOrvasc milligrams which he has been on since the 23rd.  His blood pressure has now come down to the 150s his headaches have resolved  No further headaches    Vertigo seen by rehab yeterday had 1 procedure     [facility] 164/80  CPAP evaluation Feb 18    1 month ago was working on equipment and he believes that he hit himself in the chest.  He has had rib pain over the sternum as well as his right ribs for the past month it is not improving.  He has pain when he takes a deep breath.   Left knee athrscopy [facility]- torn meniscus surgery will be performed in March.  Review Of Systems:  GEN- denies fatigue, fever, weight loss,weakness, recent illness HEENT- denies eye drainage, change in vision, nasal discharge, CVS- denies chest pain, palpitations RESP- denies SOB, cough, wheeze ABD- denies N/V, change in stools, abd pain GU- denies dysuria, hematuria, dribbling, incontinence MSK- + joint pain, muscle aches, injury Neuro- denies headache, dizziness, syncope, seizure activity       Objective:    BP (!) 150/78 (BP Location: Left Arm, Patient Position: Sitting, Cuff Size: Large)   Pulse 78   Temp 97.7 F (36.5 C) (Oral)   Resp 14   Ht 5\' 8"  (1.727 m)   Wt 191 lb (86.6 kg)   SpO2 99%   BMI 29.04 kg/m  GEN- NAD, alert and oriented x3 Repeat 143/78 HEENT- PERRL, EOMI, non injected sclera, pink conjunctiva, MMM, oropharynx clear CVS- RRR, no murmur RESP-CTAB Chest wall- TTP mid sternum and adjacent right ribs, no step off palpated  EXT- No  edema Pulses- Radial 2+        Assessment & Plan:      Problem List Items Addressed This Visit      Unprioritized   BPV (benign positional vertigo)    Had his first therapy session yesterday as a follow-up next week.  He is still feeling some of the dizziness at times.      Essential hypertension    Past he has declined taking medication however his blood pressure was never in the 180s.  He recently started on amlodipine will have him check his blood pressures over the next week and give me a call if he is still above 150s at home but I will increase his amlodipine to 10 mg once a day.  He did have recent fasting labs done and renal function.  With regards to his rib pain as he did have trauma to the sternal area and then obtain a chest x-ray.  His home blood pressure machine was tested against our readings here in the office and was compatible.      Relevant Medications   amLODipine (NORVASC) 5 MG tablet    Other Visit Diagnoses    Rib pain on right side    -  Primary   Relevant Orders   DG Chest 2 View  Note: This dictation was prepared with Dragon dictation along with smaller phrase technology. Any transcriptional errors that result from this process are unintentional.

## 2018-06-14 NOTE — Assessment & Plan Note (Signed)
Past he has declined taking medication however his blood pressure was never in the 180s.  He recently started on amlodipine will have him check his blood pressures over the next week and give me a call if he is still above 150s at home but I will increase his amlodipine to 10 mg once a day.  He did have recent fasting labs done and renal function.  With regards to his rib pain as he did have trauma to the sternal area and then obtain a chest x-ray.  His home blood pressure machine was tested against our readings here in the office and was compatible.

## 2018-06-14 NOTE — Patient Instructions (Addendum)
[place] for [employer] me in 1 week with blood pressures readings Continue the norvasc  F/U as previous

## 2018-06-21 ENCOUNTER — Encounter (HOSPITAL_COMMUNITY): Payer: Self-pay

## 2018-06-21 ENCOUNTER — Ambulatory Visit (HOSPITAL_COMMUNITY): Payer: Medicare HMO | Attending: Family Medicine

## 2018-06-21 DIAGNOSIS — R42 Dizziness and giddiness: Secondary | ICD-10-CM | POA: Diagnosis not present

## 2018-06-21 DIAGNOSIS — H8111 Benign paroxysmal vertigo, right ear: Secondary | ICD-10-CM | POA: Insufficient documentation

## 2018-06-21 NOTE — Therapy (Signed)
[facility] [address] [place], [place], [address] Phone: [phone]   Fax:  [phone]  Physical Therapy Treatment  Patient Details  Name: Brett [name] MRN: [id number] Date of Birth: [birth year] Referring Provider (PT): Milinda Antis, MD   Encounter Date: 06/21/2018  PT End of Session - 06/21/18 0858    Visit Number  2    Number of Visits  3    Date for PT Re-Evaluation  06/27/18    Authorization Type  [employer] Medicare HMO    Authorization Time Period  06/13/18 to 06/27/18    Authorization - Visit Number  2    Authorization - Number of Visits  10    PT Start Time  0858    PT Stop Time  0923    PT Time Calculation (min)  25 min    Activity Tolerance  Patient tolerated treatment well    Behavior During Therapy  [facility] for tasks assessed/performed       Past Medical History:  Diagnosis Date  . Allergy   . Arthritis   . Cataract   . GERD (gastroesophageal reflux disease)   . History of hiatal hernia   . Hyperlipidemia   . Pneumonia    hx. of  . PONV (postoperative nausea and vomiting)    with gallbladder surgery  . Sleep apnea     Past Surgical History:  Procedure Laterality Date  . CARPAL TUNNEL RELEASE Bilateral   . CHOLECYSTECTOMY  2004  . COLONOSCOPY    . ESOPHAGOGASTRODUODENOSCOPY    . EYE SURGERY     cataract  . FRACTURE SURGERY  2006   fx. wrist  . JOINT REPLACEMENT    . rhino plasty     deviated septum  . SHOULDER SURGERY  2011   rotator cuff  . TOTAL HIP ARTHROPLASTY Right 12/13/2016   Procedure: RIGHT TOTAL HIP ARTHROPLASTY ANTERIOR APPROACH;  Surgeon: Samson Frederic, MD;  Location: [facility] OR;  Service: Orthopedics;  Laterality: Right;  . TOTAL SHOULDER ARTHROPLASTY Right 04/15/2017   Procedure: RIGHT TOTAL SHOULDER ARTHROPLASTY;  Surgeon: Beverely Low, MD;  Location: [facility] OR;  Service: Orthopedics;  Laterality: Right;  Marland Kitchen VASECTOMY  1980    There were no vitals filed for this visit.  Subjective Assessment  - 06/21/18 0858    Subjective  Pt states he has been feeling a little better. Not 100% but a little better. Prior to yesterday he hadn't felt his symptoms a lot. Yesterday he was working on equipment and doing a lot of bending up/down and felt some wooziness. BP has been running about 140/60 something.    Patient Stated Goals  stop eye from twitching and r/o vertigo    Currently in Pain?  No/denies             Vestibular Assessment - 06/21/18 0001      Dix-Hallpike Right   Dix-Hallpike Right Duration  <15seconds    Dix-Hallpike Right Symptoms  No nystagmus   +for symptoms     Vestibular Treatment/Exercise - 06/21/18 0001      Vestibular Treatment/Exercise   Vestibular Treatment Provided  Canalith Repositioning    Canalith Repositioning  Epley Manuever Left   epley maneuver to the L      EPLEY MANUEVER LEFT   Number of Reps   2    Overall Response   Improved Symptoms            PT Education - 06/21/18 0858    Education  Details  self treatment of BPPV to the R    Person(s) Educated  Patient    Methods  Explanation;Handout    Comprehension  Verbalized understanding       PT Short Term Goals - 06/13/18 2039      PT SHORT TERM GOAL #1   Title  Pt will report reduced symptoms of dizziness to 0/10 on a daily basis with head movements in order to demo improved overall symptoms and allow him to perform all ADLs and IADLs without issues.    Time  2    Period  Weeks    Status  New    Target Date  06/27/18      PT SHORT TERM GOAL #2   Title  Pt and wife will verbalize understanding of self-treatment of positional vertigo in order to promote independence with treatment at home and reduce his risk of recurrence.    Time  2    Period  Weeks    Status  New        PT Long Term Goals - 06/13/18 2040      PT LONG TERM GOAL #1   Title  n/a - 2 week POC -- will update as necssary if PT POC needs to be extended            Plan - 06/21/18 0934    Clinical  Impression Statement  Pt returns to therapy with overall improved reports of symptoms since the treatment during his eval last week. 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 Rechecked Dix-Hallpike to the R and pt without nystagmus this date but +for mild symptoms (<15 seconds) so went directly into Epley's maneuver towards the L to treat. Pt with very mild symptoms with head turn to the L and with istting up from left sidelying during testing but each time lasted <15 seconds. After brief rest period, performed [employer] to the R and Epley's to the L again for treatment. No symptoms reported during any of the Dix-Hallpike or Epley's on 2nd round. Educated pt that he could again feel under the weather following today's treatment and he verbalized understanding. Educated pt on how to perform self-treatment at home if he feels any of his symptoms between now and his last f/u visit and he verbalized understanding.     Rehab Potential  Good    PT Frequency  1x / week    PT Duration  2 weeks    PT Treatment/Interventions  ADLs/Self Care Home Management;Canalith Repostioning;Gait training;Functional mobility training;Therapeutic activities;Therapeutic exercise;Balance training;Neuromuscular re-education;Patient/family education;Manual techniques;Dry needling;Vestibular    PT Next Visit Plan  recheck dix-hallpike to the R and treat PRN; potentially check horizontal canal for any involvement and potentially orthostatics    PT Home Exercise Plan  self-treatment of BPPV to the R    Consulted and Agree with Plan of Care  Patient       Patient will benefit from skilled therapeutic intervention in order to improve the following deficits and impairments:  Impaired vision/preception, Dizziness  Visit Diagnosis: BPPV (benign paroxysmal positional vertigo), right  Dizziness and  giddiness     Problem List Patient Active Problem List   Diagnosis Date Noted  . BPV (benign positional vertigo) 05/19/2018  . S/P shoulder replacement, right 04/15/2017  . Osteoarthritis of right hip 12/13/2016  . Essential hypertension 04/10/2014  . Insomnia  due to stress 04/10/2014  . DDD (degenerative disc disease), cervical 04/10/2014  . Glucose intolerance (impaired glucose tolerance) 10/09/2013  . Hypertriglyceridemia 04/13/2013  . GERD (gastroesophageal reflux disease) 04/13/2013  . OSA on CPAP 04/13/2013  . Chronic shoulder pain 04/13/2013        Jac Canavan PT, DPT   [facility] [address] [place], [place], [address] Phone: [phone]   Fax:  [phone]  Name: Brett [name] MRN: [id number] Date of Birth: 1946/04/15

## 2018-06-22 ENCOUNTER — Telehealth: Payer: Self-pay | Admitting: *Deleted

## 2018-06-22 NOTE — Telephone Encounter (Signed)
Received call from patient.   Reports that he has been checking his BP at random times for the last week. States that some readings are from the morning as others are following supper. Results are as follows: Blood Pressure Readings  06/15/2018, AM 147/65  06/16/2018, PM 140/71  06/17/2018, PM 139/68  06/18/2018, AM 161/74  06/19/2018, PM 140/63  06/20/2018, PM 141/69  06/21/2018, PM 164/71  06/22/2018, AM 178/79    Reports that he continues the Amlodipine as directed. MD to be made aware.

## 2018-06-22 NOTE — Telephone Encounter (Signed)
Increase norvasc to 10mg  once a day  Get OV in 3 weeks for BP follow up

## 2018-06-23 MED ORDER — AMLODIPINE BESYLATE 10 MG PO TABS: 10.0000 mg | ORAL_TABLET | Freq: Every day | ORAL | Status: DC

## 2018-06-23 NOTE — Telephone Encounter (Signed)
Call placed to patient and patient made aware.   Appointment scheduled.  

## 2018-06-29 ENCOUNTER — Encounter (HOSPITAL_COMMUNITY): Payer: Self-pay

## 2018-06-29 ENCOUNTER — Ambulatory Visit (HOSPITAL_COMMUNITY): Payer: Medicare HMO

## 2018-06-29 DIAGNOSIS — R42 Dizziness and giddiness: Secondary | ICD-10-CM

## 2018-06-29 DIAGNOSIS — H8111 Benign paroxysmal vertigo, right ear: Secondary | ICD-10-CM | POA: Diagnosis not present

## 2018-06-29 NOTE — Therapy (Signed)
[facility] [place], [place], [address] Phone: [phone]   Fax:  [phone]  Physical Therapy Treatment  Patient Details  Name: Brett [name] MRN: [id number] Date of Birth: [birth year] Referring Provider (PT): Vic Blackbird, MD   Encounter Date: 06/29/2018  PT End of Session - 06/29/18 0904    Visit Number  3    Number of Visits  3    Date for PT Re-Evaluation  06/27/18    Authorization Type  [employer] Medicare HMO    Authorization Time Period  06/13/18 to 06/27/18    Authorization - Visit Number  3    Authorization - Number of Visits  10    PT Start Time  0901    PT Stop Time  0919    PT Time Calculation (min)  18 min    Activity Tolerance  Patient tolerated treatment well    Behavior During Therapy  [facility] for tasks assessed/performed       Past Medical History:  Diagnosis Date  . Allergy   . Arthritis   . Cataract   . GERD (gastroesophageal reflux disease)   . History of hiatal hernia   . Hyperlipidemia   . Pneumonia    hx. of  . PONV (postoperative nausea and vomiting)    with gallbladder surgery  . Sleep apnea     Past Surgical History:  Procedure Laterality Date  . CARPAL TUNNEL RELEASE Bilateral   . CHOLECYSTECTOMY  2004  . COLONOSCOPY    . ESOPHAGOGASTRODUODENOSCOPY    . EYE SURGERY     cataract  . FRACTURE SURGERY  2006   fx. wrist  . JOINT REPLACEMENT    . rhino plasty     deviated septum  . SHOULDER SURGERY  2011   rotator cuff  . TOTAL HIP ARTHROPLASTY Right 12/13/2016   Procedure: RIGHT TOTAL HIP ARTHROPLASTY ANTERIOR APPROACH;  Surgeon: Rod Can, MD;  Location: [facility];  Service: Orthopedics;  Laterality: Right;  . TOTAL SHOULDER ARTHROPLASTY Right 04/15/2017   Procedure: RIGHT TOTAL SHOULDER ARTHROPLASTY;  Surgeon: Netta Cedars, MD;  Location: [facility];  Service: Orthopedics;  Laterality: Right;  Marland Kitchen VASECTOMY  1980    There were no vitals filed for this visit.  Subjective Assessment  - 06/29/18 0904    Subjective  Pt states that he has been fine for the last week until yesterday. He states that his MD doubled his BP medication due to his high readings. They think his symptoms yesterday could be due to his BP.     Patient Stated Goals  stop eye from twitching and r/o vertigo    Currently in Pain?  No/denies           Vestibular Assessment - 06/29/18 0001      Positional Testing   Dix-Hallpike  Dix-Hallpike Right;Dix-Hallpike Left    Horizontal Canal Testing  Horizontal Canal Right;Horizontal Canal Left      Dix-Hallpike Right   Dix-Hallpike Right Duration  no symptoms     Dix-Hallpike Right Symptoms  No nystagmus      Dix-Hallpike Left   Dix-Hallpike Left Duration  no symptoms    Dix-Hallpike Left Symptoms  No nystagmus      Horizontal Canal Right   Horizontal Canal Right Duration  no symptoms      Horizontal Canal Left   Horizontal Canal Left Duration  no symptoms            PT Education - 06/29/18  04/10/2014  . Insomnia due to stress 04/10/2014  . DDD (degenerative disc disease), cervical 04/10/2014  . Glucose intolerance (impaired glucose tolerance) 10/09/2013  . Hypertriglyceridemia 04/13/2013  . GERD (gastroesophageal reflux disease) 04/13/2013  . OSA on CPAP 04/13/2013  . Chronic shoulder pain 04/13/2013       Geraldine Solar PT, DPT  [facility] [address] [place], [place], [address] Phone: [phone]   Fax:  [phone]  Name: Brett [name] MRN: [id number] Date of Birth: [birth year]  04/10/2014  . Insomnia due to stress 04/10/2014  . DDD (degenerative disc disease), cervical 04/10/2014  . Glucose intolerance (impaired glucose tolerance) 10/09/2013  . Hypertriglyceridemia 04/13/2013  . GERD (gastroesophageal reflux disease) 04/13/2013  . OSA on CPAP 04/13/2013  . Chronic shoulder pain 04/13/2013       Geraldine Solar PT, DPT  [facility] [address] [place], [place], [address] Phone: [phone]   Fax:  [phone]  Name: Brett [name] MRN: [id number] Date of Birth: [birth year]

## 2018-06-30 NOTE — Addendum Note (Signed)
Addended by: Geralyn Corwin on: 06/30/2018 10:59 AM   Modules accepted: Orders

## 2018-07-04 ENCOUNTER — Ambulatory Visit (INDEPENDENT_AMBULATORY_CARE_PROVIDER_SITE_OTHER): Payer: Medicare HMO | Admitting: Neurology

## 2018-07-04 ENCOUNTER — Encounter: Payer: Self-pay | Admitting: Neurology

## 2018-07-04 VITALS — BP 152/77 | HR 60 | Ht 68.0 in | Wt 188.0 lb

## 2018-07-04 DIAGNOSIS — R42 Dizziness and giddiness: Secondary | ICD-10-CM

## 2018-07-04 DIAGNOSIS — E663 Overweight: Secondary | ICD-10-CM | POA: Diagnosis not present

## 2018-07-04 DIAGNOSIS — G4733 Obstructive sleep apnea (adult) (pediatric): Secondary | ICD-10-CM

## 2018-07-04 NOTE — Progress Notes (Signed)
Subjective:    Patient ID: Brett Brett [name] is a 73 y.o. male.  HPI     Brett Age, MD, PhD [facility] [address] [place], [place] [address]  Dear Dr. Buelah [name],    I saw your patient, Brett Brett [name], upon your kind request in my neurologic clinic today for initial consultation of his obstructive sleep apnea, in particular for reevaluation. The patient is accompanied by his wife today. As you know, Brett Brett [name] is a 74 year old right-handed gentleman with an underlying medical history of reflux disease, hyperlipidemia, low back pain, arthritis, and overweight state, who was previously diagnosed with obstructive sleep apnea and placed on CPAP therapy. Prior sleep study results are not available for my review today. I have previously seen him once for dizziness/vertigo. I suggested a referral to physical therapy for vestibular rehabilitation at the time.   A CPAP download was not available for my review today. He has an older machine. He reports using his CPAP every night. He feels that he sleeps well with it. He uses nasal pillows and his DME company is [employer].   I reviewed your office note from 05/19/2018. His Epworth sleepiness score is 3 out of 24 today, fatigue score is 18 out of 63. He believes sleep study testing was in or around 2006. [place]. He still has the original CPAP machine. He lives with his wife, he is retired, they have 2 children. He has a farm. They have 1 dog in the household, she sleeps in the bedroom but in her own bed. He does not watch TV in the bedroom. Bedtime is around 9:30 or 10 and rise time somewhere between 5 and 7:30 typically. He does not have night to night nocturia. He is not keen on repeating a sleep study. He would like to see about getting a new CPAP machine through the [place].  Previously:   10/21/2016: (He) reports episodic dizziness and vertiginous spells since beginning of May. He has  also had some blurry vision recently. I reviewed your office note from 10/18/2016. I also reviewed the emergency room records from 09/22/2016. He has been on meclizine. He had a brain MRI without contrast on 09/22/2016 which I reviewed: IMPRESSION: 1. No acute finding.  Negative for posterior circulation infarct. 2. Mild opacification of left mastoid air cells. 3. Mild chronic small vessel ischemia. He feels that his symptoms started in the beginning of May. He recalls that he fell out of bed on 09/14/2016 as he was suddenly dizzy. Since then he has had intermittent brief episodes of dizzy spells and vertigo type symptoms that last for a few minutes at a time, not days or hours typically. He has not found meclizine to be helpful. He recently restarted it and currently takes 25 mg twice daily but does not find it helpful. Of note, he has a history of sleep apnea and uses a CPAP machine he had an eye exam about a year ago, he may be due for another checkup. He has intermittent wax buildup and sometimes tries to clean it with a bulb syringe. He was found to have wax buildup in your office the other day but was advised that trying to clean this could exacerbate his vertigo. He does not report any tinnitus or hearing loss. He tries to get enough sleep about 7-8 hours. He tries to hydrate well with water. He quit smoking in 2004, he does not drink alcohol, he  drinks caffeine, usually 3 cups of coffee per day, typically no sodas or tea. He is married and lives with his wife. He has 2 children. He is retired.  His Past Medical History Is Significant For: Past Medical History:  Diagnosis Date  . Allergy   . Arthritis   . Cataract   . GERD (gastroesophageal reflux disease)   . History of hiatal hernia   . Hyperlipidemia   . Pneumonia    hx. of  . PONV (postoperative nausea and vomiting)    with gallbladder surgery  . Sleep apnea     His Past Surgical History Is Significant For: Past Surgical History:   Procedure Laterality Date  . CARPAL TUNNEL RELEASE Bilateral   . CHOLECYSTECTOMY  2004  . COLONOSCOPY    . ESOPHAGOGASTRODUODENOSCOPY    . EYE SURGERY     cataract  . FRACTURE SURGERY  2006   fx. wrist  . JOINT REPLACEMENT    . rhino plasty     deviated septum  . SHOULDER SURGERY  2011   rotator cuff  . TOTAL HIP ARTHROPLASTY Right 12/13/2016   Procedure: RIGHT TOTAL HIP ARTHROPLASTY ANTERIOR APPROACH;  Surgeon: Rod Can, MD;  Location: [facility];  Service: Orthopedics;  Laterality: Right;  . TOTAL SHOULDER ARTHROPLASTY Right 04/15/2017   Procedure: RIGHT TOTAL SHOULDER ARTHROPLASTY;  Surgeon: Netta Cedars, MD;  Location: [place];  Service: Orthopedics;  Laterality: Right;  Marland Kitchen VASECTOMY  1980    His Family History Is Significant For: Family History  Problem Relation Brett [name] of Onset  . Cancer Sister        uterine   . Cancer Maternal Grandmother     His Social History Is Significant For: Social History   Socioeconomic History  . Marital status: Married    Spouse name: Not on file  . Number of children: Not on file  . Years of education: Not on file  . Highest education level: Not on file  Occupational History  . Not on file  Social Needs  . Financial resource strain: Not on file  . Food insecurity:    Worry: Not on file    Inability: Not on file  . Transportation needs:    Medical: Not on file    Non-medical: Not on file  Tobacco Use  . Smoking status: Former Smoker    Last attempt to quit: 05/23/2010    Years since quitting: 8.1  . Smokeless tobacco: Never Used  Substance and Sexual Activity  . Alcohol use: No  . Drug use: No  . Sexual activity: Yes  Lifestyle  . Physical activity:    Days per week: Not on file    Minutes per session: Not on file  . Stress: Not on file  Relationships  . Social connections:    Talks on phone: Not on file    Gets together: Not on file    Attends religious service: Not on file    Active member of club or organization: Not  on file    Attends meetings of clubs or organizations: Not on file    Relationship status: Not on file  Other Topics Concern  . Not on file  Social History Narrative  . Not on file    His Allergies Are:  Allergies  Allergen Reactions  . Tramadol Nausea Only  :   His Current Medications Are:  Outpatient Encounter Medications as of 07/04/2018  Medication Sig  . amLODipine (NORVASC) 10 MG tablet Take 1 tablet (10  mg total) by mouth daily. (Patient taking differently: Take 20 mg by mouth daily. )  . cetirizine (ZYRTEC) 10 MG tablet Take 10 mg by mouth daily.  . colestipol (COLESTID) 1 g tablet TAKE 1 TABLET TWICE A DAY  . diclofenac (VOLTAREN) 75 MG EC tablet Take 1 tablet (75 mg total) by mouth 2 (two) times daily.  . fenofibrate (TRICOR) 48 MG tablet TAKE 1 TABLET DAILY  . fluticasone (FLONASE) 50 MCG/ACT nasal spray Place 2 sprays into both nostrils daily.  Marland Kitchen omeprazole (PRILOSEC) 20 MG capsule TAKE 1 CAPSULE DAILY  . tamsulosin (FLOMAX) 0.4 MG CAPS capsule TAKE 1 CAPSULE DAILY  . [DISCONTINUED] zolpidem (AMBIEN) 10 MG tablet Take 1 tablet (10 mg total) by mouth at bedtime as needed for sleep.   No facility-administered encounter medications on file as of 07/04/2018.   :  Review of Systems:  Out of a complete 14 point review of systems, all are reviewed and negative with the exception of these symptoms as listed below: Review of Systems  Neurological:       Pt presents today to discuss his cpap. Pt has been using a cpap since about 2006 when he had his first sleep study. Pt has a DME but does not remember the name. Pt wants to discuss getting a new sleep study and a new cpap.  Epworth Sleepiness Scale 0= would never doze 1= slight chance of dozing 2= moderate chance of dozing 3= high chance of dozing  Sitting and reading: 1 Watching TV: 1 Sitting inactive in a public place (ex. Theater or meeting): 0 As a passenger in a car for an hour without a break: 0 Lying down to rest  in the afternoon: 1 Sitting and talking to someone: 0 Sitting quietly after lunch (no alcohol): 0 In a car, while stopped in traffic: 0 Total: 3     Objective:  Neurological Exam  Physical Exam Physical Examination:   Vitals:   07/04/18 1101  BP: (!) 152/77  Pulse: 60    General Examination: The patient is a very pleasant 73 y.o. male in no acute distress. He appears well-developed and well-nourished and well groomed.   HEENT: Normocephalic, atraumatic, pupils are equal, round and reactive to light and accommodation. Extraocular tracking is good. Hearing is grossly intact. Face is symmetric with normal facial animation and normal facial sensation. Speech is clear with no dysarthria noted. There is no hypophonia. There is no lip, neck/head, jaw or voice tremor. There are no carotid bruits on auscultation. Oropharynx exam reveals: mild mouth dryness, adequate dental hygiene and mild airway crowding, due to smaller airway entry and redundant soft palate, longer uvula noted. Tonsils are absent. Mallampati is class II. Neck circumference is 17-3/4 inches.tongue protrudes centrally and palate elevates symmetrically.  Chest: Clear to auscultation without wheezing, rhonchi or crackles noted.  Heart: S1+S2+0, regular and normal without murmurs, rubs or gallops noted.   Abdomen: Soft, non-tender and non-distended with normal bowel sounds appreciated on auscultation.  Extremities: There is no pitting edema in the distal lower extremities bilaterally. Pedal pulses are intact.  Skin: Warm and dry without trophic changes noted.  Musculoskeletal: exam reveals no obvious joint deformities, tenderness or joint swelling or erythema.   Neurologically:  Mental status: The patient is awake, alert and oriented in all 4 spheres. His immediate and remote memory, attention, language skills and fund of knowledge are appropriate. There is no evidence of aphasia, agnosia, apraxia or anomia. Speech is clear  with normal prosody  and enunciation. Thought process is linear. Mood is normal and affect is normal.  Cranial nerves II - XII are as described above under HEENT exam. In addition: shoulder shrug is normal with equal shoulder height noted. Motor exam: Normal bulk, strength and tone is noted. There is no drift, tremor or rebound. Romberg is negative. Fine motor skills and coordination: grossly intact.  Cerebellar testing: No dysmetria or intention tremor on finger to nose testing. Heel to shin is unremarkable bilaterally. There is no truncal or gait ataxia.  Sensory exam: intact to light touch in the upper and lower extremities.  Gait, station and balance: He stands easily. No veering to one side is noted. No leaning to one side is noted. Posture is Brett [name]-appropriate and stance is narrow based. Gait shows normal stride length and normal pace. No problems turning are noted. Tandem walk is unremarkable.   Assessment and Plan:  In summary, CAILLOU MINUS is a very pleasant 73 y.o.-year old male with an underlying medical history of reflux disease, hyperlipidemia, low back pain, arthritis, vertigo, and overweight state, who presents for evaluation of his prior diagnosis of sleep apnea. He was diagnosed with what sounds like severe sleep apnea in 2006 or thereabouts. He has been on CPAP therapy. He would qualify for new machine but will need a new sleep study for this. He is reluctant to proceed with a sleep study but is agreeable to trying to get authorization and scheduling a sleep study sometime in March. He would like to see about getting a new set of equipment through the [place] if possible. He will make some requirements there. He is advised to call us when he is ready to proceed with sleep study testing, we will call from our and once we have insurance authorization and are ready to proceed. I will see him back after his sleep study is completed. I answered all their questions today and the patient and his wife  were in agreement. Thank you very much for allowing me to participate in the care of this nice patient. If I can be of any further assistance to you please do not hesitate to call me at [phone].  Sincerely,   Brett Age, MD, PhD

## 2018-07-04 NOTE — Patient Instructions (Signed)

## 2018-07-05 DIAGNOSIS — H34832 Tributary (branch) retinal vein occlusion, left eye, with macular edema: Secondary | ICD-10-CM | POA: Diagnosis not present

## 2018-07-05 DIAGNOSIS — H43813 Vitreous degeneration, bilateral: Secondary | ICD-10-CM | POA: Diagnosis not present

## 2018-07-06 ENCOUNTER — Telehealth: Payer: Self-pay | Admitting: *Deleted

## 2018-07-06 ENCOUNTER — Telehealth: Payer: Self-pay

## 2018-07-06 NOTE — Telephone Encounter (Signed)
Received call from patient.   Requested consult order for [facility]. States that if order is placed as a transferring patient, he will not require a new sleep study for CPAP.   Call placed to patient to inquire. [place].

## 2018-07-06 NOTE — Telephone Encounter (Signed)
Called pt to schedule HST with [place]. Pt stated he is waiting to get a CPAP machine through the [place] and should be getting it in march 2020. Pt wants to wait on scheduling at this time.

## 2018-07-06 NOTE — Telephone Encounter (Signed)
Received return call from patient.   States that [facility] [facility] is part of [place]. Advised that we cannot order through the [place] as Clinical cytogeneticist. Patient states that clinic is requesting order.   Requested patient to contact us with information for clinic to have [place] call and verify request.

## 2018-07-17 DIAGNOSIS — S83242D Other tear of medial meniscus, current injury, left knee, subsequent encounter: Secondary | ICD-10-CM | POA: Diagnosis not present

## 2018-07-21 ENCOUNTER — Encounter: Payer: Self-pay | Admitting: Family Medicine

## 2018-07-21 ENCOUNTER — Other Ambulatory Visit: Payer: Self-pay

## 2018-07-21 ENCOUNTER — Ambulatory Visit (INDEPENDENT_AMBULATORY_CARE_PROVIDER_SITE_OTHER): Payer: Medicare HMO | Admitting: Family Medicine

## 2018-07-21 VITALS — BP 146/72 | HR 58 | Temp 97.9°F | Resp 14 | Ht 68.0 in | Wt 194.0 lb

## 2018-07-21 DIAGNOSIS — G4733 Obstructive sleep apnea (adult) (pediatric): Secondary | ICD-10-CM | POA: Diagnosis not present

## 2018-07-21 DIAGNOSIS — I10 Essential (primary) hypertension: Secondary | ICD-10-CM | POA: Diagnosis not present

## 2018-07-21 DIAGNOSIS — M23204 Derangement of unspecified medial meniscus due to old tear or injury, left knee: Secondary | ICD-10-CM | POA: Diagnosis not present

## 2018-07-21 DIAGNOSIS — Z9989 Dependence on other enabling machines and devices: Secondary | ICD-10-CM

## 2018-07-21 MED ORDER — LOSARTAN POTASSIUM 25 MG PO TABS: 25.0000 mg | ORAL_TABLET | Freq: Every day | ORAL | 2 refills | Status: DC

## 2018-07-21 NOTE — Assessment & Plan Note (Signed)
[facility] and [facility] Given copy of neurology note so he can get new CPAP

## 2018-07-21 NOTE — Patient Instructions (Addendum)
F/U 2 weeks in office Call me in 1 week with readings Take 1/2 tablet of the losartan Continue amlodipine

## 2018-07-21 NOTE — Progress Notes (Signed)
Subjective:    Patient ID: Brett [name], male    DOB: Oct 08, 1945, 73 y.o.   MRN: [id number]  Patient presents for F/U BP and Surgical Clearance (Left Knee)  Patient here to follow-up hypertension.  Back in January he was seen at the [place] he had significantly elevated blood pressure at that time he was started on amlodipine 5 mg [facility].  He called in with his readings at home I increased his amlodipine to 10 mg once a day.  He is here for recheck   BP- at home 140-160's/60-70  No HA, CHEST PAIN, SOB    Due to have Left knee arthroscopy due to knee pain and medical meniscus , last seen on Monday 3/2 he is not on any blood thinners, he is not diabetic, no asthma COPD medications        Review Of Systems:  GEN- denies fatigue, fever, weight loss,weakness, recent illness HEENT- denies eye drainage, change in vision, nasal discharge, CVS- denies chest pain, palpitations RESP- denies SOB, cough, wheeze ABD- denies N/V, change in stools, abd pain GU- denies dysuria, hematuria, dribbling, incontinence MSK- + joint pain, muscle aches, injury Neuro- denies headache, dizziness, syncope, seizure activity       Objective:    BP (!) 146/72 (BP Location: Right Arm, Patient Position: Sitting, Cuff Size: Large)   Pulse (!) 58   Temp 97.9 F (36.6 C) (Oral)   Resp 14   Ht 5\' 8"  (1.727 m)   Wt 194 lb (88 kg)   SpO2 97%   BMI 29.50 kg/m  GEN- NAD, alert and oriented x3 HEENT- PERRL, EOMI, non injected sclera, pink conjunctiva, MMM, oropharynx clear CVS- RRR, no murmur RESP-CTAB EXT- No edema Pulses- Radial 2+        Assessment & Plan:      Problem List Items Addressed This Visit      Unprioritized   Essential hypertension - Primary    Add losartan 12.5mg  to norvasc 10mg  Avoiding rate controlling meds has baseline bradycardia asymptomatic Also with BPH , so avoid duiretics  Will check BP at home in 1 week and call, if still elevated, increase losartan to 25mg  F/u in office  in 2 weeks for BP, if improved < 140/90 will clear for surgery       Relevant Medications   losartan (COZAAR) 25 MG tablet   OSA on CPAP    [facility] and [facility] Given copy of neurology note so he can get new CPAP        Other Visit Diagnoses    Degenerative tear of left medial meniscus       Will clear once BP is controlled, recent CXR, EKG, Labs unremarkable      Note: This dictation was prepared with Dragon dictation along with smaller phrase technology. Any transcriptional errors that result from this process are unintentional.

## 2018-07-21 NOTE — Assessment & Plan Note (Addendum)
Add losartan 12.5mg  to norvasc 10mg  Avoiding rate controlling meds has baseline bradycardia asymptomatic Also with BPH , so avoid duiretics  Will check BP at home in 1 week and call, if still elevated, increase losartan to 25mg  F/u in office in 2 weeks for BP, if improved < 140/90 will clear for surgery

## 2018-07-25 ENCOUNTER — Other Ambulatory Visit: Payer: Self-pay | Admitting: *Deleted

## 2018-07-25 ENCOUNTER — Telehealth (HOSPITAL_COMMUNITY): Payer: Self-pay | Admitting: Family Medicine

## 2018-07-25 MED ORDER — AMLODIPINE BESYLATE 10 MG PO TABS: 10.0000 mg | ORAL_TABLET | Freq: Every day | ORAL | 3 refills | Status: DC

## 2018-07-25 NOTE — Telephone Encounter (Signed)
07/25/18  Pt cx appt for 3/12 said he didn't need it

## 2018-07-27 ENCOUNTER — Ambulatory Visit (HOSPITAL_COMMUNITY): Payer: Medicare HMO

## 2018-07-31 ENCOUNTER — Telehealth: Payer: Self-pay | Admitting: *Deleted

## 2018-07-31 NOTE — Telephone Encounter (Signed)
Received call from patient.   Reports that he has been monitoring his BP since starting Losartan 12.5mg .  Reports that average BP remains >140. Per chart notes, if BP remains elevated, increase Losartan to 25mg .   Advised to increase Losartan and to F/U in (1) week with readings.

## 2018-07-31 NOTE — Telephone Encounter (Signed)
Agree, with 25mg  once a day

## 2018-08-02 ENCOUNTER — Encounter (HOSPITAL_COMMUNITY): Payer: Self-pay

## 2018-08-02 NOTE — Therapy (Signed)
[place], [place], [address] Phone: [phone]   Fax:  [phone]  Patient Details  Name: Brett [name] MRN: [id number] Date of Birth: 1945-08-19 Referring Provider:  No ref. provider found  Encounter Date: 08/02/2018  PHYSICAL THERAPY DISCHARGE SUMMARY  Visits from Start of Care: 3  Current functional level related to goals / functional outcomes: See last note   Remaining deficits: See last note   Education / Equipment: n/a  Plan: Patient agrees to discharge.  Patient goals were not met. Patient is being discharged due to being pleased with the current functional level.  ?????    Geraldine Solar PT, [facility] [address] [place], [place], [address] Phone: [phone]   Fax:  [phone]

## 2018-08-08 ENCOUNTER — Ambulatory Visit (INDEPENDENT_AMBULATORY_CARE_PROVIDER_SITE_OTHER): Payer: Medicare HMO | Admitting: Family Medicine

## 2018-08-08 ENCOUNTER — Other Ambulatory Visit: Payer: Self-pay

## 2018-08-08 ENCOUNTER — Encounter: Payer: Self-pay | Admitting: Family Medicine

## 2018-08-08 DIAGNOSIS — H8110 Benign paroxysmal vertigo, unspecified ear: Secondary | ICD-10-CM

## 2018-08-08 DIAGNOSIS — I10 Essential (primary) hypertension: Secondary | ICD-10-CM | POA: Diagnosis not present

## 2018-08-08 MED ORDER — LOSARTAN POTASSIUM 25 MG PO TABS: 25.0000 mg | ORAL_TABLET | Freq: Every day | ORAL | 2 refills | Status: DC

## 2018-08-08 MED ORDER — AMLODIPINE BESYLATE 10 MG PO TABS: 10.0000 mg | ORAL_TABLET | Freq: Every day | ORAL | 3 refills | Status: DC

## 2018-08-08 NOTE — Progress Notes (Signed)
Virtual Visit via Telephone Note  I connected with Brett [name] on 08/08/18 at  8:25 AM EDT by telephone and verified that I am speaking with the correct person using two identifiers.   Pt Location: [facility] Location: Vic Blackbird MD, [facility]  Both patient and wife Brett [name] on phone call   I discussed the limitations, risks, security and privacy concerns of performing an evaluation and management service by telephone and the availability of in person appointments. I also discussed with the patient that there may be a patient responsible charge related to this service. The patient expressed understanding and agreed to proceed.   History of Present Illness:   HTN- pt seen on 3/6, losartan 12.5mg  added to his norvasc due to elevated BP, this was then increased to 25mg  on 3/16     131/64 P 61 ( this AM) 149/68  (3/23)   Other random bp readings 116/59  (evening)  117/62  (evening)  152/64 (morning)  132/63 (evening) 128/62 (evening)  Taking norvac at bedtime, losartan in the morning Went bent over gets wobbly, no SOB, Chest pain , has also underlying vertigo Feels well otherwise  Needs medical clearance of BP for his knee surgery    [facility] scripts for BP medications     Assessment and Plan: HTN-blood pressure readings overall look great especially for his age.  The dizziness is difficult mostly is positional which he has known positional vertigo was getting rehab but now that we are in quarantine is unable to do so.  Unfortunately the meclizine medications do not work very well for him.  I do not want to push his blood pressure down any lower it seems by the afternoon and late into the evening his blood pressure is already going down low.  I am to have him take both medications at bedtime and see how he does he will record his readings and call me in 1 week.  He is cleared for his surgery whenever they are able to do elective surgeries again.      I  discussed the assessment and treatment plan with the patient. The patient was provided an opportunity to ask questions and all were answered. The patient agreed with the plan and demonstrated an understanding of the instructions.   The patient was advised to call back or seek an in-person evaluation if the symptoms worsen or if the condition fails to improve as anticipated.  I provided 11  minutes of non-face-to-face time during this encounter.   Vic Blackbird, MD

## 2018-08-14 DIAGNOSIS — G4733 Obstructive sleep apnea (adult) (pediatric): Secondary | ICD-10-CM | POA: Diagnosis not present

## 2018-08-16 DIAGNOSIS — H34832 Tributary (branch) retinal vein occlusion, left eye, with macular edema: Secondary | ICD-10-CM | POA: Diagnosis not present

## 2018-08-23 ENCOUNTER — Telehealth: Payer: Self-pay | Admitting: *Deleted

## 2018-08-23 NOTE — Telephone Encounter (Signed)
No changes to meds at this time Average BP looks good

## 2018-08-23 NOTE — Telephone Encounter (Signed)
Call placed to patient and patient made aware.  

## 2018-08-23 NOTE — Telephone Encounter (Signed)
Received call from patient. [phone]~ telephone.   Reports that he has been recording BP as requested. His readings are as follows: Date  BP  3/29 159/68  3/31 124/60  4/2 125/62  4/4 140/65  4/5 126/61  4/6 131/65  4/7 143/67   Patient continues Losartan 25mg  PO QHS and Amlodipine 10mg  PO QHS.   MD to be made aware.

## 2018-08-24 DIAGNOSIS — H6123 Impacted cerumen, bilateral: Secondary | ICD-10-CM | POA: Diagnosis not present

## 2018-09-27 DIAGNOSIS — H34832 Tributary (branch) retinal vein occlusion, left eye, with macular edema: Secondary | ICD-10-CM | POA: Diagnosis not present

## 2018-10-12 ENCOUNTER — Other Ambulatory Visit: Payer: Self-pay | Admitting: Family Medicine

## 2018-10-18 ENCOUNTER — Other Ambulatory Visit: Payer: Self-pay

## 2018-10-18 ENCOUNTER — Ambulatory Visit (INDEPENDENT_AMBULATORY_CARE_PROVIDER_SITE_OTHER): Payer: Medicare HMO

## 2018-10-18 ENCOUNTER — Encounter: Payer: Self-pay | Admitting: Podiatry

## 2018-10-18 ENCOUNTER — Ambulatory Visit (INDEPENDENT_AMBULATORY_CARE_PROVIDER_SITE_OTHER): Payer: Medicare HMO | Admitting: Podiatry

## 2018-10-18 ENCOUNTER — Other Ambulatory Visit: Payer: Self-pay | Admitting: Podiatry

## 2018-10-18 VITALS — BP 142/67 | HR 55 | Temp 97.7°F | Resp 16

## 2018-10-18 DIAGNOSIS — M79672 Pain in left foot: Secondary | ICD-10-CM

## 2018-10-18 DIAGNOSIS — M722 Plantar fascial fibromatosis: Secondary | ICD-10-CM

## 2018-10-18 MED ORDER — TRIAMCINOLONE ACETONIDE 10 MG/ML IJ SUSP
10.0000 mg | Freq: Once | INTRAMUSCULAR | Status: AC
  Administered 2018-10-18: 10 mg

## 2018-10-18 NOTE — Progress Notes (Signed)
Subjective:   Patient ID: Brett [name], male   DOB: 73 y.o.   MRN: [id number]   HPI Patient presents stating that he is had a lot of pain in his left heel for the last month and is gradually gotten worse and at times it is quite intense and at other times not as bad.  Does not remember specific injury or history of this and patient does not smoke and likes to be active   Review of Systems  All other systems reviewed and are negative.       Objective:  Physical Exam Vitals signs and nursing note reviewed.  Constitutional:      Appearance: He is well-developed.  Pulmonary:     Effort: Pulmonary effort is normal.  Musculoskeletal: Normal range of motion.  Skin:    General: Skin is warm.  Neurological:     Mental Status: He is alert.     Neurovascular status found to be intact muscle strength is adequate range of motion within normal limits.  Patient is noted to have exquisite discomfort medial fascial band left at the insertional point tendon into the calcaneus with inflammation fluid around the medial band and is noted to have good digital perfusion and well oriented x3     Assessment:  Acute plantar fasciitis left with inflammation fluid of the medial band     Plan:  H&P condition reviewed and at this time went ahead did sterile prep and injected the medial fascial band 3 mg Kenalog 5 mg Xylocaine advised on physical therapy anti-inflammatory shoe gear modification and placed in fascial brace.  Reappoint for [place] to recheck in the next several weeks  X-rays indicate that there is spur formation in the plantar heel at the insertional point of the tendon into the calcaneus with no indications of stress fracture

## 2018-10-18 NOTE — Progress Notes (Signed)
Subjective:    Patient ID: Brett [name], male    DOB: 08/25/1945, 73 y.o.   MRN: [id number]  HPI    Review of Systems  All other systems reviewed and are negative.      Objective:   Physical Exam        Assessment & Plan:

## 2018-10-18 NOTE — Patient Instructions (Signed)

## 2018-11-01 ENCOUNTER — Other Ambulatory Visit: Payer: Self-pay

## 2018-11-01 ENCOUNTER — Encounter: Payer: Self-pay | Admitting: Podiatry

## 2018-11-01 ENCOUNTER — Ambulatory Visit (INDEPENDENT_AMBULATORY_CARE_PROVIDER_SITE_OTHER): Payer: Medicare HMO | Admitting: Podiatry

## 2018-11-01 VITALS — Temp 96.9°F

## 2018-11-01 DIAGNOSIS — M722 Plantar fascial fibromatosis: Secondary | ICD-10-CM | POA: Diagnosis not present

## 2018-11-06 DIAGNOSIS — H66003 Acute suppurative otitis media without spontaneous rupture of ear drum, bilateral: Secondary | ICD-10-CM | POA: Diagnosis not present

## 2018-11-06 NOTE — Progress Notes (Signed)
Subjective:   Patient ID: Brett [name], male   DOB: 73 y.o.   MRN: [id number]   HPI Patient states that the foot is feeling quite a bit better and the injection helped there is still an area pain but overall improved   ROS      Objective:  Physical Exam  Patient presents with pain in the heel that has improved but is still present with an area of exquisite discomfort and also on the lateral side of the foot pain with palpation     Assessment:  Fasciitis-like symptoms along with lateral tendinitis     Plan:  H&P condition reviewed sterile prep applied to both areas injected the plantar fascia 3 mg Kenalog 5 mg Xylocaine and the lateral tendon 3 mg Kenalog 5 mg Xylocaine along with the recommendation of supportive shoes and dispensed over-the-counter insole.  Patient will be seen back to recheck

## 2018-11-10 DIAGNOSIS — H34832 Tributary (branch) retinal vein occlusion, left eye, with macular edema: Secondary | ICD-10-CM | POA: Diagnosis not present

## 2018-11-24 DIAGNOSIS — Z96641 Presence of right artificial hip joint: Secondary | ICD-10-CM | POA: Diagnosis not present

## 2018-11-24 DIAGNOSIS — M7061 Trochanteric bursitis, right hip: Secondary | ICD-10-CM | POA: Diagnosis not present

## 2018-11-27 ENCOUNTER — Ambulatory Visit: Payer: Medicare HMO | Admitting: Family Medicine

## 2018-11-28 ENCOUNTER — Encounter: Payer: Self-pay | Admitting: Family Medicine

## 2018-11-28 ENCOUNTER — Other Ambulatory Visit: Payer: Self-pay

## 2018-11-28 ENCOUNTER — Ambulatory Visit (INDEPENDENT_AMBULATORY_CARE_PROVIDER_SITE_OTHER): Payer: Medicare HMO | Admitting: Family Medicine

## 2018-11-28 VITALS — BP 132/74 | HR 60 | Temp 98.3°F | Resp 16 | Ht 68.0 in | Wt 184.0 lb

## 2018-11-28 DIAGNOSIS — I10 Essential (primary) hypertension: Secondary | ICD-10-CM

## 2018-11-28 DIAGNOSIS — R7302 Impaired glucose tolerance (oral): Secondary | ICD-10-CM

## 2018-11-28 DIAGNOSIS — E782 Mixed hyperlipidemia: Secondary | ICD-10-CM

## 2018-11-28 DIAGNOSIS — H65492 Other chronic nonsuppurative otitis media, left ear: Secondary | ICD-10-CM

## 2018-11-28 NOTE — Assessment & Plan Note (Signed)
A1C 5.7%, continues to monitor diet

## 2018-11-28 NOTE — Progress Notes (Signed)
Subjective:    Patient ID: Brett [name], male    DOB: 1945-06-07, 73 y.o.   MRN: [id number]  Patient presents for Follow-up (is fsating) Patient here to follow-up chronic medical problems.  He had a telephone visit back in March for his blood pressure.  He is currently on losartan 25 mg once a day in addition to his amlodipine blood pressures at home have been  Is also status post recent knee surgery- August 6th   Hypertriglyceridemia he is still on the colestipol and TriCor 48 mg once a day.  He had recent labs done at [place], reviewed - LDL 143, TG  128   ( Improved LDL from Jan was 161) BPH he is continued on Flomax without any difficulty  Left ear- tube in place he thinks was already pushing out, cant hear as well out of left ear, seen by ENT 1 week ago  No further shots in eye  Sept  He has bursitis in right hip- taking aleve , already seen by ortho   Glucose intolerance A1C was 5.7% at [facility]      Review Of Systems:  GEN- denies fatigue, fever, weight loss,weakness, recent illness HEENT- denies eye drainage, change in vision, nasal discharge, CVS- denies chest pain, palpitations RESP- denies SOB, cough, wheeze ABD- denies N/V, change in stools, abd pain GU- denies dysuria, hematuria, dribbling, incontinence MSK-+ joint pain, muscle aches, injury Neuro- denies headache, dizziness, syncope, seizure activity       Objective:    BP 132/74   Pulse 60   Temp 98.3 F (36.8 C) (Oral)   Resp 16   Ht 5\' 8"  (1.727 m)   Wt 184 lb (83.5 kg)   SpO2 96%   BMI 27.98 kg/m  GEN- NAD, alert and oriented x3 HEENT- PERRL, EOMI, non injected sclera, pink conjunctiva, MMM, oropharynx clear, scarring bilat TM L >R no tube found, decreased light reflex left TM, canal clear  Neck- Supple, no thyromegaly CVS- RRR, no murmur RESP-CTAB EXT- No edema Pulses- Radial, DP- 2+        Assessment & Plan:      Problem List Items Addressed This Visit      Unprioritized   Essential  hypertension - Primary    Blood pressure is well controlled no change in medication. Renal function normal      Glucose intolerance (impaired glucose tolerance)    A1C 5.7%, continues to monitor diet       Mixed hyperlipidemia    Improved hyperlipidemia       Other Visit Diagnoses    Chronic middle ear effusion, left       Tube is out, but still decreased hearing, small amount of fluid, recommend f/u ENT he will schedule      Note: This dictation was prepared with Dragon dictation along with smaller phrase technology. Any transcriptional errors that result from this process are unintentional.

## 2018-11-28 NOTE — Patient Instructions (Signed)
F/U 4 months  

## 2018-11-28 NOTE — Assessment & Plan Note (Signed)
Blood pressure is well controlled no change in medication. Renal function normal

## 2018-11-28 NOTE — Assessment & Plan Note (Signed)
Improved hyperlipidemia

## 2018-12-07 DIAGNOSIS — Z471 Aftercare following joint replacement surgery: Secondary | ICD-10-CM | POA: Diagnosis not present

## 2018-12-07 DIAGNOSIS — Z96611 Presence of right artificial shoulder joint: Secondary | ICD-10-CM | POA: Diagnosis not present

## 2018-12-21 DIAGNOSIS — S83232A Complex tear of medial meniscus, current injury, left knee, initial encounter: Secondary | ICD-10-CM | POA: Diagnosis not present

## 2018-12-21 DIAGNOSIS — M94262 Chondromalacia, left knee: Secondary | ICD-10-CM | POA: Diagnosis not present

## 2018-12-21 DIAGNOSIS — Y999 Unspecified external cause status: Secondary | ICD-10-CM | POA: Diagnosis not present

## 2018-12-29 DIAGNOSIS — M25562 Pain in left knee: Secondary | ICD-10-CM | POA: Diagnosis not present

## 2019-01-02 DIAGNOSIS — L570 Actinic keratosis: Secondary | ICD-10-CM | POA: Diagnosis not present

## 2019-01-02 DIAGNOSIS — D485 Neoplasm of uncertain behavior of skin: Secondary | ICD-10-CM | POA: Diagnosis not present

## 2019-01-02 DIAGNOSIS — D2261 Melanocytic nevi of right upper limb, including shoulder: Secondary | ICD-10-CM | POA: Diagnosis not present

## 2019-01-02 DIAGNOSIS — D0439 Carcinoma in situ of skin of other parts of face: Secondary | ICD-10-CM | POA: Diagnosis not present

## 2019-01-02 DIAGNOSIS — C4492 Squamous cell carcinoma of skin, unspecified: Secondary | ICD-10-CM

## 2019-01-02 HISTORY — DX: Squamous cell carcinoma of skin, unspecified: C44.92

## 2019-01-09 ENCOUNTER — Other Ambulatory Visit: Payer: Self-pay | Admitting: Family Medicine

## 2019-02-12 DIAGNOSIS — H348322 Tributary (branch) retinal vein occlusion, left eye, stable: Secondary | ICD-10-CM | POA: Diagnosis not present

## 2019-02-22 ENCOUNTER — Ambulatory Visit (INDEPENDENT_AMBULATORY_CARE_PROVIDER_SITE_OTHER): Payer: Medicare HMO

## 2019-02-22 ENCOUNTER — Other Ambulatory Visit: Payer: Self-pay

## 2019-02-22 DIAGNOSIS — Z23 Encounter for immunization: Secondary | ICD-10-CM | POA: Diagnosis not present

## 2019-03-30 ENCOUNTER — Other Ambulatory Visit: Payer: Self-pay

## 2019-04-02 ENCOUNTER — Encounter: Payer: Self-pay | Admitting: Family Medicine

## 2019-04-02 ENCOUNTER — Ambulatory Visit (INDEPENDENT_AMBULATORY_CARE_PROVIDER_SITE_OTHER): Payer: Medicare HMO | Admitting: Family Medicine

## 2019-04-02 VITALS — BP 124/78 | HR 60 | Temp 98.6°F | Resp 14 | Ht 68.0 in | Wt 184.0 lb

## 2019-04-02 DIAGNOSIS — E782 Mixed hyperlipidemia: Secondary | ICD-10-CM | POA: Diagnosis not present

## 2019-04-02 DIAGNOSIS — R7302 Impaired glucose tolerance (oral): Secondary | ICD-10-CM | POA: Diagnosis not present

## 2019-04-02 DIAGNOSIS — I10 Essential (primary) hypertension: Secondary | ICD-10-CM | POA: Diagnosis not present

## 2019-04-02 DIAGNOSIS — H9193 Unspecified hearing loss, bilateral: Secondary | ICD-10-CM

## 2019-04-02 MED ORDER — COLESTIPOL HCL 1 G PO TABS: 1.0000 g | ORAL_TABLET | Freq: Two times a day (BID) | ORAL | 4 refills | Status: DC

## 2019-04-02 NOTE — Assessment & Plan Note (Signed)
Controlled no changes, fasting labs obtained, no change to meds

## 2019-04-02 NOTE — Assessment & Plan Note (Signed)
Continue current meds 

## 2019-04-02 NOTE — Progress Notes (Signed)
Subjective:    Patient ID: Brett [name], male    DOB: 1946/01/30, 73 y.o.   MRN: [id number]  Patient presents for Follow-up (is fasting)  Patient here to follow-up chronic medical problems.  Medications reviewed.  Hypertension he is taking his blood pressure medicine as prescribed without any difficulty.  He is currently on amlodipine and losartan , NO SE with medications  [facility]   Hyperlipidemia he still on colestipol and TriCor  Glucose intolerance he had an A1c of 5.7% at the [place] back in July.   Has had plantar fasicitis currently wearing a brace, does not want to return to podiatry at this time   He is still having problems with his left ear, cant hear as well, feels stuffy, has not been back to ENT, tube has come out    [facility]  Review Of Systems:  GEN- denies fatigue, fever, weight loss,weakness, recent illness HEENT- denies eye drainage, change in vision, nasal discharge, CVS- denies chest pain, palpitations RESP- denies SOB, cough, wheeze ABD- denies N/V, change in stools, abd pain GU- denies dysuria, hematuria, dribbling, incontinence MSK- denies joint pain, muscle aches, injury Neuro- denies headache, dizziness, syncope, seizure activity       Objective:    BP 124/78   Pulse 60   Temp 98.6 F (37 C) (Temporal)   Resp 14   Ht 5\' 8"  (1.727 m)   Wt 184 lb (83.5 kg)   SpO2 97%   BMI 27.98 kg/m  GEN- NAD, alert and oriented x3 HEENT- PERRL, EOMI, non injected sclera, pink conjunctiva, MMM, oropharynx clear, scarring left TM, no fluid seen, light reflex seen, canals clear bilat Right TM in tact, no effusion  Neck- Supple, no thyromegaly CVS- RRR, no murmur RESP-CTAB EXT- No edema Pulses- Radial, 2+  Decreased hearing Left ear compared to right, hearing test done in office       Assessment & Plan:      Problem List Items Addressed This Visit      Unprioritized   Essential hypertension - Primary    Controlled no changes,  fasting labs obtained, no change to meds       Relevant Orders   CBC with Differential   Comprehensive metabolic panel   Glucose intolerance (impaired glucose tolerance)    Trying to monitor diet/carb sweets Recheck A1C       Relevant Orders   Hemoglobin A1c   Mixed hyperlipidemia    Continue current meds       Relevant Orders   Lipid Panel    Other Visit Diagnoses    Bilateral hearing loss, unspecified hearing loss type       F/U ENT for ongoing hearing problems, pt will call and schedule      Note: This dictation was prepared with Dragon dictation along with smaller phrase technology. Any transcriptional errors that result from this process are unintentional.

## 2019-04-02 NOTE — Assessment & Plan Note (Signed)
Trying to monitor diet/carb sweets Recheck A1C

## 2019-04-02 NOTE — Patient Instructions (Addendum)
We will call with lab results F/U 4 months for wellness Physical

## 2019-04-03 ENCOUNTER — Encounter (INDEPENDENT_AMBULATORY_CARE_PROVIDER_SITE_OTHER): Payer: Self-pay | Admitting: Otolaryngology

## 2019-04-03 ENCOUNTER — Ambulatory Visit (INDEPENDENT_AMBULATORY_CARE_PROVIDER_SITE_OTHER): Payer: Medicare HMO | Admitting: Otolaryngology

## 2019-04-03 ENCOUNTER — Other Ambulatory Visit: Payer: Self-pay

## 2019-04-03 VITALS — Temp 97.9°F

## 2019-04-03 DIAGNOSIS — H6522 Chronic serous otitis media, left ear: Secondary | ICD-10-CM

## 2019-04-03 LAB — CBC WITH DIFFERENTIAL/PLATELET
Absolute Monocytes: 545 cells/uL (ref 200–950)
Basophils Absolute: 63 cells/uL (ref 0–200)
Basophils Relative: 0.8 %
Eosinophils Absolute: 371 cells/uL (ref 15–500)
Eosinophils Relative: 4.7 %
HCT: 39.9 % (ref 38.5–50.0)
Hemoglobin: 12.8 g/dL — ABNORMAL LOW (ref 13.2–17.1)
Lymphs Abs: 1722 cells/uL (ref 850–3900)
MCH: 26.7 pg — ABNORMAL LOW (ref 27.0–33.0)
MCHC: 32.1 g/dL (ref 32.0–36.0)
MCV: 83.1 fL (ref 80.0–100.0)
MPV: 9.4 fL (ref 7.5–12.5)
Monocytes Relative: 6.9 %
Neutro Abs: 5198 cells/uL (ref 1500–7800)
Neutrophils Relative %: 65.8 %
Platelets: 278 10*3/uL (ref 140–400)
RBC: 4.8 10*6/uL (ref 4.20–5.80)
RDW: 12.4 % (ref 11.0–15.0)
Total Lymphocyte: 21.8 %
WBC: 7.9 10*3/uL (ref 3.8–10.8)

## 2019-04-03 LAB — COMPREHENSIVE METABOLIC PANEL
AG Ratio: 1.9 (calc) (ref 1.0–2.5)
ALT: 11 U/L (ref 9–46)
AST: 12 U/L (ref 10–35)
Albumin: 4.2 g/dL (ref 3.6–5.1)
Alkaline phosphatase (APISO): 41 U/L (ref 35–144)
BUN: 15 mg/dL (ref 7–25)
CO2: 30 mmol/L (ref 20–32)
Calcium: 9.3 mg/dL (ref 8.6–10.3)
Chloride: 103 mmol/L (ref 98–110)
Creat: 0.97 mg/dL (ref 0.70–1.18)
Globulin: 2.2 g/dL (calc) (ref 1.9–3.7)
Glucose, Bld: 104 mg/dL — ABNORMAL HIGH (ref 65–99)
Potassium: 4.6 mmol/L (ref 3.5–5.3)
Sodium: 140 mmol/L (ref 135–146)
Total Bilirubin: 0.4 mg/dL (ref 0.2–1.2)
Total Protein: 6.4 g/dL (ref 6.1–8.1)

## 2019-04-03 LAB — LIPID PANEL
Cholesterol: 192 mg/dL (ref <200)
HDL: 41 mg/dL (ref >=40)
LDL Cholesterol (Calc): 129 mg/dL (calc) — ABNORMAL HIGH
Non-HDL Cholesterol (Calc): 151 mg/dL (calc) — ABNORMAL HIGH (ref <130)
Total CHOL/HDL Ratio: 4.7 (calc) (ref <5.0)
Triglycerides: 108 mg/dL (ref <150)

## 2019-04-03 LAB — HEMOGLOBIN A1C
Hgb A1c MFr Bld: 5.6 % of total Hgb (ref <5.7)
Mean Plasma Glucose: 114 (calc)
eAG (mmol/L): 6.3 (calc)

## 2019-04-03 NOTE — Progress Notes (Signed)
HPI: Brett [name] is a 73 y.o. male who returns today for evaluation of left ear hearing loss.  The tube out of the left ear was removed about 4 months ago.  He done well for several months but more recently has noticed some blockage of his hearing on the left side and saw his medical doctor who noticed mild hearing loss in the left ear on hearing screening.  He has not been using Flonase on a regular basis because he did not feel like it helped much.  Denies any pain in the ear..  Past Medical History:  Diagnosis Date  . Allergy   . Arthritis   . Cataract   . GERD (gastroesophageal reflux disease)   . History of hiatal hernia   . Hyperlipidemia   . Pneumonia    hx. of  . PONV (postoperative nausea and vomiting)    with gallbladder surgery  . Sleep apnea    Past Surgical History:  Procedure Laterality Date  . CARPAL TUNNEL RELEASE Bilateral   . CHOLECYSTECTOMY  2004  . COLONOSCOPY    . ESOPHAGOGASTRODUODENOSCOPY    . EYE SURGERY     cataract  . FRACTURE SURGERY  2006   fx. wrist  . JOINT REPLACEMENT    . rhino plasty     deviated septum  . SHOULDER SURGERY  2011   rotator cuff  . TOTAL HIP ARTHROPLASTY Right 12/13/2016   Procedure: RIGHT TOTAL HIP ARTHROPLASTY ANTERIOR APPROACH;  Surgeon: Rod Can, MD;  Location: [place];  Service: Orthopedics;  Laterality: Right;  . TOTAL SHOULDER ARTHROPLASTY Right 04/15/2017   Procedure: RIGHT TOTAL SHOULDER ARTHROPLASTY;  Surgeon: Netta Cedars, MD;  Location: [place];  Service: Orthopedics;  Laterality: Right;  Marland Kitchen VASECTOMY  1980   Social History   Socioeconomic History  . Marital status: Married    Spouse name: Not on file  . Number of children: Not on file  . Years of education: Not on file  . Highest education level: Not on file  Occupational History  . Not on file  Social Needs  . Financial resource strain: Not on file  . Food insecurity    Worry: Not on file    Inability: Not on file  . Transportation needs   Medical: Not on file    Non-medical: Not on file  Tobacco Use  . Smoking status: Former Smoker    Packs/day: 2.00    Years: 51.00    Pack years: 102.00    Types: Cigarettes, Cigars    Start date: 1963    Quit date: 2012    Years since quitting: 8.8  . Smokeless tobacco: Never Used  Substance and Sexual Activity  . Alcohol use: No  . Drug use: No  . Sexual activity: Yes  Lifestyle  . Physical activity    Days per week: Not on file    Minutes per session: Not on file  . Stress: Not on file  Relationships  . Social Herbalist on phone: Not on file    Gets together: Not on file    Attends religious service: Not on file    Active member of club or organization: Not on file    Attends meetings of clubs or organizations: Not on file    Relationship status: Not on file  Other Topics Concern  . Not on file  Social History Narrative  . Not on file   Family History  Problem Relation Age of Onset  .  Cancer Sister        uterine   . Cancer Maternal Grandmother    Allergies  Allergen Reactions  . Tramadol Nausea Only   Prior to Admission medications   Medication Sig Start Date End Date Taking? Authorizing Provider  amLODipine (NORVASC) 10 MG tablet Take 1 tablet (10 mg total) by mouth daily. 08/08/18  Yes [place], Modena Nunnery, MD  cetirizine (ZYRTEC) 10 MG tablet Take 10 mg by mouth daily.   Yes [provider]  colestipol (COLESTID) 1 g tablet Take 1 tablet (1 g total) by mouth 2 (two) times daily. 04/02/19  Yes [place], Modena Nunnery, MD  diclofenac (VOLTAREN) 75 MG EC tablet Take 1 tablet (75 mg total) by mouth 2 (two) times daily. 05/26/18  Yes [place], Modena Nunnery, MD  fenofibrate (TRICOR) 48 MG tablet TAKE 1 TABLET DAILY 05/08/18  Yes [place], Modena Nunnery, MD  fluticasone [facility]) 50 MCG/ACT nasal spray Place 2 sprays into both nostrils daily. 02/03/18  Yes Delsa Grana, PA-C  losartan (COZAAR) 25 MG tablet Take 1 tablet (25 mg total) by mouth daily. 08/08/18  Yes  [place], Modena Nunnery, MD  montelukast (SINGULAIR) 10 MG tablet TAKE 1 TABLET AT BEDTIME 01/09/19  Yes [place], Modena Nunnery, MD  omeprazole (PRILOSEC) 20 MG capsule TAKE 1 CAPSULE DAILY 05/25/18  Yes [place], Modena Nunnery, MD  tamsulosin (FLOMAX) 0.4 MG CAPS capsule TAKE 1 CAPSULE DAILY 10/12/18  Yes [place], Modena Nunnery, MD     Positive ROS: Otherwise negative  All other systems have been reviewed and were otherwise negative with the exception of those mentioned in the HPI and as above.  Physical Exam: Constitutional: Alert, well-appearing, no acute distress Ears: External ears without lesions or tenderness. Ear canals are clear bilaterally.  Right TM is clear left TM is retracted with a left serous otitis media.  Left M & T was performed in the office today using phenol as a topical anesthetic.  Paparella type I tube was inserted.. Nasal: External nose without lesions. Septum septum with slight deviation.  Moderate rhinitis.. Clear nasal passages Oral: Lips and gums without lesions. Tongue and palate mucosa without lesions. Posterior oropharynx clear. Neck: No palpable adenopathy or masses Respiratory: Breathing comfortably  Skin: No facial/neck lesions or rash noted.  Myringotomy  Date/Time: 04/03/2019 2:24 PM Performed by: Rozetta Nunnery, MD Authorized by: Rozetta Nunnery, MD   Consent:    Consent obtained:  Verbal   Consent given by:  Patient   Risks discussed:  Bleeding and pain   Alternatives discussed:  No treatment Pre-procedure details:    Indications: serous otitis media   Anesthesia:    Anesthesia method:  Topical application   Topical anesthetic:  Phenol Procedure Details:    Location:  Left TM   Tube:  Paparella Type 1 Findings:    Fluid:  Serous fluid Post-procedure details:    Patient tolerance of procedure:  Tolerated well, no immediate complications    Assessment: Recurrent left serous otitis media with conductive hearing loss  Plan: M&T was performed in  the office today using Paparella type I tube.  If this extrudes and he needs a recurrent myringotomy tube placed consider placement of a T-tube. Suggested use of Nasacort 2 sprays each nostril at night as this will help improve nasal congestion and eustachian tube function. He will follow-up as needed.   Radene Journey, MD

## 2019-04-05 ENCOUNTER — Encounter: Payer: Self-pay | Admitting: *Deleted

## 2019-04-18 DIAGNOSIS — M19012 Primary osteoarthritis, left shoulder: Secondary | ICD-10-CM | POA: Diagnosis not present

## 2019-04-18 DIAGNOSIS — M25512 Pain in left shoulder: Secondary | ICD-10-CM | POA: Diagnosis not present

## 2019-04-23 ENCOUNTER — Telehealth: Payer: Self-pay | Admitting: *Deleted

## 2019-04-23 ENCOUNTER — Other Ambulatory Visit: Payer: Self-pay

## 2019-04-23 MED ORDER — COLESTIPOL HCL 1 G PO TABS: 1.0000 g | ORAL_TABLET | Freq: Two times a day (BID) | ORAL | 0 refills | Status: DC

## 2019-04-23 MED ORDER — FENOFIBRATE 48 MG PO TABS: 48.0000 mg | ORAL_TABLET | Freq: Every day | ORAL | 0 refills | Status: DC

## 2019-04-23 MED ORDER — COLESEVELAM HCL 625 MG PO TABS: 625.0000 mg | ORAL_TABLET | Freq: Two times a day (BID) | ORAL | 3 refills | Status: DC

## 2019-04-23 NOTE — Telephone Encounter (Signed)
I sent in generic of Welchol starting at twice a day ( cheapest option), if his bowels are not controlled, we will have to adjust the medication  Try it for 4 weeks to let it get in his system

## 2019-04-23 NOTE — Telephone Encounter (Signed)
Received call from patient.   Reports that Colestid is out of stock at [facility].   Requested alternative as he fears this may be ongoing issue and does not want to go to another pharmacy.   MD please advise.

## 2019-04-23 NOTE — Telephone Encounter (Signed)
Received call from patient.   States that he cannot go back on statin. States that he was using Colestid for rapid intestinal transit due to gallbladder removal.   States that he would like to try another Bile Acid Sequestrant like Welchol or Questran.   MD please advise.

## 2019-04-23 NOTE — Telephone Encounter (Signed)
In order to control cholesterol, He would need to restart a statin drug, he was on pravastatin in the past, he could restart 40mg  at bedtime in addition to tricor  However the colestid helped with his GI discomfort and bowels after gallbladder in addition to cholesterol. So stopping all together may cause him to have bowel issues. Of course he can do a trial and see how he does  Otherwise, I would send to another pharmacy

## 2019-04-26 MED ORDER — COLESTIPOL HCL 1 G PO TABS: 1.0000 g | ORAL_TABLET | Freq: Two times a day (BID) | ORAL | 0 refills | Status: DC

## 2019-04-26 NOTE — Telephone Encounter (Signed)
Call placed to patient.   States that he was able to get Colestid from [facility] for 30 day supply and mail order is sending out 90 day supply.   Wechol D/C'ed.

## 2019-04-26 NOTE — Addendum Note (Signed)
Addended by: Sheral Flow on: 04/26/2019 08:44 AM   Modules accepted: Orders

## 2019-04-28 ENCOUNTER — Other Ambulatory Visit: Payer: Self-pay | Admitting: Family Medicine

## 2019-05-28 DIAGNOSIS — H348322 Tributary (branch) retinal vein occlusion, left eye, stable: Secondary | ICD-10-CM | POA: Diagnosis not present

## 2019-05-29 ENCOUNTER — Encounter: Payer: Self-pay | Admitting: Family Medicine

## 2019-05-29 DIAGNOSIS — K219 Gastro-esophageal reflux disease without esophagitis: Secondary | ICD-10-CM

## 2019-05-29 DIAGNOSIS — Z9049 Acquired absence of other specified parts of digestive tract: Secondary | ICD-10-CM

## 2019-05-29 DIAGNOSIS — K9089 Other intestinal malabsorption: Secondary | ICD-10-CM

## 2019-05-31 ENCOUNTER — Encounter: Payer: Self-pay | Admitting: Gastroenterology

## 2019-06-18 DIAGNOSIS — D649 Anemia, unspecified: Secondary | ICD-10-CM

## 2019-06-18 HISTORY — DX: Anemia, unspecified: D64.9

## 2019-06-25 ENCOUNTER — Ambulatory Visit: Payer: Medicare HMO | Attending: Internal Medicine

## 2019-06-25 DIAGNOSIS — Z23 Encounter for immunization: Secondary | ICD-10-CM | POA: Insufficient documentation

## 2019-06-25 NOTE — Progress Notes (Signed)
Covid-19 Vaccination Clinic  Name:  DERIUS LATNEY    MRN: [id number] DOB: [birth year]  06/25/2019  Mr. Brandl was observed post Covid-19 immunization for 15 minutes without incidence. He was provided with Vaccine Information Sheet and instruction to access the V-Safe system.   Mr. Burgin was instructed to call 911 with any severe reactions post vaccine: Marland Kitchen Difficulty breathing  . Swelling of your face and throat  . A fast heartbeat  . A bad rash all over your body  . Dizziness and weakness    Immunizations Administered    Name Date Dose VIS Date Route   Pfizer COVID-19 Vaccine 06/25/2019  6:18 PM 0.3 mL 04/27/2019 Intramuscular   Manufacturer: [employer], [employer]   Lot: [id number]   NDC: [address]-2

## 2019-07-03 ENCOUNTER — Encounter: Payer: Self-pay | Admitting: Gastroenterology

## 2019-07-03 ENCOUNTER — Other Ambulatory Visit: Payer: Self-pay

## 2019-07-03 ENCOUNTER — Ambulatory Visit (INDEPENDENT_AMBULATORY_CARE_PROVIDER_SITE_OTHER): Payer: Medicare HMO | Admitting: Gastroenterology

## 2019-07-03 ENCOUNTER — Other Ambulatory Visit (INDEPENDENT_AMBULATORY_CARE_PROVIDER_SITE_OTHER): Payer: Medicare HMO

## 2019-07-03 VITALS — BP 130/66 | HR 64 | Temp 97.8°F | Ht 68.0 in | Wt 192.0 lb

## 2019-07-03 DIAGNOSIS — K915 Postcholecystectomy syndrome: Secondary | ICD-10-CM

## 2019-07-03 DIAGNOSIS — D649 Anemia, unspecified: Secondary | ICD-10-CM | POA: Diagnosis not present

## 2019-07-03 DIAGNOSIS — K529 Noninfective gastroenteritis and colitis, unspecified: Secondary | ICD-10-CM | POA: Diagnosis not present

## 2019-07-03 LAB — CBC WITH DIFFERENTIAL/PLATELET
Basophils Absolute: 0.1 10*3/uL (ref 0.0–0.1)
Basophils Relative: 1.1 % (ref 0.0–3.0)
Eosinophils Absolute: 0.3 10*3/uL (ref 0.0–0.7)
Eosinophils Relative: 5.3 % — ABNORMAL HIGH (ref 0.0–5.0)
HCT: 42.1 % (ref 39.0–52.0)
Hemoglobin: 14 g/dL (ref 13.0–17.0)
Lymphocytes Relative: 24.2 % (ref 12.0–46.0)
Lymphs Abs: 1.6 10*3/uL (ref 0.7–4.0)
MCHC: 33.2 g/dL (ref 30.0–36.0)
MCV: 82.8 fl (ref 78.0–100.0)
Monocytes Absolute: 0.4 10*3/uL (ref 0.1–1.0)
Monocytes Relative: 6.5 % (ref 3.0–12.0)
Neutro Abs: 4.1 10*3/uL (ref 1.4–7.7)
Neutrophils Relative %: 62.9 % (ref 43.0–77.0)
Platelets: 310 10*3/uL (ref 150.0–400.0)
RBC: 5.08 Mil/uL (ref 4.22–5.81)
RDW: 14 % (ref 11.5–15.5)
WBC: 6.5 10*3/uL (ref 4.0–10.5)

## 2019-07-03 LAB — IBC + FERRITIN
Ferritin: 16.6 ng/mL — ABNORMAL LOW (ref 22.0–322.0)
Iron: 74 ug/dL (ref 42–165)
Saturation Ratios: 15.8 % — ABNORMAL LOW (ref 20.0–50.0)
Transferrin: 334 mg/dL (ref 212.0–360.0)

## 2019-07-03 MED ORDER — CHOLESTYRAMINE 4 G PO PACK: 2.0000 g | PACK | Freq: Two times a day (BID) | ORAL | 1 refills | Status: DC

## 2019-07-03 NOTE — Patient Instructions (Addendum)
If you are age 72 or older, your body mass index should be between 23-30. Your Body mass index is 29.19 kg/m. If this is out of the aforementioned range listed, please consider follow up with your Primary Care Provider.  If you are age 20 or younger, your body mass index should be between 19-25. Your Body mass index is 29.19 kg/m. If this is out of the aformentioned range listed, please consider follow up with your Primary Care Provider.   Please go to the lab in the basement of our building to have lab work done as you leave today. Hit "B" for basement when you get on the elevator.  When the doors open the lab is on your left.  We will call you with the results. Thank you.  Due to recent changes in healthcare laws, you may see the results of your imaging and laboratory studies on MyChart before your provider has had a chance to review them.  We understand that in some cases there may be results that are confusing or concerning to you. Not all laboratory results come back in the same time frame and the provider may be waiting for multiple results in order to interpret others.  Please give [place] 48 hours in order for your provider to thoroughly review all the results before contacting the office for clarification of your results.   We have sent the following medications to your pharmacy for you to pick up at your convenience: START Cholestyramine 4g 2-4 g (1/2 pack to a whole pack) 1-2 times daily and titrate as needed.  If this is not helpful you can purchase the following medications over the counter and take as directed:  a Probiotic similar to VSL#3 at [email or web address], you may also be able to find them at [employer], [place], [employer] or [employer]. Thank you for entrusting me with your care and for choosing [facility], Dr. [place] Cellar

## 2019-07-03 NOTE — Progress Notes (Signed)
HPI :  74 year old male with a history of GERD, suspected postcholecystectomy diarrhea, hypertension, here for new patient assessment of chronic bowel changes.  The patient underwent cholecystectomy in 2004, since that time he states he has had problems with his bowels.  Namely he has loose stools with some urgency if he does not take anything for it.  He had negative serologic work-up for celiac disease at that time.  He had an EGD and colonoscopy x2 each, negative small bowel biopsies and negative random colon biopsy in 2007 without evidence of celiac or microscopic colitis. His work-up is as outlined below.  His last colonoscopy was in 2014 and was normal without polyps.  No biopsies were obtained at that time.  He states he has been on longstanding Colestid HCl taken 1-2 tabs a day depending on how he is feeling.  This has reliably helped his bowels significantly.  He titrates the dose up or down depending on if his bowel is too loose or on the constipated side.  He has not noticed any change in his diet recently.  He reports Colestid HCl is no longer made and only one form of Colestid is available.  Since he has been on this particular formulation, he has had some increase in gas and perhaps the medications not working as well as it used to.  He occasionally has some urgency.  Has usually 2 bowel movement a day without blood.  He has no family history of colon cancer.  He does take diclofenac typically every day for neck pain which allows him to function much better.  He does take omeprazole 20 mg a day which works well to control his reflux symptoms.  He has had a small hiatal hernia and very mild esophagitis noted on remote endoscopy, no evidence of Barrett's esophagus historically.  No history of peptic ulcer disease.  On review of his labs his hemoglobin had been normal for the past few years, although I am seeing that his hemoglobin was 12.8 as of November 20 with an MCV of 83.  He has never  tried cholestyramine in the past.  His insurance/pharmacy has listed options of medications that can be covered for this particular issue.  He states he has not been able to get Colestid HCl at any pharmacy.   Prior workup: Negative celiac labs remotely  CT scan abd / pelvis with contrast 10/2005  - small pulmonary nodule, diverticulosis  EGD 105/2007 - small HH, mild gastric erythema - otherwise normal exam, normal duodenal biopsies, gastric biopsies negative for H pylori  Colonoscopy 10/29/2005 - diverticulosis, normal ileum, normal exam - biopsies of left colon only - normal  EGD 06/12/2012 - small HH, mild esophagitis,   Colonoscopy 06/12/12 - good prep, diverticulosis sigmoid colon, otherwise normal exam    Past Medical History:  Diagnosis Date  . Allergy   . Arthritis   . Cataract   . Diverticulosis   . GERD (gastroesophageal reflux disease)   . Hiatal hernia 2014   Dr. Lyda Perone [facility])  . History of hiatal hernia   . Hyperlipidemia   . IBS (irritable colon syndrome)   . Pneumonia    hx. of  . PONV (postoperative nausea and vomiting)    with gallbladder surgery  . Sleep apnea      Past Surgical History:  Procedure Laterality Date  . CARPAL TUNNEL RELEASE Bilateral   . CHOLECYSTECTOMY  2004  . COLONOSCOPY    . ESOPHAGOGASTRODUODENOSCOPY    . EYE  SURGERY     cataract  . FRACTURE SURGERY  2006   fx. wrist  . JOINT REPLACEMENT    . KNEE SURGERY Left 2020  . rhino plasty     deviated septum  . SHOULDER SURGERY  2011   rotator cuff  . TOTAL HIP ARTHROPLASTY Right 12/13/2016   Procedure: RIGHT TOTAL HIP ARTHROPLASTY ANTERIOR APPROACH;  Surgeon: Rod Can, MD;  Location: [facility];  Service: Orthopedics;  Laterality: Right;  . TOTAL SHOULDER ARTHROPLASTY Right 04/15/2017   Procedure: RIGHT TOTAL SHOULDER ARTHROPLASTY;  Surgeon: Netta Cedars, MD;  Location: [facility];  Service: Orthopedics;  Laterality: Right;  Marland Kitchen VASECTOMY  1980   Family History  Problem Relation Age  of Onset  . Cancer Sister        uterine   . Cancer Maternal Grandmother    Social History   Tobacco Use  . Smoking status: Former Smoker    Packs/day: 2.00    Years: 51.00    Pack years: 102.00    Types: Cigarettes, Cigars    Start date: 1963    Quit date: 2012    Years since quitting: 9.1  . Smokeless tobacco: Never Used  Substance Use Topics  . Alcohol use: No  . Drug use: No   Current Outpatient Medications  Medication Sig Dispense Refill  . amLODipine (NORVASC) 10 MG tablet Take 1 tablet (10 mg total) by mouth daily. 90 tablet 3  . cetirizine (ZYRTEC) 10 MG tablet Take 10 mg by mouth daily.    . colestipol (COLESTID) 1 g tablet Take 1 tablet (1 g total) by mouth 2 (two) times daily. 180 tablet 0  . diclofenac (VOLTAREN) 75 MG EC tablet Take 1 tablet (75 mg total) by mouth 2 (two) times daily. 180 tablet 2  . fenofibrate (TRICOR) 48 MG tablet Take 1 tablet (48 mg total) by mouth daily. 90 tablet 0  . fluticasone (FLONASE) 50 MCG/ACT nasal spray Place 2 sprays into both nostrils daily. 16 g 6  . losartan (COZAAR) 25 MG tablet TAKE 1 TABLET DAILY 90 tablet 3  . montelukast (SINGULAIR) 10 MG tablet TAKE 1 TABLET AT BEDTIME 90 tablet 3  . omeprazole (PRILOSEC) 20 MG capsule TAKE 1 CAPSULE DAILY 90 capsule 4  . tamsulosin (FLOMAX) 0.4 MG CAPS capsule TAKE 1 CAPSULE DAILY 90 capsule 3   No current facility-administered medications for this visit.   Allergies  Allergen Reactions  . Tramadol Nausea Only     Review of Systems: All systems reviewed and negative except where noted in HPI.    No results found.  Physical Exam: BP 130/66   Pulse 64   Temp 97.8 F (36.6 C)   Ht 5\' 8"  (1.727 m)   Wt 192 lb (87.1 kg)   BMI 29.19 kg/m  Constitutional: Pleasant,well-developed, male in no acute distress. HEENT: Normocephalic and atraumatic. Conjunctivae are normal. No scleral icterus. Neck supple.  Cardiovascular: Normal rate, regular rhythm.  Pulmonary/chest: Effort  normal and breath sounds normal. No wheezing, rales or rhonchi. Abdominal: Soft, nondistended, nontender.  There are no masses palpable. Extremities: no edema Lymphadenopathy: No cervical adenopathy noted. Neurological: Alert and oriented to person place and time. Skin: Skin is warm and dry. No rashes noted. Psychiatric: Normal mood and affect. Behavior is normal.   ASSESSMENT AND PLAN: 74 year old male here for new patient assessment of the following:  Post cholecystectomy syndrome / chronic diarrhea - he has had a pretty good work-up over the years and reliably good  response to Colestid HCl previously, suspect his cholecystectomy is likely the culprit of the symptoms however symptoms have unfortunately persisted.  Previously very well controlled with Colestid HCl however this formulation is no longer available, now with increased bloating and gas on the more generic version.  Unclear if the medication changes is related to this symptom or something else, we discussed intestinal gas and bloating in relation to diet and small bowel bacteria.  I reviewed the pharmacy recommendations in regards to his insurance for coverage for this issue, we will try him on cholestyramine 2 to 4 g once to twice daily as needed and will stop the generic colestid.  He can titrate the dose up or down as needed pending his course.  If he has intolerance to this he will let me know.  If this does not help his as much as the prior regimen, perhaps that then the gas would not be related to the change in his Colestid formulation and could go back to that if needed.  If gas and bloating persists then would give a trial of probiotics such as VSL 3 or equivalent.  We briefly discussed low FODMAP diet, he states his wife is a nutritionist and has been mindful of foods he is eating in relation to the symptoms.  He agreed with the plan, we will see how he does on cholestyramine plus minus probiotic.  If symptoms persist I asked him to  contact me for reassessment.  He agreed  Anemia - noted to have a relatively recent anemia with MCV on the low side.  I will repeat a CBC and iron studies to assess for iron deficiency.  If he has a new iron deficiency he will warrant further endoscopic evaluation.  In general he does not wish to have any more screening colonoscopies given his last exam in 2014 were normal, he would not be due till after age 4.  We will await his labs with further recommendations.  I spent 45 minutes of time, including in depth chart review, independent review of results as outlined above, communicating results with the patient directly, face-to-face time with the patient, coordinating care, and documenting this encounter.    Allenton Cellar, MD [facility]  CC: Alycia Rossetti, MD

## 2019-07-04 ENCOUNTER — Encounter: Payer: Self-pay | Admitting: Gastroenterology

## 2019-07-18 ENCOUNTER — Encounter: Payer: Self-pay | Admitting: *Deleted

## 2019-07-20 ENCOUNTER — Ambulatory Visit: Payer: Medicare HMO | Attending: Internal Medicine

## 2019-07-20 DIAGNOSIS — Z23 Encounter for immunization: Secondary | ICD-10-CM

## 2019-07-20 NOTE — Progress Notes (Signed)
Covid-19 Vaccination Clinic  Name:  Brett [name]    MRN: [id number] DOB: Sep 17, 1945  07/20/2019  Brett [name] was observed post Covid-19 immunization for 15 minutes without incident. He was provided with Vaccine Information Sheet and instruction to access the V-Safe system.   Brett [name] was instructed to call 911 with any severe reactions post vaccine: Marland Kitchen Difficulty breathing  . Swelling of face and throat  . A fast heartbeat  . A bad rash all over body  . Dizziness and weakness   Immunizations Administered    Name Date Dose VIS Date Route   Pfizer COVID-19 Vaccine 07/20/2019  6:12 PM 0.3 mL 04/27/2019 Intramuscular   Manufacturer: [employer], [employer]   Lot: [id number]   NDC: [address]-4

## 2019-07-26 ENCOUNTER — Other Ambulatory Visit: Payer: Self-pay

## 2019-07-26 ENCOUNTER — Ambulatory Visit: Payer: Medicare HMO

## 2019-07-26 VITALS — Temp 97.5°F | Ht 68.0 in | Wt 191.0 lb

## 2019-07-26 DIAGNOSIS — K529 Noninfective gastroenteritis and colitis, unspecified: Secondary | ICD-10-CM

## 2019-07-26 DIAGNOSIS — K915 Postcholecystectomy syndrome: Secondary | ICD-10-CM

## 2019-07-26 MED ORDER — PLENVU 140 G PO SOLR: 1.0000 | Freq: Once | ORAL | 0 refills | Status: AC

## 2019-07-26 NOTE — Progress Notes (Addendum)
No egg or soy allergy known to patient  No issues with past sedation with any surgeries  or procedures, no intubation problems  No diet pills per patient No home 02 use per patient  No blood thinners per patient  Pt denies issues with constipation  No A fib or A flutter  EMMI video sent to pt's e mail   Pt had second Covid vaccine on 07/20/19, no covid test needed.  plenvu sample given  Due to the COVID-19 pandemic we are asking patients to follow these guidelines. Please only bring one care partner. Please be aware that your care partner may wait in the car in the parking lot or if they feel like they will be too hot to wait in the car, they may wait in the lobby on the 4th floor. All care partners are required to wear a mask the entire time (we do not have any that we can provide them), they need to practice social distancing, and we will do a Covid check for all patient's and care partners when you arrive. Also we will check their temperature and your temperature. If the care partner waits in their car they need to stay in the parking lot the entire time and we will call them on their cell phone when the patient is ready for discharge so they can bring the car to the front of the building. Also all patient's will need to wear a mask into building.

## 2019-07-31 ENCOUNTER — Encounter: Payer: Self-pay | Admitting: Family Medicine

## 2019-07-31 ENCOUNTER — Other Ambulatory Visit: Payer: Self-pay

## 2019-07-31 ENCOUNTER — Ambulatory Visit (INDEPENDENT_AMBULATORY_CARE_PROVIDER_SITE_OTHER): Payer: Medicare HMO | Admitting: Family Medicine

## 2019-07-31 VITALS — BP 122/64 | HR 68 | Temp 98.7°F | Resp 14 | Ht 68.0 in | Wt 191.0 lb

## 2019-07-31 DIAGNOSIS — G4733 Obstructive sleep apnea (adult) (pediatric): Secondary | ICD-10-CM | POA: Diagnosis not present

## 2019-07-31 DIAGNOSIS — I10 Essential (primary) hypertension: Secondary | ICD-10-CM

## 2019-07-31 DIAGNOSIS — Z66 Do not resuscitate: Secondary | ICD-10-CM | POA: Diagnosis not present

## 2019-07-31 DIAGNOSIS — Z0000 Encounter for general adult medical examination without abnormal findings: Secondary | ICD-10-CM | POA: Diagnosis not present

## 2019-07-31 DIAGNOSIS — Z9989 Dependence on other enabling machines and devices: Secondary | ICD-10-CM

## 2019-07-31 DIAGNOSIS — Z125 Encounter for screening for malignant neoplasm of prostate: Secondary | ICD-10-CM

## 2019-07-31 DIAGNOSIS — E782 Mixed hyperlipidemia: Secondary | ICD-10-CM

## 2019-07-31 MED ORDER — TRIAMCINOLONE ACETONIDE 55 MCG/ACT NA AERO: 2.0000 | INHALATION_SPRAY | Freq: Every day | NASAL | 3 refills | Status: DC

## 2019-07-31 NOTE — Progress Notes (Signed)
Subjective:   Patient presents for Medicare Annual/Subsequent preventive examination.   Pt here for CPE  HTN-he is taking medications as prescribed no side effects.  Hyperlipidemia- taking colestid and tricor   BPH- on flmoax, last PSA 0.6 , I yw ear, flomax doing well   Chronic insomnia- taking ambiem   Dr. Lucia Gaskins placed eft ear tube, [facility]  - [facility]   Scheduled for EGD/Colonoscopy -    Off the probiotics  Iron def anemia- taking iron once a day  Questran -  1 packet daily   Dermatology- has appt next week, has lesion on right side of face    [facility]   His only new concern today is he gets some intermittent numbness down his left arm.  He has known chronic neck pain is also had carpal tunnel surgery in both hands.  He has not had any recent injury.  He was already evaluated by orthopedics they do not think the cause is due to his shoulder.  He feels like his strength is intact.  He just wanted noted but did not want to pursue work-up at this time..    Review Past Medical/Family/Social: pER EMR   Risk Factors  Current exercise habits: walks Dietary issues discussed: yes  Cardiac risk factors: HTN, high triglyceries  Depression Screen  (Note: if answer to either of the following is "Yes", a more complete depression screening is indicated)  Over the past two weeks, have you felt down, depressed or hopeless? No Over the past two weeks, have you felt little interest or pleasure in doing things? No Have you lost interest or pleasure in daily life? No Do you often feel hopeless? No Do you cry easily over simple problems? No   Activities of Daily Living  In your present state of health, do you have any difficulty performing the following activities?:  Driving? No  Managing money? No  Feeding yourself? No  Getting from bed to chair? No  Climbing a flight of stairs? No  Preparing food and eating?: No  Bathing or  showering? No  Getting dressed: No  Getting to the toilet? No  Using the toilet:No  Moving around from place to place: No  In the past year have you fallen or had a near fall?:No  Are you sexually active? No  Do you have more than one partner? No   Hearing Difficulties: No  Do you often ask people to speak up or repeat themselves? No  Do you experience ringing or noises in your ears? No Do you have difficulty understanding soft or whispered voices? No  Do you feel that you have a problem with memory? No Do you often misplace items? No  Do you feel safe at home? Yes  Cognitive Testing  Alert? Yes Normal Appearance?Yes  Oriented to person? Yes Place? Yes  Time? Yes  Recall of three objects? Yes  Can perform simple calculations? Yes  Displays appropriate judgment?Yes  Can read the correct time from a watch face?Yes   List the Names of Other Physician/Practitioners you currently use:   Orthopedics  Ophthalmology- Dr. Alanda Slim /  Retinal Vein Occlusion  Stable    [facility]    [facility]   Dermatology   Screening Tests / Date Colonoscopy   UTD                  Zostavax  UTD Pneumonia- UTD Influenza Vaccine  UTD Tetanus/tdapUTD  ROS: GEN- denies fatigue, fever, weight loss,weakness, recent illness HEENT- denies eye drainage, change in vision, nasal discharge, CVS- denies chest pain, palpitations RESP-+ SOB, denies  cough, wheeze ABD- denies N/V, change in stools, abd pain GU- denies dysuria, hematuria, dribbling, incontinence MSK- denies joint pain, muscle aches, injury Neuro- denies headache, +dizziness, syncope, seizure activity  GEN- NAD, alert and oriented x3,vitals reviewed HEENT- PERRL, EOMI, non injected sclera, pink conjunctiva, MMM, oropharynx clear Neck- Supple, no thryomegaly, negative Spurling's no carotid bruit CVS- RRR, no murmur RESP-CTAB ABD-nabs,soft, NT,ND Neuro-CNII-XII in tact, no focal deficits , sensation grossly intact upper extremity  strength intact upper extremities EXT- No edema Pulses- Radial, DP- 2+    Assessment:    Annual wellness medicare exam   Plan:    During the course of the visit the patient was educated and counseled about appropriate screening and preventive services including:  Audit C/ depression/fall screening negative  Pt has advanced directives he is a DNR  [facility]-to use CPAP therapy continues to benefit from this.  Hypertension blood pressure is controlled  For now we will monitor his symptoms with regards to the intermittent numbness it sounds like he has some type of nerve impingement likely from the neck since he has known degenerative disc disease in the cervical spine is already had carpal tunnel surgery.  Our next step to possibly getting a nerve conduction study and then determine if MRI is needed of the C-spine   Insomnia continue ambien prn   Hyperlipidemia- continue current meds check labs  We will continue to follow-up with gastroenterology he is scheduled for EGD and colonoscopy will then have his medications adjusted  BPH symptoms controlled on Flomax PSA screening to be done for prostate cancer    Diet review for nutrition referral? Yes ____ Not Indicated __x__  Patient Instructions (the written plan) was given to the patient.  Medicare Attestation  I have personally reviewed:  The patient's medical and social history  Their use of alcohol, tobacco or illicit drugs  Their current medications and supplements  The patient's functional ability including ADLs,fall risks, home safety risks, cognitive, and hearing and visual impairment  Diet and physical activities  Evidence for depression or mood disorders  The patient's weight, height, BMI, and visual acuity have been recorded in the chart. I have made referrals, counseling, and provided education to the patient based on review of the above and I have provided the patient with a written personalized care plan for  preventive services.

## 2019-07-31 NOTE — Patient Instructions (Signed)
F/U 6 months

## 2019-08-01 ENCOUNTER — Other Ambulatory Visit: Payer: Medicare HMO

## 2019-08-01 ENCOUNTER — Other Ambulatory Visit: Payer: Self-pay | Admitting: Family Medicine

## 2019-08-02 LAB — COMPREHENSIVE METABOLIC PANEL
AG Ratio: 1.9 (calc) (ref 1.0–2.5)
ALT: 19 U/L (ref 9–46)
AST: 16 U/L (ref 10–35)
Albumin: 4.2 g/dL (ref 3.6–5.1)
Alkaline phosphatase (APISO): 44 U/L (ref 35–144)
BUN: 16 mg/dL (ref 7–25)
CO2: 22 mmol/L (ref 20–32)
Calcium: 9.1 mg/dL (ref 8.6–10.3)
Chloride: 107 mmol/L (ref 98–110)
Creat: 1 mg/dL (ref 0.70–1.18)
Globulin: 2.2 g/dL (calc) (ref 1.9–3.7)
Glucose, Bld: 91 mg/dL (ref 65–99)
Potassium: 4.7 mmol/L (ref 3.5–5.3)
Sodium: 141 mmol/L (ref 135–146)
Total Bilirubin: 0.4 mg/dL (ref 0.2–1.2)
Total Protein: 6.4 g/dL (ref 6.1–8.1)

## 2019-08-02 LAB — CBC WITH DIFFERENTIAL/PLATELET
Absolute Monocytes: 486 cells/uL (ref 200–950)
Basophils Absolute: 91 cells/uL (ref 0–200)
Basophils Relative: 1.2 %
Eosinophils Absolute: 418 cells/uL (ref 15–500)
Eosinophils Relative: 5.5 %
HCT: 39.5 % (ref 38.5–50.0)
Hemoglobin: 13.5 g/dL (ref 13.2–17.1)
Lymphs Abs: 1900 cells/uL (ref 850–3900)
MCH: 28.5 pg (ref 27.0–33.0)
MCHC: 34.2 g/dL (ref 32.0–36.0)
MCV: 83.3 fL (ref 80.0–100.0)
MPV: 9.1 fL (ref 7.5–12.5)
Monocytes Relative: 6.4 %
Neutro Abs: 4704 cells/uL (ref 1500–7800)
Neutrophils Relative %: 61.9 %
Platelets: 309 10*3/uL (ref 140–400)
RBC: 4.74 10*6/uL (ref 4.20–5.80)
RDW: 12.8 % (ref 11.0–15.0)
Total Lymphocyte: 25 %
WBC: 7.6 10*3/uL (ref 3.8–10.8)

## 2019-08-02 LAB — PSA: PSA: 0.6 ng/mL (ref <=4.0)

## 2019-08-02 LAB — LIPID PANEL
Cholesterol: 211 mg/dL — ABNORMAL HIGH (ref <200)
HDL: 34 mg/dL — ABNORMAL LOW (ref >=40)
LDL Cholesterol (Calc): 144 mg/dL (calc) — ABNORMAL HIGH
Non-HDL Cholesterol (Calc): 177 mg/dL (calc) — ABNORMAL HIGH (ref <130)
Total CHOL/HDL Ratio: 6.2 (calc) — ABNORMAL HIGH (ref <5.0)
Triglycerides: 192 mg/dL — ABNORMAL HIGH (ref <150)

## 2019-08-06 ENCOUNTER — Ambulatory Visit (INDEPENDENT_AMBULATORY_CARE_PROVIDER_SITE_OTHER): Payer: Medicare HMO | Admitting: Physician Assistant

## 2019-08-06 ENCOUNTER — Encounter: Payer: Self-pay | Admitting: Physician Assistant

## 2019-08-06 ENCOUNTER — Other Ambulatory Visit: Payer: Self-pay

## 2019-08-06 ENCOUNTER — Encounter: Payer: Self-pay | Admitting: Gastroenterology

## 2019-08-06 DIAGNOSIS — Z86007 Personal history of in-situ neoplasm of skin: Secondary | ICD-10-CM | POA: Diagnosis not present

## 2019-08-06 DIAGNOSIS — L905 Scar conditions and fibrosis of skin: Secondary | ICD-10-CM

## 2019-08-06 NOTE — Progress Notes (Addendum)
   Follow up Visit  Subjective  Brett [name] is a 74 y.o. male who presents for the following: Follow-up (Patient here to follow-up for 5FU use on face.  States that he was able to tolerate as directed.  Right temple had the best reaction with irritation and flaking.  Nothing bothering him currently.). The left forehead mild reaction with mild redness where you could tell it was being applied. The right temple reacted briskly and became an open wound. Feels fine now on right temple and no longer feels lesions on forehead.  Objective  Well appearing patient in no apparent distress; mood and affect are within normal limits.  Face examined. Relevant physical exam findings are noted in the Assessment and Plan. Surgical scar examined no sign of recurrence right temple. Peeling area noted right cheek post 5FU treatment.    Objective  Head - Anterior (Face): Dyspigmented scar. No recurrence noted.   Assessment & Plan  History of SCC in situ Right Temple continue to monitor for scale. AKs on forehead seem to be resolved post Efudex treatment. If right cheek is still scaling 3-4 weeks from now do one more week of qhs 5FU. Otherwise follow up in the fall.  I, Arlyss Gandy, PA-C, have reviewed all documentation for this visit. The documentation on 09/14/19 for the exam, diagnosis, procedures, and orders are all accurate and complete.

## 2019-08-09 ENCOUNTER — Encounter: Payer: Self-pay | Admitting: Gastroenterology

## 2019-08-09 ENCOUNTER — Other Ambulatory Visit: Payer: Self-pay

## 2019-08-09 ENCOUNTER — Ambulatory Visit (AMBULATORY_SURGERY_CENTER): Payer: Medicare HMO | Admitting: Gastroenterology

## 2019-08-09 VITALS — BP 126/53 | HR 67 | Temp 96.4°F | Resp 19 | Ht 68.0 in | Wt 191.0 lb

## 2019-08-09 DIAGNOSIS — K5730 Diverticulosis of large intestine without perforation or abscess without bleeding: Secondary | ICD-10-CM | POA: Diagnosis not present

## 2019-08-09 DIAGNOSIS — K3189 Other diseases of stomach and duodenum: Secondary | ICD-10-CM

## 2019-08-09 DIAGNOSIS — K529 Noninfective gastroenteritis and colitis, unspecified: Secondary | ICD-10-CM

## 2019-08-09 DIAGNOSIS — K219 Gastro-esophageal reflux disease without esophagitis: Secondary | ICD-10-CM | POA: Diagnosis not present

## 2019-08-09 DIAGNOSIS — K449 Diaphragmatic hernia without obstruction or gangrene: Secondary | ICD-10-CM

## 2019-08-09 DIAGNOSIS — K6289 Other specified diseases of anus and rectum: Secondary | ICD-10-CM | POA: Diagnosis not present

## 2019-08-09 DIAGNOSIS — D509 Iron deficiency anemia, unspecified: Secondary | ICD-10-CM | POA: Diagnosis present

## 2019-08-09 DIAGNOSIS — K2950 Unspecified chronic gastritis without bleeding: Secondary | ICD-10-CM | POA: Diagnosis not present

## 2019-08-09 DIAGNOSIS — K317 Polyp of stomach and duodenum: Secondary | ICD-10-CM

## 2019-08-09 MED ORDER — SODIUM CHLORIDE 0.9 % IV SOLN: 500.0000 mL | Freq: Once | INTRAVENOUS | Status: DC

## 2019-08-09 NOTE — Progress Notes (Signed)
Called to room to assist during endoscopic procedure.  Patient ID and intended procedure confirmed with present staff. Received instructions for my participation in the procedure from the performing physician.  

## 2019-08-09 NOTE — Progress Notes (Signed)
Report given to PACU, vss 

## 2019-08-09 NOTE — Op Note (Signed)
[facility] Patient Name: Brett [name] Procedure Date: 08/09/2019 7:26 AM MRN: [id number] Endoscopist: Viviann Spare P. Adela Lank , MD Age: 74 Referring MD:  Date of Birth: 21-May-1945 Gender: Male Account #: [id number] Procedure:                Colonoscopy Indications:              Iron deficiency anemia, also with chronic loose                            stools managed with colestid / cholestyramine most                            recently Medicines:                Monitored Anesthesia Care Procedure:                Pre-Anesthesia Assessment:                           - Prior to the procedure, a History and Physical                            was performed, and patient medications and                            allergies were reviewed. The patient's tolerance of                            previous anesthesia was also reviewed. The risks                            and benefits of the procedure and the sedation                            options and risks were discussed with the patient.                            All questions were answered, and informed consent                            was obtained. Prior Anticoagulants: The patient has                            taken no previous anticoagulant or antiplatelet                            agents. ASA Grade Assessment: II - A patient with                            mild systemic disease. After reviewing the risks                            and benefits, the patient was deemed in  satisfactory condition to undergo the procedure.                           After obtaining informed consent, the colonoscope                            was passed under direct vision. Throughout the                            procedure, the patient's blood pressure, pulse, and                            oxygen saturations were monitored continuously. The                            Colonoscope was introduced through the anus and                             advanced to the the terminal ileum, with                            identification of the appendiceal orifice and IC                            valve. The colonoscopy was performed without                            difficulty. The patient tolerated the procedure                            well. The quality of the bowel preparation was                            good. The terminal ileum, ileocecal valve,                            appendiceal orifice, and rectum were photographed. Scope In: 8:18:43 AM Scope Out: 8:32:26 AM Scope Withdrawal Time: 0 hours 11 minutes 13 seconds  Total Procedure Duration: 0 hours 13 minutes 43 seconds  Findings:                 The perianal and digital rectal examinations were                            normal.                           The terminal ileum appeared normal.                           Multiple small-mouthed diverticula were found in                            the sigmoid colon.  Anal papilla(e) were hypertrophied.                           Internal hemorrhoids were found during                            retroflexion. The hemorrhoids were small.                           The exam was otherwise without abnormality.                           Biopsies for histology were taken with a cold                            forceps from the right colon, left colon and                            transverse colon for evaluation of microscopic                            colitis. Complications:            No immediate complications. Estimated blood loss:                            Minimal. Estimated Blood Loss:     Estimated blood loss was minimal. Impression:               - The examined portion of the ileum was normal.                           - Diverticulosis in the sigmoid colon.                           - Anal papilla(e) were hypertrophied.                           - Internal hemorrhoids.                            - The examination was otherwise normal.                           - Biopsies were taken with a cold forceps from the                            right colon, left colon and transverse colon for                            evaluation of microscopic colitis.                           No cause for iron deficiency noted on colonoscopy.                            No  polyps Recommendation:           - Patient has a contact number available for                            emergencies. The signs and symptoms of potential                            delayed complications were discussed with the                            patient. Return to normal activities tomorrow.                            Written discharge instructions were provided to the                            patient.                           - Resume previous diet.                           - Continue present medications.                           - Switch cholesytramine to colestipol HCL 1gm BID                            PRN for diarrhea (he reports this works better than                            cholestyramine)                           - Await pathology results.                           - No further colonoscopy screening is warranted                            given the results of this exam Viviann Spare P. Armbruster, MD 08/09/2019 8:37:56 AM This report has been signed electronically.

## 2019-08-09 NOTE — Patient Instructions (Signed)
Thank you for letting us take care of your healthcare needs today. See handouts given to you on Hemorrhoids, Diverticulitis, Gastritis and Hiatal Hernia. Increase omeprazole to twice daily and go back to taking the Colestipol instead of the cholestyramine.     YOU HAD AN ENDOSCOPIC PROCEDURE TODAY AT [facility]:   Refer to the procedure report that was given to you for any specific questions about what was found during the examination.  If the procedure report does not answer your questions, please call your gastroenterologist to clarify.  If you requested that your care partner not be given the details of your procedure findings, then the procedure report has been included in a sealed envelope for you to review at your convenience later.  YOU SHOULD EXPECT: Some feelings of bloating in the abdomen. Passage of more gas than usual.  Walking can help get rid of the air that was put into your GI tract during the procedure and reduce the bloating. If you had a lower endoscopy (such as a colonoscopy or flexible sigmoidoscopy) you may notice spotting of blood in your stool or on the toilet paper. If you underwent a bowel prep for your procedure, you may not have a normal bowel movement for a few days.  Please Note:  You might notice some irritation and congestion in your nose or some drainage.  This is from the oxygen used during your procedure.  There is no need for concern and it should clear up in a day or so.  SYMPTOMS TO REPORT IMMEDIATELY:   Following lower endoscopy (colonoscopy or flexible sigmoidoscopy):  Excessive amounts of blood in the stool  Significant tenderness or worsening of abdominal pains  Swelling of the abdomen that is new, acute  Fever of 100F or higher   Following upper endoscopy (EGD)  Vomiting of blood or coffee ground material  New chest pain or pain under the shoulder blades  Painful or persistently difficult swallowing  New shortness of  breath  Fever of 100F or higher  Black, tarry-looking stools  For urgent or emergent issues, a gastroenterologist can be reached at any hour by calling [phone]. Do not use MyChart messaging for urgent concerns.    DIET:  We do recommend a small meal at first, but then you may proceed to your regular diet.  Drink plenty of fluids but you should avoid alcoholic beverages for 24 hours.  ACTIVITY:  You should plan to take it easy for the rest of today and you should NOT DRIVE or use heavy machinery until tomorrow (because of the sedation medicines used during the test).    FOLLOW UP: Our staff will call the number listed on your records 48-72 hours following your procedure to check on you and address any questions or concerns that you may have regarding the information given to you following your procedure. If we do not reach you, we will leave a message.  We will attempt to reach you two times.  During this call, we will ask if you have developed any symptoms of COVID 19. If you develop any symptoms (ie: fever, flu-like symptoms, shortness of breath, cough etc.) before then, please call [phone].  If you test positive for Covid 19 in the 2 weeks post procedure, please call and report this information to [place].    If any biopsies were taken you will be contacted by phone or by letter within the next 1-3 weeks.  Please call us at [phone] if  you have not heard about the biopsies in 3 weeks.    SIGNATURES/CONFIDENTIALITY: You and/or your care partner have signed paperwork which will be entered into your electronic medical record.  These signatures attest to the fact that that the information above on your After Visit Summary has been reviewed and is understood.  Full responsibility of the confidentiality of this discharge information lies with you and/or your care-partner.

## 2019-08-09 NOTE — Progress Notes (Signed)
Spoke with patient about DNR status, patient agreed to stop DNR status during procedure (EGD and Colonoscopy).  Patient was upset that no one had spoke with about stopping DNR status and stated he would speak with Dr Havery Moros.

## 2019-08-09 NOTE — Progress Notes (Signed)
Temp by JB, vitals by CW 

## 2019-08-09 NOTE — Op Note (Signed)
[facility] Patient Name: Brett [name] Procedure Date: 08/09/2019 7:26 AM MRN: [id number] Endoscopist: Viviann Spare P. Adela Lank , MD Age: 74 Referring MD:  Date of Birth: 1945/07/01 Gender: Male Account #: [id number] Procedure:                Upper GI endoscopy Indications:              Iron deficiency anemiam also with chronic loose                            stools Medicines:                Monitored Anesthesia Care Procedure:                Pre-Anesthesia Assessment:                           - Prior to the procedure, a History and Physical                            was performed, and patient medications and                            allergies were reviewed. The patient's tolerance of                            previous anesthesia was also reviewed. The risks                            and benefits of the procedure and the sedation                            options and risks were discussed with the patient.                            All questions were answered, and informed consent                            was obtained. Prior Anticoagulants: The patient has                            taken no previous anticoagulant or antiplatelet                            agents. ASA Grade Assessment: II - A patient with                            mild systemic disease. After reviewing the risks                            and benefits, the patient was deemed in                            satisfactory condition to undergo the procedure.  After obtaining informed consent, the endoscope was                            passed under direct vision. Throughout the                            procedure, the patient's blood pressure, pulse, and                            oxygen saturations were monitored continuously. The                            Endoscope was introduced through the mouth, and                            advanced to the second part of duodenum. The  upper                            GI endoscopy was accomplished without difficulty.                            The patient tolerated the procedure well. Scope In: Scope Out: Findings:                 Esophagogastric landmarks were identified: the                            Z-line was found at 40 cm, the gastroesophageal                            junction was found at 40 cm and the upper extent of                            the gastric folds was found at 41 cm from the                            incisors.                           A 1 cm hiatal hernia was present.                           The exam of the esophagus was otherwise normal.                           Two 3 mm sessile polyps were found in the gastric                            fundus. These polyps were removed with a cold                            biopsy forceps. Resection and retrieval were  complete.                           Patchy mild inflammation characterized by erythema                            and friability was found in the gastric body                            without focal ulcerations.                           The exam of the stomach was otherwise normal.                           Biopsies were taken with a cold forceps in the                            gastric body, at the incisura and in the gastric                            antrum for Helicobacter pylori testing.                           The duodenal bulb and second portion of the                            duodenum were normal. Biopsies for histology were                            taken with a cold forceps for evaluation of celiac                            disease. Complications:            No immediate complications. Estimated blood loss:                            Minimal. Estimated Blood Loss:     Estimated blood loss was minimal. Impression:               - Esophagogastric landmarks identified.                            - 1 cm hiatal hernia.                           - Normal esophagus otherwise                           - Two small benign gastric polyps. Resected and                            retrieved.                           - Gastritis.                           -  Normal stomach otherwise - biopsies taken to rule                            out H pylori                           - Normal duodenal bulb and second portion of the                            duodenum. Biopsied.                           It's possible gastritis could be related to iron                            deficiency, will await biopsies. Recommendation:           - Patient has a contact number available for                            emergencies. The signs and symptoms of potential                            delayed complications were discussed with the                            patient. Return to normal activities tomorrow.                            Written discharge instructions were provided to the                            patient.                           - Resume previous diet.                           - Continue present medications.                           - Increase omeprazole to twice daily to treat                            gastritis, minimize NSAID use                           - Await pathology results. Viviann Spare P. Armbruster, MD 08/09/2019 8:42:41 AM This report has been signed electronically.

## 2019-08-13 ENCOUNTER — Telehealth: Payer: Self-pay

## 2019-08-13 NOTE — Telephone Encounter (Signed)
  Follow up Call-  Call back number 08/09/2019  Post procedure Call Back phone  # [phone]  Permission to leave phone message Yes  Some recent data might be hidden     Patient questions:  Do you have a fever, pain , or abdominal swelling? No. Pain Score  0 *  Have you tolerated food without any problems? Yes.    Have you been able to return to your normal activities? Yes.    Do you have any questions about your discharge instructions: Diet   No. Medications  No. Follow up visit  No.  Do you have questions or concerns about your Care? No.  Actions: * If pain score is 4 or above: No action needed, pain <4.  1. Have you developed a fever since your procedure? no  2.   Have you had an respiratory symptoms (SOB or cough) since your procedure? no  3.   Have you tested positive for COVID 19 since your procedure no  4.   Have you had any family members/close contacts diagnosed with the COVID 19 since your procedure?  no   If yes to any of these questions please route to Joylene John, RN and Erenest Rasher, RN

## 2019-08-17 ENCOUNTER — Other Ambulatory Visit: Payer: Self-pay | Admitting: Family Medicine

## 2019-08-23 ENCOUNTER — Telehealth: Payer: Self-pay | Admitting: *Deleted

## 2019-08-23 MED ORDER — OMEPRAZOLE 20 MG PO CPDR: 20.0000 mg | DELAYED_RELEASE_CAPSULE | Freq: Two times a day (BID) | ORAL | 3 refills | Status: DC

## 2019-08-23 NOTE — Telephone Encounter (Signed)
Received request from pharmacy for PA on Omeprazole BID.  PA submitted via telephone.   Dx: K21.9- GERD.  Received immediate determination.   PA [id number] approved 07/24/2019- 08/22/2022.  Pharmacy made aware.

## 2019-09-18 ENCOUNTER — Telehealth: Payer: Self-pay

## 2019-09-18 ENCOUNTER — Other Ambulatory Visit: Payer: Self-pay

## 2019-09-18 DIAGNOSIS — D509 Iron deficiency anemia, unspecified: Secondary | ICD-10-CM

## 2019-09-18 NOTE — Telephone Encounter (Signed)
-----   Message from Roetta Sessions, [place] sent at 08/16/2019  9:30 AM EDT ----- Regarding: labs due  Pt due for TIBC / ferritin panel around May 13

## 2019-09-18 NOTE — Telephone Encounter (Signed)
Called and spoke to pt.  He is due for labs.  He is seeing Nevin Bloodgood next week on 09-25-19 (he moved his appt up)  and can have his las done at that time. Confirmed appt.

## 2019-09-18 NOTE — Progress Notes (Signed)
Orders for IBC and Ferritin panel entered.

## 2019-09-25 ENCOUNTER — Encounter: Payer: Self-pay | Admitting: Nurse Practitioner

## 2019-09-25 ENCOUNTER — Other Ambulatory Visit: Payer: Self-pay | Admitting: Family Medicine

## 2019-09-25 ENCOUNTER — Ambulatory Visit (INDEPENDENT_AMBULATORY_CARE_PROVIDER_SITE_OTHER): Payer: Medicare HMO | Admitting: Nurse Practitioner

## 2019-09-25 ENCOUNTER — Other Ambulatory Visit (INDEPENDENT_AMBULATORY_CARE_PROVIDER_SITE_OTHER): Payer: Medicare HMO

## 2019-09-25 VITALS — BP 114/60 | HR 64 | Temp 97.6°F | Ht 68.0 in | Wt 182.6 lb

## 2019-09-25 DIAGNOSIS — K219 Gastro-esophageal reflux disease without esophagitis: Secondary | ICD-10-CM

## 2019-09-25 DIAGNOSIS — D509 Iron deficiency anemia, unspecified: Secondary | ICD-10-CM

## 2019-09-25 DIAGNOSIS — R490 Dysphonia: Secondary | ICD-10-CM

## 2019-09-25 LAB — IBC + FERRITIN
Ferritin: 29.4 ng/mL (ref 22.0–322.0)
Iron: 69 ug/dL (ref 42–165)
Saturation Ratios: 15.3 % — ABNORMAL LOW (ref 20.0–50.0)
Transferrin: 323 mg/dL (ref 212.0–360.0)

## 2019-09-25 MED ORDER — SUCRALFATE 1 GM/10ML PO SUSP
1.0000 g | Freq: Four times a day (QID) | ORAL | 1 refills | Status: DC
Start: 2019-09-25 — End: 2019-11-07

## 2019-09-25 MED ORDER — SUCRALFATE 1 G PO TABS: 1.0000 g | ORAL_TABLET | Freq: Three times a day (TID) | ORAL | 1 refills | Status: DC

## 2019-09-25 MED ORDER — DEXLANSOPRAZOLE 60 MG PO CPDR
60.0000 mg | DELAYED_RELEASE_CAPSULE | Freq: Every day | ORAL | 3 refills | Status: DC
Start: 2019-09-25 — End: 2019-10-22

## 2019-09-25 NOTE — Patient Instructions (Addendum)
If you are age 73 or older, your body mass index should be between 23-30. Your Body mass index is 27.76 kg/m. If this is out of the aforementioned range listed, please consider follow up with your Primary Care Provider.  If you are age 73 or younger, your body mass index should be between 19-25. Your Body mass index is 27.76 kg/m. If this is out of the aformentioned range listed, please consider follow up with your Primary Care Provider.   STOP Omeprazole  START Dexilant 60 mg 1 capsule every morning.   START Carafate 1 gm three times a day before meal and at bedtime.   Both medications have been sent to your local pharmacy.  Follow up pending you lab results and as needed.

## 2019-09-25 NOTE — Progress Notes (Signed)
Agree with assessment and plan as outlined.  

## 2019-09-25 NOTE — Progress Notes (Signed)
IMPRESSION and PLAN:    74 year old male with PMH significant for diverticulosis, GERD with esophagitis, IBS, hyperlipidemia, sleep apnea     # Chronic loose stool since cholecystectomy --Resolved with once daily Colestid and recent addition of oral iron for iron deficiency --Occasionally has constipation now.  Hopefully this will resolve when he no longer needs oral iron.  In the interim he can try holding the Colestid if gets constipated.  He knows to take MiraLAX if needed  # IDA.  --EGD March 2021 remarkable for non-H. pylori gastritis.  Possibly diclofenac cause of gastritis, patient has discontinued it --Small bowel biopsies negative for celiac.   --No polyps on colonoscopy March 2021 --Last hemoglobin drawn 07/31/2019 was normal at 13.5  --He had recommended follow-up iron studies drawn earlier this a.m.   # GERD with severe pyrosis --Prior to EGD he was asymptomatic on once daily omeprazole.  Since procedure he has had terrible heartburn, occasional regurgitation and frequent hoarse voice despite increasing PPI to twice daily.  He is taking a.m. dose before breakfast and second dose at bedtime.  Goes to bed on an empty stomach and has tried elevating the head of bed without improvement.  --Etiology of symptoms unclear, his EGD was unremarkable. He had a hiatal hernia but no esophagitis or other esophageal findings.  --Discontinue Prilosec.  Trial of Dexilant 60 mg in the morning.  --If insurance will not cover Dexilant then try Protonix 40 mg twice daily 30 minutes before meals.  --Will also add Carafate liquid 1 g before meals and at bedtime spaced out a couple of hours in between other medication.  I explained that the liquid form may be expensive. Should this be the case then call the office and I am happy to change to tablet  HPI:    Primary GI: Dr. Havery Moros  Chief complaint : Terrible heartburn and hoarseness  Patient is a 74 year old male who recently  underwent EGD and colonoscopy for evaluation of iron deficiency anemia and chronic loose stool.  EGD remarkable for 1 cm hiatal hernia, esophagus otherwise normal.  He had a couple of gastric polyps and gastritis.  Biopsies compatible with reactive gastropathy, no H. pylori.  Diclofenac may have been the culprit.  Patient has since stopped diclofenac.  His colonoscopy was markable for diverticulosis and hemorrhoids.  No polyps  Patient is here with complaints of terrible pyrosis and frequent hoarseness since EGD.  Prior to the procedure his symptoms were controlled with once daily omeprazole.  Since the procedure he has had terrible heartburn, occasional regurgitation.  His voice is frequently hoarse.  He has actually been on twice daily omeprazole since a day or 2 after the procedure.  He has tried going to bed on an empty stomach, elevating the head of his bed but nothing is helping and he has been dealing with this for several weeks.  Couple of weeks ago he had sore throat and felt like food was getting stuck in his esophagus but those symptoms have resolved  Regarding chronic loose stool, it has resolved with daily Colestid and recent addition of oral iron.  In fact he occasionally has constipation.    Previous Endoscopic Evaluations: EGD 08/09/19 EGD for IDA and loose stool  -esophagogastric landmarks identified. - 1 cm hiatal hernia. - Normal esophagus otherwise - Two small benign gastric polyps. Resected and retrieved. - Gastritis. - Normal stomach otherwise - biopsies taken to rule out H pylori -  Normal duodenal bulb and second portion of the duodenum. Biopsied  Colonoscopy 08/09/2019 for IDA and loose stool -Complete exam, good prep -Diverticulosis and internal hemorrhoids  Surgical [P], duodenal bulb - BENIGN SMALL BOWEL MUCOSA. - NO VILLOUS BLUNTING OR INCREASE IN INTRAEPITHELIAL LYMPHOCYTES. - NO DYSPLASIA OR MALIGNANCY. 2. Surgical [P], gastric antrum - MILD REACTIVE  GASTROPATHY. Hinton Dyer IS NEGATIVE FOR HELICOBACTER PYLORI. - NO INTESTINAL METAPLASIA, DYSPLASIA, OR MALIGNANCY. 3. Surgical [P], gastric antrum, polyp - HYPERPLASTIC POLYP. - WARTHIN-STARRY IS NEGATIVE FOR HELICOBACTER PYLORI. - NO INTESTINAL METAPLASIA, DYSPLASIA, OR MALIGNANCY. 4. Surgical [P], random sites - BENIGN COLONIC MUCOSA. - NO ACTIVE INFLAMMATION OR EVIDENCE OF MICROSCOPIC COLITIS. - NO DYSPLASIA OR MALIGNANCY.  Review of systems:     No chest pain, no SOB, no fevers, no urinary sx   Past Medical History:  Diagnosis Date  . Allergy    seasonal  . Anemia 06/2019   iron deficiency  . Arthritis   . Cataract   . Diverticulosis   . GERD (gastroesophageal reflux disease)   . Hiatal hernia 2014   Dr. Lyda Perone [facility])  . History of hiatal hernia   . Hyperlipidemia   . Hypertension   . IBS (irritable colon syndrome)   . Pneumonia    hx. of  . PONV (postoperative nausea and vomiting)    with gallbladder surgery  . SCC (squamous cell carcinoma) 01/02/2019   R [id number]  . Sleep apnea    cpap  . Vertigo     Patient's surgical history, family medical history, social history, medications and allergies were all reviewed in Epic   Creatinine clearance cannot be calculated (Patient's most recent lab result is older than the maximum 21 days allowed.)  Current Outpatient Medications  Medication Sig Dispense Refill  . amLODipine (NORVASC) 10 MG tablet TAKE 1 TABLET DAILY 90 tablet 3  . cholestyramine (QUESTRAN) 4 g packet Take 0.5-1 packets (2-4 g total) by mouth 2 (two) times daily. Titrate as needed 180 each 1  . clindamycin (CLEOCIN) 300 MG capsule Take 300 mg by mouth 3 (three) times daily.    . colestipol (COLESTID) 1 g tablet Take 1 tablet (1 g total) by mouth 2 (two) times daily. 180 tablet 0  . diclofenac (VOLTAREN) 75 MG EC tablet Take 1 tablet (75 mg total) by mouth 2 (two) times daily. 180 tablet 2  . fenofibrate (TRICOR) 48 MG tablet TAKE 1 TABLET  DAILY 90 tablet 3  . ferrous sulfate 325 (65 FE) MG tablet Take 325 mg by mouth daily with breakfast.    . losartan (COZAAR) 25 MG tablet TAKE 1 TABLET DAILY 90 tablet 3  . montelukast (SINGULAIR) 10 MG tablet TAKE 1 TABLET AT BEDTIME 90 tablet 3  . omeprazole (PRILOSEC) 20 MG capsule Take 1 capsule (20 mg total) by mouth 2 (two) times daily before a meal. 180 capsule 3  . tamsulosin (FLOMAX) 0.4 MG CAPS capsule TAKE 1 CAPSULE DAILY 90 capsule 3  . Triamcinolone Acetonide (NASACORT ALLERGY 24HR NA)      No current facility-administered medications for this visit.    Physical Exam:     BP 114/60   Pulse 64   Temp 97.6 F (36.4 C)   Ht 5\' 8"  (1.727 m)   Wt 182 lb 9.6 oz (82.8 kg)   BMI 27.76 kg/m   GENERAL: Developed male in NAD PSYCH: : Cooperative, normal affect CARDIAC:  RRR,  PULM: Normal respiratory effort, lungs CTA bilaterally, no wheezing ABDOMEN:  Nondistended,  soft, nontender. No obvious masses, no hepatomegaly,  normal bowel sounds SKIN:  turgor, no lesions seen Musculoskeletal:  Normal muscle tone, normal strength NEURO: Alert and oriented x 3, no focal neurologic deficits   Tye Savoy , NP 09/25/2019, 10:13 AM

## 2019-09-26 ENCOUNTER — Other Ambulatory Visit: Payer: Self-pay

## 2019-09-26 DIAGNOSIS — D509 Iron deficiency anemia, unspecified: Secondary | ICD-10-CM

## 2019-09-27 ENCOUNTER — Other Ambulatory Visit (INDEPENDENT_AMBULATORY_CARE_PROVIDER_SITE_OTHER): Payer: Medicare HMO

## 2019-09-27 DIAGNOSIS — D509 Iron deficiency anemia, unspecified: Secondary | ICD-10-CM

## 2019-09-27 LAB — CBC WITH DIFFERENTIAL/PLATELET
Basophils Absolute: 0.1 10*3/uL (ref 0.0–0.1)
Basophils Relative: 0.7 % (ref 0.0–3.0)
Eosinophils Absolute: 0.1 10*3/uL (ref 0.0–0.7)
Eosinophils Relative: 1.7 % (ref 0.0–5.0)
HCT: 37.7 % — ABNORMAL LOW (ref 39.0–52.0)
Hemoglobin: 12.7 g/dL — ABNORMAL LOW (ref 13.0–17.0)
Lymphocytes Relative: 23.5 % (ref 12.0–46.0)
Lymphs Abs: 1.9 10*3/uL (ref 0.7–4.0)
MCHC: 33.7 g/dL (ref 30.0–36.0)
MCV: 83.4 fl (ref 78.0–100.0)
Monocytes Absolute: 0.4 10*3/uL (ref 0.1–1.0)
Monocytes Relative: 5 % (ref 3.0–12.0)
Neutro Abs: 5.6 10*3/uL (ref 1.4–7.7)
Neutrophils Relative %: 69.1 % (ref 43.0–77.0)
Platelets: 384 10*3/uL (ref 150.0–400.0)
RBC: 4.52 Mil/uL (ref 4.22–5.81)
RDW: 13.4 % (ref 11.5–15.5)
WBC: 8.1 10*3/uL (ref 4.0–10.5)

## 2019-10-02 ENCOUNTER — Other Ambulatory Visit: Payer: Self-pay

## 2019-10-02 DIAGNOSIS — D509 Iron deficiency anemia, unspecified: Secondary | ICD-10-CM

## 2019-10-07 ENCOUNTER — Other Ambulatory Visit: Payer: Self-pay | Admitting: Family Medicine

## 2019-10-10 ENCOUNTER — Ambulatory Visit: Payer: Medicare HMO | Admitting: Gastroenterology

## 2019-10-22 ENCOUNTER — Other Ambulatory Visit: Payer: Self-pay

## 2019-10-22 MED ORDER — DEXLANSOPRAZOLE 60 MG PO CPDR: 60.0000 mg | DELAYED_RELEASE_CAPSULE | Freq: Every day | ORAL | 3 refills | Status: DC

## 2019-10-31 ENCOUNTER — Other Ambulatory Visit (INDEPENDENT_AMBULATORY_CARE_PROVIDER_SITE_OTHER): Payer: Medicare HMO

## 2019-10-31 DIAGNOSIS — D509 Iron deficiency anemia, unspecified: Secondary | ICD-10-CM | POA: Diagnosis not present

## 2019-10-31 LAB — CBC
HCT: 41.5 % (ref 39.0–52.0)
Hemoglobin: 13.8 g/dL (ref 13.0–17.0)
MCHC: 33.3 g/dL (ref 30.0–36.0)
MCV: 84.1 fl (ref 78.0–100.0)
Platelets: 290 10*3/uL (ref 150.0–400.0)
RBC: 4.94 Mil/uL (ref 4.22–5.81)
RDW: 13.7 % (ref 11.5–15.5)
WBC: 5.7 10*3/uL (ref 4.0–10.5)

## 2019-10-31 LAB — IBC + FERRITIN
Ferritin: 31.5 ng/mL (ref 22.0–322.0)
Iron: 97 ug/dL (ref 42–165)
Saturation Ratios: 20.5 % (ref 20.0–50.0)
Transferrin: 338 mg/dL (ref 212.0–360.0)

## 2019-10-31 LAB — VITAMIN B12: Vitamin B-12: 270 pg/mL (ref 211–911)

## 2019-11-02 ENCOUNTER — Other Ambulatory Visit: Payer: Self-pay

## 2019-11-02 MED ORDER — PANTOPRAZOLE SODIUM 40 MG PO TBEC
40.0000 mg | DELAYED_RELEASE_TABLET | Freq: Two times a day (BID) | ORAL | 3 refills | Status: DC
Start: 2019-11-02 — End: 2020-04-28

## 2019-11-07 ENCOUNTER — Ambulatory Visit (INDEPENDENT_AMBULATORY_CARE_PROVIDER_SITE_OTHER): Payer: Medicare HMO | Admitting: Family Medicine

## 2019-11-07 ENCOUNTER — Other Ambulatory Visit: Payer: Self-pay

## 2019-11-07 ENCOUNTER — Encounter: Payer: Self-pay | Admitting: Family Medicine

## 2019-11-07 VITALS — BP 116/68 | HR 90 | Temp 98.4°F | Resp 14 | Ht 68.0 in | Wt 183.0 lb

## 2019-11-07 DIAGNOSIS — R1031 Right lower quadrant pain: Secondary | ICD-10-CM

## 2019-11-07 DIAGNOSIS — M5030 Other cervical disc degeneration, unspecified cervical region: Secondary | ICD-10-CM

## 2019-11-07 DIAGNOSIS — G542 Cervical root disorders, not elsewhere classified: Secondary | ICD-10-CM

## 2019-11-07 DIAGNOSIS — Z96641 Presence of right artificial hip joint: Secondary | ICD-10-CM

## 2019-11-07 DIAGNOSIS — M791 Myalgia, unspecified site: Secondary | ICD-10-CM

## 2019-11-07 NOTE — Patient Instructions (Signed)
MRI to be done C spine  Xray of hip We will call with lab results F/U as previous

## 2019-11-07 NOTE — Assessment & Plan Note (Signed)
DDD that is  Progressive. He also had some Foraminal stenosis and disc bulge in the past.  My concern is that he has nerve impingement from the stenosis.  Can obtain an MRI of the cervical spine.  He is getting some weakness in the left upper extremity and has already had shoulder pathology ruled out.  For his hip pain he does have history of right hip replacement.  Will obtain an x-ray of his hips make sure everything is intact.  Will defer to his orthopedic Dr. Lyla Glassing.  For the myalgias in general I will go ahead and check Lyme antibodies we will also check a CK as he is on cholesterol medication.

## 2019-11-07 NOTE — Progress Notes (Signed)
Subjective:    Patient ID: Brett [name], male    DOB: 1945-12-24, 74 y.o.   MRN: [id number]  Patient presents for Muscle Pain (pain in R leg, L arm, L hand, B shoulder - has had multiple tick bites- had x-rays from Ortho with no issues noted)   Pt here with muscle pain and joint pain. He is concerned about tick borne illness as well due to multiple ticks being removed, No joint swelling, no change in energy level, no rash, no fever      His Right inner thigh pain, when he turns certain directions it feels like he is pulling the muscle.  He denies any hip pain.  However when he inverts his leg he has more discomfort in the groin.     He has pain in the left in tricep region that radiates around. Told not the shoulder joint by Dr. Ranell Patrick  He does havev history of pinched in nerve in neck  LEft hands gets numb gets numb and tingles sometimes feels weaker. He will feel a sharp pain that radiates from shoulder blade or neck down his arm   CT 2009 showed disc bulge, DDD, foraminal stenosis      Review Of Systems:  GEN- denies fatigue, fever, weight loss,weakness, recent illness HEENT- denies eye drainage, change in vision, nasal discharge, CVS- denies chest pain, palpitations RESP- denies SOB, cough, wheeze ABD- denies N/V, change in stools, abd pain GU- denies dysuria, hematuria, dribbling, incontinence MSK- + joint pain,+ muscle aches, injury Neuro- denies headache, dizziness, syncope, seizure activity       Objective:    BP 116/68   Pulse 90   Temp 98.4 F (36.9 C) (Temporal)   Resp 14   Ht 5\' 8"  (1.727 m)   Wt 183 lb (83 kg)   SpO2 96%   BMI 27.83 kg/m  GEN- NAD, alert and oriented x3 HEENT- PERRL, EOMI, non injected sclera, pink conjunctiva, MMM, oropharynx clear Neck- Supple, fair ROM C spine nt, NEG Sp[place]ings  CVS- RRR, no murmur RESP-CTAB MSK- Rotator cuff int act, biceps in tact, pain with extension upper left arm  grasp equal bilat,  Lumbar spine NT, Fair  ROM spine/hips , pain with IR right side in groin/thigh  neg SLR   Neuro- sensation in tact UE , decreased strength LUE compared to right, tone normal    EXT- No edema Pulses- Radial, 2+        Assessment & Plan:      Problem List Items Addressed This Visit      Unprioritized   DDD (degenerative disc disease), cervical - Primary    DDD that is  Progressive. He also had some Foraminal stenosis and disc bulge in the past.  My concern is that he has nerve impingement from the stenosis.  Can obtain an MRI of the cervical spine.  He is getting some weakness in the left upper extremity and has already had shoulder pathology ruled out.  For his hip pain he does have history of right hip replacement.  Will obtain an x-ray of his hips make sure everything is intact.  Will defer to his orthopedic Dr. Linna Caprice.  For the myalgias in general I will go ahead and check Lyme antibodies we will also check a CK as he is on cholesterol medication.      Relevant Orders   MR Cervical Spine Wo Contrast    Other Visit Diagnoses    Cervical nerve root impingement  Relevant Orders   MR Cervical Spine Wo Contrast   Right groin pain       Relevant Orders   DG HIPS BILAT WITH PELVIS 3-4 VIEWS   History of right hip replacement       Relevant Orders   DG HIPS BILAT WITH PELVIS 3-4 VIEWS   Other cervical disc degeneration, unspecified cervical region       Relevant Orders   MR Cervical Spine Wo Contrast   Myalgia       Relevant Orders   Comprehensive metabolic panel   CK   B. burgdorfi antibodies      Note: This dictation was prepared with Dragon dictation along with smaller phrase technology. Any transcriptional errors that result from this process are unintentional.

## 2019-11-08 ENCOUNTER — Encounter: Payer: Self-pay | Admitting: Family Medicine

## 2019-11-08 LAB — COMPREHENSIVE METABOLIC PANEL
AG Ratio: 1.8 (calc) (ref 1.0–2.5)
ALT: 10 U/L (ref 9–46)
AST: 10 U/L (ref 10–35)
Albumin: 4.1 g/dL (ref 3.6–5.1)
Alkaline phosphatase (APISO): 42 U/L (ref 35–144)
BUN: 12 mg/dL (ref 7–25)
CO2: 30 mmol/L (ref 20–32)
Calcium: 8.9 mg/dL (ref 8.6–10.3)
Chloride: 103 mmol/L (ref 98–110)
Creat: 0.92 mg/dL (ref 0.70–1.18)
Globulin: 2.3 g/dL (calc) (ref 1.9–3.7)
Glucose, Bld: 128 mg/dL — ABNORMAL HIGH (ref 65–99)
Potassium: 4.2 mmol/L (ref 3.5–5.3)
Sodium: 139 mmol/L (ref 135–146)
Total Bilirubin: 0.4 mg/dL (ref 0.2–1.2)
Total Protein: 6.4 g/dL (ref 6.1–8.1)

## 2019-11-08 LAB — CK: Total CK: 47 U/L (ref 44–196)

## 2019-11-08 LAB — B. BURGDORFI ANTIBODIES: B burgdorferi Ab IgG+IgM: <0.9 index

## 2019-11-08 LAB — EXTRA LAV TOP TUBE

## 2019-11-21 ENCOUNTER — Encounter: Payer: Self-pay | Admitting: Family Medicine

## 2019-11-28 ENCOUNTER — Encounter: Payer: Self-pay | Admitting: Family Medicine

## 2019-11-28 MED ORDER — ZOLPIDEM TARTRATE 10 MG PO TABS: 10.0000 mg | ORAL_TABLET | Freq: Every evening | ORAL | 1 refills | Status: DC | PRN

## 2019-11-28 NOTE — Telephone Encounter (Signed)
Ok to refill??  Last office visit 11/07/2019.  Last refill 04/29/2017, #1 refill.

## 2019-12-07 ENCOUNTER — Encounter: Payer: Self-pay | Admitting: *Deleted

## 2019-12-08 ENCOUNTER — Other Ambulatory Visit: Payer: Medicare HMO

## 2019-12-27 ENCOUNTER — Encounter: Payer: Self-pay | Admitting: Family Medicine

## 2019-12-27 NOTE — Telephone Encounter (Signed)
Message sent to patient to inquire if any other symptoms present.   Please advise.

## 2019-12-28 ENCOUNTER — Other Ambulatory Visit: Payer: Self-pay

## 2020-01-09 ENCOUNTER — Ambulatory Visit
Admission: RE | Admit: 2020-01-09 | Discharge: 2020-01-09 | Disposition: A | Discharge status: Home / Self Care | Payer: Medicare HMO | Source: Ambulatory Visit | Attending: Family Medicine | Admitting: Family Medicine

## 2020-01-09 ENCOUNTER — Other Ambulatory Visit: Payer: Self-pay

## 2020-01-09 DIAGNOSIS — M5030 Other cervical disc degeneration, unspecified cervical region: Secondary | ICD-10-CM

## 2020-01-09 DIAGNOSIS — G542 Cervical root disorders, not elsewhere classified: Secondary | ICD-10-CM

## 2020-01-11 ENCOUNTER — Other Ambulatory Visit: Payer: Self-pay | Admitting: *Deleted

## 2020-01-11 DIAGNOSIS — M4802 Spinal stenosis, cervical region: Secondary | ICD-10-CM

## 2020-01-11 DIAGNOSIS — M5030 Other cervical disc degeneration, unspecified cervical region: Secondary | ICD-10-CM

## 2020-01-11 DIAGNOSIS — M539 Dorsopathy, unspecified: Secondary | ICD-10-CM

## 2020-01-11 DIAGNOSIS — G542 Cervical root disorders, not elsewhere classified: Secondary | ICD-10-CM

## 2020-01-12 ENCOUNTER — Encounter: Payer: Self-pay | Admitting: Family Medicine

## 2020-01-14 ENCOUNTER — Ambulatory Visit
Admission: RE | Admit: 2020-01-14 | Discharge: 2020-01-14 | Disposition: A | Discharge status: Home / Self Care | Payer: Medicare HMO | Source: Ambulatory Visit | Attending: Family Medicine | Admitting: Family Medicine

## 2020-01-14 ENCOUNTER — Other Ambulatory Visit: Payer: Self-pay

## 2020-01-14 DIAGNOSIS — R1031 Right lower quadrant pain: Secondary | ICD-10-CM

## 2020-01-14 DIAGNOSIS — Z96641 Presence of right artificial hip joint: Secondary | ICD-10-CM

## 2020-01-14 MED ORDER — TAMSULOSIN HCL 0.4 MG PO CAPS: 0.8000 mg | ORAL_CAPSULE | Freq: Every day | ORAL | 3 refills | Status: DC

## 2020-02-01 ENCOUNTER — Ambulatory Visit: Payer: Medicare HMO | Admitting: Family Medicine

## 2020-02-06 ENCOUNTER — Ambulatory Visit: Payer: Medicare HMO | Admitting: Family Medicine

## 2020-02-13 ENCOUNTER — Ambulatory Visit (INDEPENDENT_AMBULATORY_CARE_PROVIDER_SITE_OTHER): Payer: Medicare HMO | Admitting: Family Medicine

## 2020-02-13 ENCOUNTER — Other Ambulatory Visit: Payer: Self-pay

## 2020-02-13 ENCOUNTER — Encounter: Payer: Self-pay | Admitting: Family Medicine

## 2020-02-13 VITALS — BP 130/68 | HR 58 | Temp 97.7°F | Resp 14 | Ht 68.0 in | Wt 188.0 lb

## 2020-02-13 DIAGNOSIS — I10 Essential (primary) hypertension: Secondary | ICD-10-CM

## 2020-02-13 DIAGNOSIS — M5030 Other cervical disc degeneration, unspecified cervical region: Secondary | ICD-10-CM

## 2020-02-13 DIAGNOSIS — E782 Mixed hyperlipidemia: Secondary | ICD-10-CM | POA: Diagnosis not present

## 2020-02-13 NOTE — Patient Instructions (Signed)
F/U 6 months for Physical  

## 2020-02-13 NOTE — Progress Notes (Signed)
Subjective:    Patient ID: Brett [name], male    DOB: 1945/10/26, 74 y.o.   MRN: [id number]  Patient presents for Follow-up (is fasting)  Patient here to follow-up chronic medical problems.  Medications reviewed.  Patient he is taking his medications as prescribed and not had any side effects no chest pain or shortness of breath.  Hyperlipidemia he is on TriCor and Colestid  Iron deficiency anemia iron studies performed by gastroenterology 3 months ago which were at goal. Off iron tablets   At her last visit he was referred to neurosurgery secondary to cervical spinal stenosis and multilevel degenerative disc disease Scheduled for nerve test      He gets his care at the [facility] as well.   Review Of Systems:  GEN- denies fatigue, fever, weight loss,weakness, recent illness HEENT- denies eye drainage, change in vision, nasal discharge, CVS- denies chest pain, palpitations RESP- denies SOB, cough, wheeze ABD- denies N/V, change in stools, abd pain GU- denies dysuria, hematuria, dribbling, incontinence MSK- denies joint pain, muscle aches, injury Neuro- denies headache, dizziness, syncope, seizure activity       Objective:    BP 130/68   Pulse (!) 58   Temp 97.7 F (36.5 C) (Temporal)   Resp 14   Ht 5\' 8"  (1.727 m)   Wt 188 lb (85.3 kg)   SpO2 97%   BMI 28.59 kg/m  GEN- NAD, alert and oriented x3 HEENT- PERRL, EOMI, non injected sclera, pink conjunctiva, MMM, oropharynx clear Neck- Supple, no thyromegaly CVS- RRR, no murmur RESP-CTAB ABD-NABS,soft,NT,ND EXT- No edema Pulses- Radial, DP- 2+        Assessment & Plan:      Problem List Items Addressed This Visit      Unprioritized   DDD (degenerative disc disease), cervical    Followed by neurosurgery awaiting nerve conduction study       Essential hypertension - Primary    Controlled no changes to meds Fasting labs today  Has F/U at [facility]       Relevant Orders   CBC with  Differential/Platelet (Completed)   Comprehensive metabolic panel (Completed)   Mixed hyperlipidemia    On tricor and colestipol Check LFT Monitor diet lower fat      Relevant Orders   Lipid panel (Completed)      Note: This dictation was prepared with Dragon dictation along with smaller phrase technology. Any transcriptional errors that result from this process are unintentional.

## 2020-02-14 LAB — COMPREHENSIVE METABOLIC PANEL
AG Ratio: 1.9 (calc) (ref 1.0–2.5)
ALT: 11 U/L (ref 9–46)
AST: 10 U/L (ref 10–35)
Albumin: 4.1 g/dL (ref 3.6–5.1)
Alkaline phosphatase (APISO): 41 U/L (ref 35–144)
BUN: 13 mg/dL (ref 7–25)
CO2: 29 mmol/L (ref 20–32)
Calcium: 9.2 mg/dL (ref 8.6–10.3)
Chloride: 103 mmol/L (ref 98–110)
Creat: 1.02 mg/dL (ref 0.70–1.18)
Globulin: 2.2 g/dL (calc) (ref 1.9–3.7)
Glucose, Bld: 98 mg/dL (ref 65–99)
Potassium: 4.6 mmol/L (ref 3.5–5.3)
Sodium: 141 mmol/L (ref 135–146)
Total Bilirubin: 0.4 mg/dL (ref 0.2–1.2)
Total Protein: 6.3 g/dL (ref 6.1–8.1)

## 2020-02-14 LAB — LIPID PANEL
Cholesterol: 197 mg/dL (ref <200)
HDL: 40 mg/dL (ref >=40)
LDL Cholesterol (Calc): 134 mg/dL (calc) — ABNORMAL HIGH
Non-HDL Cholesterol (Calc): 157 mg/dL (calc) — ABNORMAL HIGH (ref <130)
Total CHOL/HDL Ratio: 4.9 (calc) (ref <5.0)
Triglycerides: 115 mg/dL (ref <150)

## 2020-02-14 LAB — CBC WITH DIFFERENTIAL/PLATELET
Absolute Monocytes: 518 cells/uL (ref 200–950)
Basophils Absolute: 58 cells/uL (ref 0–200)
Basophils Relative: 0.8 %
Eosinophils Absolute: 346 cells/uL (ref 15–500)
Eosinophils Relative: 4.8 %
HCT: 41.9 % (ref 38.5–50.0)
Hemoglobin: 13.6 g/dL (ref 13.2–17.1)
Lymphs Abs: 1793 cells/uL (ref 850–3900)
MCH: 27.5 pg (ref 27.0–33.0)
MCHC: 32.5 g/dL (ref 32.0–36.0)
MCV: 84.8 fL (ref 80.0–100.0)
MPV: 9.4 fL (ref 7.5–12.5)
Monocytes Relative: 7.2 %
Neutro Abs: 4486 cells/uL (ref 1500–7800)
Neutrophils Relative %: 62.3 %
Platelets: 282 10*3/uL (ref 140–400)
RBC: 4.94 10*6/uL (ref 4.20–5.80)
RDW: 12.9 % (ref 11.0–15.0)
Total Lymphocyte: 24.9 %
WBC: 7.2 10*3/uL (ref 3.8–10.8)

## 2020-02-14 NOTE — Assessment & Plan Note (Signed)
Controlled no changes to meds Fasting labs today  Has F/U at [facility]

## 2020-02-14 NOTE — Assessment & Plan Note (Signed)
Followed by neurosurgery awaiting nerve conduction study

## 2020-02-14 NOTE — Assessment & Plan Note (Signed)
On tricor and colestipol Check LFT Monitor diet lower fat

## 2020-02-20 ENCOUNTER — Encounter: Payer: Self-pay | Admitting: *Deleted

## 2020-02-21 ENCOUNTER — Telehealth: Payer: Self-pay

## 2020-02-21 NOTE — Telephone Encounter (Signed)
-----   Message from Yetta Flock, MD sent at 02/21/2020  4:44 PM EDT ----- Regarding: RE: cbc due in 02-2020 Looks good thanks. At this point he can have his PCP follow up his blood counts and repeat in 6-9 months. Thanks ----- Message ----- From: Roetta Sessions, CMA Sent: 02/21/2020   8:40 AM EDT To: Yetta Flock, MD Subject: FW: cbc due in 02-2020                         FYI: Patient had CBC 02-13-20   ----- Message ----- From: Roetta Sessions, CMA Sent: 02/21/2020 To: Roetta Sessions, CMA Subject: cbc due in 02-2020                             Cbc due in October for IDA

## 2020-02-21 NOTE — Telephone Encounter (Signed)
Called pt.  Relayed results and recommendations.  Patient expressed understanding.   

## 2020-02-27 ENCOUNTER — Encounter: Payer: Self-pay | Admitting: Dermatology

## 2020-02-27 ENCOUNTER — Ambulatory Visit (INDEPENDENT_AMBULATORY_CARE_PROVIDER_SITE_OTHER): Payer: Medicare HMO | Admitting: Dermatology

## 2020-02-27 ENCOUNTER — Ambulatory Visit: Payer: Medicare HMO | Admitting: Physician Assistant

## 2020-02-27 ENCOUNTER — Other Ambulatory Visit: Payer: Self-pay

## 2020-02-27 DIAGNOSIS — L821 Other seborrheic keratosis: Secondary | ICD-10-CM | POA: Diagnosis not present

## 2020-02-27 DIAGNOSIS — Z85828 Personal history of other malignant neoplasm of skin: Secondary | ICD-10-CM | POA: Diagnosis not present

## 2020-02-27 NOTE — Progress Notes (Signed)
Left temple - SK

## 2020-03-10 DIAGNOSIS — R202 Paresthesia of skin: Secondary | ICD-10-CM | POA: Insufficient documentation

## 2020-03-18 ENCOUNTER — Other Ambulatory Visit (HOSPITAL_COMMUNITY): Payer: Self-pay | Admitting: Neurosurgery

## 2020-03-18 ENCOUNTER — Other Ambulatory Visit: Payer: Self-pay | Admitting: Neurosurgery

## 2020-03-18 ENCOUNTER — Other Ambulatory Visit: Payer: Self-pay | Admitting: Pediatrics

## 2020-03-18 DIAGNOSIS — M4802 Spinal stenosis, cervical region: Secondary | ICD-10-CM

## 2020-03-18 DIAGNOSIS — R202 Paresthesia of skin: Secondary | ICD-10-CM

## 2020-03-21 ENCOUNTER — Ambulatory Visit (HOSPITAL_COMMUNITY): Payer: Medicare HMO

## 2020-03-21 ENCOUNTER — Ambulatory Visit (HOSPITAL_COMMUNITY): Admission: RE | Admit: 2020-03-21 | Payer: Medicare HMO | Source: Ambulatory Visit

## 2020-03-21 ENCOUNTER — Encounter (HOSPITAL_COMMUNITY): Payer: Self-pay

## 2020-03-29 ENCOUNTER — Ambulatory Visit: Payer: Medicare HMO | Attending: Internal Medicine

## 2020-03-29 DIAGNOSIS — Z23 Encounter for immunization: Secondary | ICD-10-CM

## 2020-03-29 NOTE — Progress Notes (Signed)
Covid-19 Vaccination Clinic  Name:  Brett [name]    MRN: [id number] DOB: 13-Feb-1946  03/29/2020  Brett [name] was observed post Covid-19 immunization for 15 minutes without incident. He was provided with Vaccine Information Sheet and instruction to access the V-Safe system.   Brett [name] was instructed to call 911 with any severe reactions post vaccine: Marland Kitchen Difficulty breathing  . Swelling of face and throat  . A fast heartbeat  . A bad rash all over body  . Dizziness and weakness   Immunizations Administered    Name Date Dose VIS Date Route   Pfizer COVID-19 Vaccine 03/29/2020 11:27 AM 0.3 mL 03/05/2020 Intramuscular   Manufacturer: [employer], [employer]   Lot: [id number]   NDC: [address]-8

## 2020-03-31 ENCOUNTER — Other Ambulatory Visit: Payer: Self-pay | Admitting: Family Medicine

## 2020-04-27 ENCOUNTER — Encounter: Payer: Self-pay | Admitting: Family Medicine

## 2020-04-28 MED ORDER — PANTOPRAZOLE SODIUM 40 MG PO TBEC
40.0000 mg | DELAYED_RELEASE_TABLET | Freq: Two times a day (BID) | ORAL | 3 refills | Status: DC
Start: 2020-04-28 — End: 2021-03-03

## 2020-05-20 ENCOUNTER — Encounter: Payer: Self-pay | Admitting: Family Medicine

## 2020-05-27 ENCOUNTER — Other Ambulatory Visit: Payer: Self-pay | Admitting: Neurosurgery

## 2020-06-06 ENCOUNTER — Ambulatory Visit (HOSPITAL_COMMUNITY)
Admission: RE | Admit: 2020-06-06 | Discharge: 2020-06-06 | Disposition: A | Discharge status: Home / Self Care | Payer: Medicare HMO | Source: Ambulatory Visit | Attending: Neurosurgery | Admitting: Neurosurgery

## 2020-06-06 ENCOUNTER — Other Ambulatory Visit: Payer: Self-pay

## 2020-06-06 DIAGNOSIS — R202 Paresthesia of skin: Secondary | ICD-10-CM

## 2020-06-06 DIAGNOSIS — M4802 Spinal stenosis, cervical region: Secondary | ICD-10-CM

## 2020-06-06 DIAGNOSIS — M48061 Spinal stenosis, lumbar region without neurogenic claudication: Secondary | ICD-10-CM | POA: Diagnosis not present

## 2020-06-06 DIAGNOSIS — M5136 Other intervertebral disc degeneration, lumbar region: Secondary | ICD-10-CM | POA: Insufficient documentation

## 2020-06-06 DIAGNOSIS — M5030 Other cervical disc degeneration, unspecified cervical region: Secondary | ICD-10-CM | POA: Diagnosis not present

## 2020-06-06 MED ORDER — DIAZEPAM 5 MG PO TABS
ORAL_TABLET | ORAL | Status: AC
  Administered 2020-06-06: 10 mg via ORAL
  Filled 2020-06-06: qty 2

## 2020-06-06 MED ORDER — IOHEXOL 300 MG/ML  SOLN
10.0000 mL | Freq: Once | INTRAMUSCULAR | Status: AC | PRN
  Administered 2020-06-06: 10 mL via INTRATHECAL

## 2020-06-06 MED ORDER — DIAZEPAM 5 MG PO TABS
10.0000 mg | ORAL_TABLET | Freq: Once | ORAL | Status: DC
  Filled 2020-06-06: qty 2

## 2020-06-06 MED ORDER — LIDOCAINE HCL (PF) 1 % IJ SOLN
5.0000 mL | Freq: Once | INTRAMUSCULAR | Status: AC
  Administered 2020-06-06: 5 mL via INTRADERMAL

## 2020-06-06 MED ORDER — ONDANSETRON HCL 4 MG/2ML IJ SOLN: 4.0000 mg | Freq: Four times a day (QID) | INTRAMUSCULAR | Status: DC | PRN

## 2020-06-06 MED ORDER — DIAZEPAM 5 MG PO TABS
10.0000 mg | ORAL_TABLET | Freq: Once | ORAL | Status: AC
  Filled 2020-06-06: qty 2

## 2020-06-06 NOTE — Discharge Instructions (Signed)
Myelogram  A myelogram is an imaging test. This test checks for problems in the spinal cord and the places where nerves attach to the spinal cord (nerve roots). A dye (contrast material) is put into your spine before the X-ray. This provides a clearer image for your doctor to see. You may need this test if you have a spinal cord problem that cannot be diagnosed with other imaging tests. You may also have this test to check your spine after surgery. Tell a doctor about:  Any allergies you have, especially to iodine.  All medicines you are taking, including vitamins, herbs, eye drops, creams, and over-the-counter medicines.  Any problems you or family members have had with anesthetic medicines or dye.  Any blood disorders you have.  Any surgeries you have had.  Any medical conditions you have or have had, including asthma.  Whether you are pregnant or may be pregnant. What are the risks? Generally, this is a safe procedure. However, problems may occur, including:  Infection.  Bleeding.  Allergic reaction to medicines or dyes.  Damage to your spinal cord or nerves.  Leaking of spinal fluid. This can cause a headache.  Damage to kidneys.  Seizures. This is rare. What happens before the procedure?  Follow instructions from your doctor about what you cannot eat or drink. You may be asked to drink more fluids.  Ask your doctor about changing or stopping your normal medicines. This is important if you take diabetes medicines or blood thinners.  Plan to have someone take you home from the hospital or clinic.  If you will be going home right after the procedure, plan to have someone with you for 24 hours. What happens during the procedure?  You will lie face down on a table.  Your doctor will find the best injection site on your spine. This is most often in the lower back.  This area will be washed with soap.  You will be given a medicine to numb the area (local  anesthetic).  Your doctor will place a long needle into the space around your spinal cord.  A sample of spinal fluid may be taken. This may be sent to the lab for testing.  The dye will be injected into the space around your spinal cord.  The exam table may be tilted. This helps the dye flow up or down your spine.  The X-ray will take images of your spinal cord.  A bandage (dressing) may be placed over the area where the dye was injected. The procedure may vary among doctors and hospitals. What can I expect after the procedure?  You may be monitored until you leave the hospital or clinic. This includes checking your blood pressure, heart rate, breathing rate, and blood oxygen level.  You may feel sore at the injection site. You may have a mild headache.  You will be told to lie flat with your head raised (elevated). This lowers the risk of a headache.  It is up to you to get the results of your procedure. Ask your doctor, or the department that is doing the procedure, when your results will be ready. Follow these instructions at home:  Rest as told by your doctor. Lie flat with your head slightly elevated.  Do not bend, lift, or do hard work for 24-48 hours, or as told by your doctor.  Take over-the-counter and prescription medicines only as told by your doctor.  Take care of your bandage as told by your doctor.    Drink enough fluid to keep your pee (urine) pale yellow.  Bathe or shower as told by your doctor.   Contact a doctor if:  You have a fever.  You have a headache that lasts longer than 24 hours.  You feel sick to your stomach (nauseous).  You vomit.  Your neck is stiff.  Your legs feel numb.  You cannot pee.  You cannot poop (no bowel movement).  You have a rash.  You are itchy or sneezing. Get help right away if:  You have new symptoms or your symptoms get worse.  You have a seizure.  You have trouble breathing. Summary  A myelogram is an  imaging test that checks for problems in the spinal cord and the places where nerves attach to the spinal cord (nerve roots).  Before the procedure, follow instructions from your doctor. You will be told what not to eat or drink, or what medicines to change or stop.  After the procedure, you will be told to lie flat with your head raised (elevated). This will lower your risk of a headache.  Do not bend, lift, or do any hard work for 24-48 hours, or as told by your doctor.  Contact a doctor if you have a stiff neck or numb legs. Get help right away if your symptoms get worse, or you have a seizure or trouble breathing. This information is not intended to replace advice given to you by your health care provider. Make sure you discuss any questions you have with your health care provider. Document Revised: 07/12/2018 Document Reviewed: 07/13/2018 Elsevier Patient Education  2021 Elsevier Inc.  

## 2020-06-06 NOTE — Op Note (Signed)
06/06/2020 Cervical and Lumbar Myelogram  PATIENT:  Brett [name] is a 75 y.o. male with upper and lower extremity pain. He is here for a diagnostic myelogram of the cervical and lumbar spinal regions  PRE-OPERATIVE DIAGNOSIS:  Cervical, lumbar radiculopathies  POST-OPERATIVE DIAGNOSIS:  same  PROCEDURE:  Lumbar Myelogram  SURGEON:  Alexzandrea Normington  ANESTHESIA:   local LOCAL MEDICATIONS USED:  LIDOCAINE  Procedure Note: Brett [name] is a 75 y.o. male Was taken to the fluoroscopy suite and  positioned prone on the fluoroscopy table. His back was prepared and draped in a sterile manner. I infiltrated 5 cc into the lumbar region. I then introduced a spinal needle into the thecal sac at the L3/4 interlaminar space. I infiltrated 10cc of Isovue 300 into the thecal sac. Fluoroscopy showed the needle and contrast in the thecal sac. Brett [name] tolerated the procedure well. he Will be taken to CT for evaluation.     PATIENT DISPOSITION:  Short Stay

## 2020-06-07 ENCOUNTER — Other Ambulatory Visit: Payer: Self-pay | Admitting: Family Medicine

## 2020-06-24 DIAGNOSIS — M5416 Radiculopathy, lumbar region: Secondary | ICD-10-CM | POA: Insufficient documentation

## 2020-07-08 ENCOUNTER — Other Ambulatory Visit: Payer: Self-pay | Admitting: Family Medicine

## 2020-07-29 ENCOUNTER — Other Ambulatory Visit: Payer: Self-pay

## 2020-07-29 ENCOUNTER — Encounter: Payer: Self-pay | Admitting: Nurse Practitioner

## 2020-07-29 ENCOUNTER — Ambulatory Visit (INDEPENDENT_AMBULATORY_CARE_PROVIDER_SITE_OTHER): Payer: Medicare HMO | Admitting: Nurse Practitioner

## 2020-07-29 VITALS — BP 134/60 | HR 57 | Temp 97.0°F | Ht 68.0 in | Wt 186.0 lb

## 2020-07-29 DIAGNOSIS — E782 Mixed hyperlipidemia: Secondary | ICD-10-CM

## 2020-07-29 DIAGNOSIS — Z125 Encounter for screening for malignant neoplasm of prostate: Secondary | ICD-10-CM

## 2020-07-29 DIAGNOSIS — I10 Essential (primary) hypertension: Secondary | ICD-10-CM

## 2020-07-29 DIAGNOSIS — F5102 Adjustment insomnia: Secondary | ICD-10-CM

## 2020-07-29 DIAGNOSIS — N400 Enlarged prostate without lower urinary tract symptoms: Secondary | ICD-10-CM | POA: Insufficient documentation

## 2020-07-29 DIAGNOSIS — Z1159 Encounter for screening for other viral diseases: Secondary | ICD-10-CM

## 2020-07-29 DIAGNOSIS — K219 Gastro-esophageal reflux disease without esophagitis: Secondary | ICD-10-CM | POA: Diagnosis not present

## 2020-07-29 DIAGNOSIS — G4733 Obstructive sleep apnea (adult) (pediatric): Secondary | ICD-10-CM

## 2020-07-29 DIAGNOSIS — Z0001 Encounter for general adult medical examination with abnormal findings: Secondary | ICD-10-CM

## 2020-07-29 DIAGNOSIS — Z9989 Dependence on other enabling machines and devices: Secondary | ICD-10-CM

## 2020-07-29 DIAGNOSIS — Z0000 Encounter for general adult medical examination without abnormal findings: Secondary | ICD-10-CM

## 2020-07-29 DIAGNOSIS — N401 Enlarged prostate with lower urinary tract symptoms: Secondary | ICD-10-CM

## 2020-07-29 DIAGNOSIS — R3914 Feeling of incomplete bladder emptying: Secondary | ICD-10-CM

## 2020-07-29 MED ORDER — DUTASTERIDE 0.5 MG PO CAPS: 0.5000 mg | ORAL_CAPSULE | Freq: Every day | ORAL | 1 refills | Status: DC

## 2020-07-29 MED ORDER — ZOLPIDEM TARTRATE 10 MG PO TABS: 10.0000 mg | ORAL_TABLET | Freq: Every evening | ORAL | 1 refills | Status: DC | PRN

## 2020-07-29 NOTE — Patient Instructions (Addendum)
F/u in 6 months.  Nice meeting you today.  We will message you with your lab results tomorrow.    Let me know how the dutasteride is going.    Take care! Brett [name]    Benign Prostatic Hyperplasia  Benign prostatic hyperplasia (BPH) is an enlarged prostate gland that is caused by the normal aging process and not by cancer. The prostate is a walnut-sized gland that is involved in the production of semen. It is located in front of the rectum and below the bladder. The bladder stores urine and the urethra is the tube that carries the urine out of the body. The prostate may get bigger as a man gets older. An enlarged prostate can press on the urethra. This can make it harder to pass urine. The build-up of urine in the bladder can cause infection. Back pressure and infection may progress to bladder damage and kidney (renal) failure. What are the causes? This condition is part of a normal aging process. However, not all men develop problems from this condition. If the prostate enlarges away from the urethra, urine flow will not be blocked. If it enlarges toward the urethra and compresses it, there will be problems passing urine. What increases the risk? This condition is more likely to develop in men over the age of 42 years. What are the signs or symptoms? Symptoms of this condition include:  Getting up often during the night to urinate.  Needing to urinate frequently during the day.  Difficulty starting urine flow.  Decrease in size and strength of your urine stream.  Leaking (dribbling) after urinating.  Inability to pass urine. This needs immediate treatment.  Inability to completely empty your bladder.  Pain when you pass urine. This is more common if there is also an infection.  Urinary tract infection (UTI). How is this diagnosed? This condition is diagnosed based on your medical history, a physical exam, and your symptoms. Tests will also be done, such as:  A post-void  bladder scan. This measures any amount of urine that may remain in your bladder after you finish urinating.  A digital rectal exam. In a rectal exam, your health care provider checks your prostate by putting a lubricated, gloved finger into your rectum to feel the back of your prostate gland. This exam detects the size of your gland and any abnormal lumps or growths.  An exam of your urine (urinalysis).  A prostate specific antigen (PSA) screening. This is a blood test used to screen for prostate cancer.  An ultrasound. This test uses sound waves to electronically produce a picture of your prostate gland. Your health care provider may refer you to a specialist in kidney and prostate diseases (urologist). How is this treated? Once symptoms begin, your health care provider will monitor your condition (active surveillance or watchful waiting). Treatment for this condition will depend on the severity of your condition. Treatment may include:  Observation and yearly exams. This may be the only treatment needed if your condition and symptoms are mild.  Medicines to relieve your symptoms, including: ? Medicines to shrink the prostate. ? Medicines to relax the muscle of the prostate.  Surgery in severe cases. Surgery may include: ? Prostatectomy. In this procedure, the prostate tissue is removed completely through an open incision or with a laparoscope or robotics. ? Transurethral resection of the prostate (TURP). In this procedure, a tool is inserted through the opening at the tip of the penis (urethra). It is used to cut away  tissue of the inner core of the prostate. The pieces are removed through the same opening of the penis. This removes the blockage. ? Transurethral incision (TUIP). In this procedure, small cuts are made in the prostate. This lessens the prostate's pressure on the urethra. ? Transurethral microwave thermotherapy (TUMT). This procedure uses microwaves to create heat. The heat  destroys and removes a small amount of prostate tissue. ? Transurethral needle ablation (TUNA). This procedure uses radio frequencies to destroy and remove a small amount of prostate tissue. ? Interstitial laser coagulation ([place]). This procedure uses a laser to destroy and remove a small amount of prostate tissue. ? Transurethral electrovaporization (TUVP). This procedure uses electrodes to destroy and remove a small amount of prostate tissue. ? Prostatic urethral lift. This procedure inserts an implant to push the lobes of the prostate away from the urethra. Follow these instructions at home:  Take over-the-counter and prescription medicines only as told by your health care provider.  Monitor your symptoms for any changes. Contact your health care provider with any changes.  Avoid drinking large amounts of liquid before going to bed or out in public.  Avoid or reduce how much caffeine or alcohol you drink.  Give yourself time when you urinate.  Keep all follow-up visits as told by your health care provider. This is important. Contact a health care provider if:  You have unexplained back pain.  Your symptoms do not get better with treatment.  You develop side effects from the medicine you are taking.  Your urine becomes very dark or has a bad smell.  Your lower abdomen becomes distended and you have trouble passing your urine. Get help right away if:  You have a fever or chills.  You suddenly cannot urinate.  You feel lightheaded, or very dizzy, or you faint.  There are large amounts of blood or clots in the urine.  Your urinary problems become hard to manage.  You develop moderate to severe low back or flank pain. The flank is the side of your body between the ribs and the hip. These symptoms may represent a serious problem that is an emergency. Do not wait to see if the symptoms will go away. Get medical help right away. Call your local emergency services (911 in the  [place] Do not drive yourself to the hospital. Summary  Benign prostatic hyperplasia (BPH) is an enlarged prostate that is caused by the normal aging process and not by cancer.  An enlarged prostate can press on the urethra. This can make it hard to pass urine.  This condition is part of a normal aging process and is more likely to develop in men over the age of 11 years.  Get help right away if you suddenly cannot urinate. This information is not intended to replace advice given to you by your health care provider. Make sure you discuss any questions you have with your health care provider. Document Revised: 01/10/2020 Document Reviewed: 01/10/2020 Elsevier Patient Education  [employer].

## 2020-07-29 NOTE — Progress Notes (Signed)
He is alert and oriented to person, place, and time.     Motor: No weakness.     Gait: Gait normal.  Psychiatric:        Mood and Affect: Mood normal.        Behavior: Behavior normal.        Thought Content: Thought content normal.        Judgment: Judgment normal.    Cognitive Testing - 6-CIT  Correct? Score   What year is it? yes 0 Yes = 0    No = 4  What month is it? yes 0 Yes = 0    No = 3  Remember:     Pia Mau, [place], [place]     What time is it? yes 0 Yes = 0    No = 3  Count backwards from 20 to 1 yes 0 Correct = 0    1 error = 2   More than 1 error = 4  Say the months of the year in reverse. yes 0 Correct = 0    1 error = 2   More than 1 error = 4  What address did I ask you to remember? yes 0 Correct = 0  1 error = 2    2 error = 4    3 error = 6    4 error = 8    All wrong =  10       TOTAL SCORE  0/28   Interpretation:  Normal  Normal (0-7) Abnormal (8-28)    Assessment & Plan:    Given increase in urinary symptoms associated with BPH, will add in Avodart to help with incomplete emptying.  PSA also checked today.  If symptoms do not improve, consider referral to Urology.  Regarding insomnia - Refill given for very seldom use of Ambien - less than once per month. PDMP reviewed and appropriate.  Pt is aware of risks of psychoactive medication use to include increased sedation, respiratory suppression, falls, extrapyramidal movements,  dependence and cardiovascular events.  Pt would like to continue treatment as benefit determined to outweigh risk.      Annual Wellness Visit  Reviewed patient's Family Medical History Reviewed and updated list of patient's medical providers Assessment of cognitive impairment was done Assessed patient's functional ability Established a written schedule for health screening [place] Completed and Reviewed  Exercise Activities and Dietary recommendations Goals   None     Immunization History  Administered Date(s) Administered  . Fluad Quad(high Dose 65+) 02/22/2019  . Influenza, High Dose Seasonal PF 05/04/2017, 02/21/2018  . Influenza,inj,Quad PF,6+ Mos 06/18/2016  . Influenza-Unspecified 04/16/2017  . PFIZER(Purple Top)SARS-COV-2 Vaccination 06/25/2019, 07/20/2019, 03/29/2020  . Pneumococcal Conjugate-13 10/09/2014  . Zoster 05/17/2010    Health Maintenance  Topic Date Due  . Hepatitis C Screening  05/17/2028 (Originally 30-Sep-1945)  . TETANUS/TDAP  12/16/2022  . COLONOSCOPY (Pts 45-53yrs Insurance coverage will need to be confirmed)  08/08/2029  . INFLUENZA VACCINE  Completed  . COVID-19 Vaccine  Completed  . PNA vac Low Risk Adult  Completed  . HPV VACCINES  Aged Out    Discussed health benefits of physical activity, and encouraged him to engage in regular exercise appropriate for his  age and condition.   Meds ordered this encounter  Medications  . dutasteride (AVODART) 0.5 MG capsule    Sig: Take 1 capsule (0.5 mg total) by mouth daily.    Dispense:  He is alert and oriented to person, place, and time.     Motor: No weakness.     Gait: Gait normal.  Psychiatric:        Mood and Affect: Mood normal.        Behavior: Behavior normal.        Thought Content: Thought content normal.        Judgment: Judgment normal.    Cognitive Testing - 6-CIT  Correct? Score   What year is it? yes 0 Yes = 0    No = 4  What month is it? yes 0 Yes = 0    No = 3  Remember:     Pia Mau, [place], [place]     What time is it? yes 0 Yes = 0    No = 3  Count backwards from 20 to 1 yes 0 Correct = 0    1 error = 2   More than 1 error = 4  Say the months of the year in reverse. yes 0 Correct = 0    1 error = 2   More than 1 error = 4  What address did I ask you to remember? yes 0 Correct = 0  1 error = 2    2 error = 4    3 error = 6    4 error = 8    All wrong =  10       TOTAL SCORE  0/28   Interpretation:  Normal  Normal (0-7) Abnormal (8-28)    Assessment & Plan:    Given increase in urinary symptoms associated with BPH, will add in Avodart to help with incomplete emptying.  PSA also checked today.  If symptoms do not improve, consider referral to Urology.  Regarding insomnia - Refill given for very seldom use of Ambien - less than once per month. PDMP reviewed and appropriate.  Pt is aware of risks of psychoactive medication use to include increased sedation, respiratory suppression, falls, extrapyramidal movements,  dependence and cardiovascular events.  Pt would like to continue treatment as benefit determined to outweigh risk.      Annual Wellness Visit  Reviewed patient's Family Medical History Reviewed and updated list of patient's medical providers Assessment of cognitive impairment was done Assessed patient's functional ability Established a written schedule for health screening [place] Completed and Reviewed  Exercise Activities and Dietary recommendations Goals   None     Immunization History  Administered Date(s) Administered  . Fluad Quad(high Dose 65+) 02/22/2019  . Influenza, High Dose Seasonal PF 05/04/2017, 02/21/2018  . Influenza,inj,Quad PF,6+ Mos 06/18/2016  . Influenza-Unspecified 04/16/2017  . PFIZER(Purple Top)SARS-COV-2 Vaccination 06/25/2019, 07/20/2019, 03/29/2020  . Pneumococcal Conjugate-13 10/09/2014  . Zoster 05/17/2010    Health Maintenance  Topic Date Due  . Hepatitis C Screening  05/17/2028 (Originally 30-Sep-1945)  . TETANUS/TDAP  12/16/2022  . COLONOSCOPY (Pts 45-53yrs Insurance coverage will need to be confirmed)  08/08/2029  . INFLUENZA VACCINE  Completed  . COVID-19 Vaccine  Completed  . PNA vac Low Risk Adult  Completed  . HPV VACCINES  Aged Out    Discussed health benefits of physical activity, and encouraged him to engage in regular exercise appropriate for his  age and condition.   Meds ordered this encounter  Medications  . dutasteride (AVODART) 0.5 MG capsule    Sig: Take 1 capsule (0.5 mg total) by mouth daily.    Dispense:  [name], Male    DOB: [birth year], 75 y.o.   MRN: [id number]  Visit Date: 07/29/2020  Today's Provider: Eulogio Bear, NP   Chief Complaint  Patient presents with  . Medicare Wellness    Subjective:   Brett [name] is a 75 y.o. male who presents today for his Subsequent Annual Wellness Visit.  HPI  Hypertension - blood pressure well controlled at home with amlodipine 10 mg and losartan 25 mg.  Denies chest pain, vision changes, dizziness/lightheadedness, shortness of breath.   Hyperlipidemia - previously intolerant to statins.  Maintained on Colestid 1g and fenofibrate 48 mg.  Neck pain - follows with Neurosurgeon.  BPH -  is not feeling like flomax is helping empty the bladder completey.  Denies burning with urination, hematuria, increased nocturnal frequency.   IPSS    Row Name 07/29/20 0855         International Prostate Symptom Score   How often have you had the sensation of not emptying your bladder? Less than half the time     How often have you had to urinate less than every two hours? Not at All     How often have you found you stopped and started again several times when you urinated? Less than 1 in 5 times     How often have you found it difficult to postpone urination? Not at All     How often have you had a weak urinary stream? Not at All     How often have you had to strain to start urination? Not at All     How many times did you typically get up at night to urinate? 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   HENT: Negative.   Eyes: Negative.   Respiratory: Negative.   Cardiovascular: Negative.   Gastrointestinal: Negative.   Genitourinary: Positive for difficulty urinating and frequency. Negative for decreased urine volume, dysuria, enuresis, flank pain, genital sores, hematuria, penile discharge, penile pain, penile swelling, scrotal swelling,  testicular pain and urgency.  Musculoskeletal: Negative.   Neurological: Negative.   Psychiatric/Behavioral: Negative.     Past Medical History:  Diagnosis Date  . Allergy    seasonal  . Anemia 06/2019   iron deficiency  . Arthritis   . Cataract   . Diverticulosis   . GERD (gastroesophageal reflux disease)   . Hiatal hernia 2014   Dr. Lyda Perone [facility])  . History of hiatal hernia   . Hyperlipidemia   . Hypertension   . IBS (irritable colon syndrome)   . Pneumonia    hx. of  . PONV (postoperative nausea and vomiting)    with gallbladder surgery  . SCC (squamous cell carcinoma) 01/02/2019   R temple-txpbx  . Sleep apnea    cpap  . Vertigo     Past Surgical History:  Procedure Laterality Date  . CARPAL TUNNEL RELEASE Bilateral   . CHOLECYSTECTOMY  2004   Post-cholecystectomy syndrome   . COLONOSCOPY    . ESOPHAGOGASTRODUODENOSCOPY    . EYE SURGERY     cataract  . FRACTURE SURGERY  2006   fx. wrist  . JOINT REPLACEMENT    . KNEE SURGERY Left 2020  . rhino plasty     deviated septum  . SHOULDER SURGERY  2011   rotator cuff  . TOTAL  He is alert and oriented to person, place, and time.     Motor: No weakness.     Gait: Gait normal.  Psychiatric:        Mood and Affect: Mood normal.        Behavior: Behavior normal.        Thought Content: Thought content normal.        Judgment: Judgment normal.    Cognitive Testing - 6-CIT  Correct? Score   What year is it? yes 0 Yes = 0    No = 4  What month is it? yes 0 Yes = 0    No = 3  Remember:     Pia Mau, [place], [place]     What time is it? yes 0 Yes = 0    No = 3  Count backwards from 20 to 1 yes 0 Correct = 0    1 error = 2   More than 1 error = 4  Say the months of the year in reverse. yes 0 Correct = 0    1 error = 2   More than 1 error = 4  What address did I ask you to remember? yes 0 Correct = 0  1 error = 2    2 error = 4    3 error = 6    4 error = 8    All wrong =  10       TOTAL SCORE  0/28   Interpretation:  Normal  Normal (0-7) Abnormal (8-28)    Assessment & Plan:    Given increase in urinary symptoms associated with BPH, will add in Avodart to help with incomplete emptying.  PSA also checked today.  If symptoms do not improve, consider referral to Urology.  Regarding insomnia - Refill given for very seldom use of Ambien - less than once per month. PDMP reviewed and appropriate.  Pt is aware of risks of psychoactive medication use to include increased sedation, respiratory suppression, falls, extrapyramidal movements,  dependence and cardiovascular events.  Pt would like to continue treatment as benefit determined to outweigh risk.      Annual Wellness Visit  Reviewed patient's Family Medical History Reviewed and updated list of patient's medical providers Assessment of cognitive impairment was done Assessed patient's functional ability Established a written schedule for health screening [place] Completed and Reviewed  Exercise Activities and Dietary recommendations Goals   None     Immunization History  Administered Date(s) Administered  . Fluad Quad(high Dose 65+) 02/22/2019  . Influenza, High Dose Seasonal PF 05/04/2017, 02/21/2018  . Influenza,inj,Quad PF,6+ Mos 06/18/2016  . Influenza-Unspecified 04/16/2017  . PFIZER(Purple Top)SARS-COV-2 Vaccination 06/25/2019, 07/20/2019, 03/29/2020  . Pneumococcal Conjugate-13 10/09/2014  . Zoster 05/17/2010    Health Maintenance  Topic Date Due  . Hepatitis C Screening  05/17/2028 (Originally 30-Sep-1945)  . TETANUS/TDAP  12/16/2022  . COLONOSCOPY (Pts 45-53yrs Insurance coverage will need to be confirmed)  08/08/2029  . INFLUENZA VACCINE  Completed  . COVID-19 Vaccine  Completed  . PNA vac Low Risk Adult  Completed  . HPV VACCINES  Aged Out    Discussed health benefits of physical activity, and encouraged him to engage in regular exercise appropriate for his  age and condition.   Meds ordered this encounter  Medications  . dutasteride (AVODART) 0.5 MG capsule    Sig: Take 1 capsule (0.5 mg total) by mouth daily.    Dispense:  [name], Male    DOB: [birth year], 75 y.o.   MRN: [id number]  Visit Date: 07/29/2020  Today's Provider: Eulogio Bear, NP   Chief Complaint  Patient presents with  . Medicare Wellness    Subjective:   Brett [name] is a 75 y.o. male who presents today for his Subsequent Annual Wellness Visit.  HPI  Hypertension - blood pressure well controlled at home with amlodipine 10 mg and losartan 25 mg.  Denies chest pain, vision changes, dizziness/lightheadedness, shortness of breath.   Hyperlipidemia - previously intolerant to statins.  Maintained on Colestid 1g and fenofibrate 48 mg.  Neck pain - follows with Neurosurgeon.  BPH -  is not feeling like flomax is helping empty the bladder completey.  Denies burning with urination, hematuria, increased nocturnal frequency.   IPSS    Row Name 07/29/20 0855         International Prostate Symptom Score   How often have you had the sensation of not emptying your bladder? Less than half the time     How often have you had to urinate less than every two hours? Not at All     How often have you found you stopped and started again several times when you urinated? Less than 1 in 5 times     How often have you found it difficult to postpone urination? Not at All     How often have you had a weak urinary stream? Not at All     How often have you had to strain to start urination? Not at All     How many times did you typically get up at night to urinate? 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   HENT: Negative.   Eyes: Negative.   Respiratory: Negative.   Cardiovascular: Negative.   Gastrointestinal: Negative.   Genitourinary: Positive for difficulty urinating and frequency. Negative for decreased urine volume, dysuria, enuresis, flank pain, genital sores, hematuria, penile discharge, penile pain, penile swelling, scrotal swelling,  testicular pain and urgency.  Musculoskeletal: Negative.   Neurological: Negative.   Psychiatric/Behavioral: Negative.     Past Medical History:  Diagnosis Date  . Allergy    seasonal  . Anemia 06/2019   iron deficiency  . Arthritis   . Cataract   . Diverticulosis   . GERD (gastroesophageal reflux disease)   . Hiatal hernia 2014   Dr. Lyda Perone [facility])  . History of hiatal hernia   . Hyperlipidemia   . Hypertension   . IBS (irritable colon syndrome)   . Pneumonia    hx. of  . PONV (postoperative nausea and vomiting)    with gallbladder surgery  . SCC (squamous cell carcinoma) 01/02/2019   R temple-txpbx  . Sleep apnea    cpap  . Vertigo     Past Surgical History:  Procedure Laterality Date  . CARPAL TUNNEL RELEASE Bilateral   . CHOLECYSTECTOMY  2004   Post-cholecystectomy syndrome   . COLONOSCOPY    . ESOPHAGOGASTRODUODENOSCOPY    . EYE SURGERY     cataract  . FRACTURE SURGERY  2006   fx. wrist  . JOINT REPLACEMENT    . KNEE SURGERY Left 2020  . rhino plasty     deviated septum  . SHOULDER SURGERY  2011   rotator cuff  . TOTAL

## 2020-07-30 LAB — COMPLETE METABOLIC PANEL WITH GFR
AG Ratio: 1.8 (calc) (ref 1.0–2.5)
ALT: 13 U/L (ref 9–46)
AST: 10 U/L (ref 10–35)
Albumin: 4.2 g/dL (ref 3.6–5.1)
Alkaline phosphatase (APISO): 39 U/L (ref 35–144)
BUN: 12 mg/dL (ref 7–25)
CO2: 27 mmol/L (ref 20–32)
Calcium: 9.2 mg/dL (ref 8.6–10.3)
Chloride: 103 mmol/L (ref 98–110)
Creat: 0.89 mg/dL (ref 0.70–1.18)
GFR, Est African American: 98 mL/min/{1.73_m2} (ref >=60)
GFR, Est Non African American: 85 mL/min/{1.73_m2} (ref >=60)
Globulin: 2.3 g/dL (calc) (ref 1.9–3.7)
Glucose, Bld: 92 mg/dL (ref 65–99)
Potassium: 4.2 mmol/L (ref 3.5–5.3)
Sodium: 140 mmol/L (ref 135–146)
Total Bilirubin: 0.5 mg/dL (ref 0.2–1.2)
Total Protein: 6.5 g/dL (ref 6.1–8.1)

## 2020-07-30 LAB — CBC WITH DIFFERENTIAL/PLATELET
Absolute Monocytes: 568 cells/uL (ref 200–950)
Basophils Absolute: 16 cells/uL (ref 0–200)
Basophils Relative: 0.2 %
Eosinophils Absolute: 32 cells/uL (ref 15–500)
Eosinophils Relative: 0.4 %
HCT: 42.7 % (ref 38.5–50.0)
Hemoglobin: 14 g/dL (ref 13.2–17.1)
Lymphs Abs: 1712 cells/uL (ref 850–3900)
MCH: 27.6 pg (ref 27.0–33.0)
MCHC: 32.8 g/dL (ref 32.0–36.0)
MCV: 84.1 fL (ref 80.0–100.0)
MPV: 8.8 fL (ref 7.5–12.5)
Monocytes Relative: 7.1 %
Neutro Abs: 5672 cells/uL (ref 1500–7800)
Neutrophils Relative %: 70.9 %
Platelets: 315 10*3/uL (ref 140–400)
RBC: 5.08 10*6/uL (ref 4.20–5.80)
RDW: 12.9 % (ref 11.0–15.0)
Total Lymphocyte: 21.4 %
WBC: 8 10*3/uL (ref 3.8–10.8)

## 2020-07-30 LAB — PSA: PSA: 0.58 ng/mL (ref <=4.0)

## 2020-07-30 LAB — LIPID PANEL
Cholesterol: 190 mg/dL (ref <200)
HDL: 48 mg/dL (ref >=40)
LDL Cholesterol (Calc): 121 mg/dL (calc) — ABNORMAL HIGH
Non-HDL Cholesterol (Calc): 142 mg/dL (calc) — ABNORMAL HIGH (ref <130)
Total CHOL/HDL Ratio: 4 (calc) (ref <5.0)
Triglycerides: 107 mg/dL (ref <150)

## 2020-07-30 LAB — HEPATITIS C ANTIBODY
Hepatitis C Ab: NONREACTIVE
SIGNAL TO CUT-OFF: 0.01 (ref <1.00)

## 2020-07-30 LAB — HEMOGLOBIN A1C
Hgb A1c MFr Bld: 6 % of total Hgb — ABNORMAL HIGH (ref <5.7)
Mean Plasma Glucose: 126 mg/dL
eAG (mmol/L): 7 mmol/L

## 2020-08-01 ENCOUNTER — Encounter: Payer: Medicare HMO | Admitting: Family Medicine

## 2020-08-01 ENCOUNTER — Encounter: Payer: Medicare HMO | Admitting: Nurse Practitioner

## 2020-08-20 ENCOUNTER — Encounter: Payer: Self-pay | Admitting: Nurse Practitioner

## 2020-08-20 MED ORDER — DICLOFENAC SODIUM 75 MG PO TBEC: 75.0000 mg | DELAYED_RELEASE_TABLET | Freq: Two times a day (BID) | ORAL | 3 refills | Status: DC

## 2020-08-21 ENCOUNTER — Other Ambulatory Visit: Payer: Self-pay | Admitting: *Deleted

## 2020-08-21 MED ORDER — DICLOFENAC SODIUM 75 MG PO TBEC: 75.0000 mg | DELAYED_RELEASE_TABLET | Freq: Two times a day (BID) | ORAL | 0 refills | Status: DC

## 2020-09-19 ENCOUNTER — Other Ambulatory Visit: Payer: Self-pay | Admitting: Family Medicine

## 2020-10-02 ENCOUNTER — Encounter: Payer: Self-pay | Admitting: Family Medicine

## 2020-10-02 ENCOUNTER — Other Ambulatory Visit: Payer: Self-pay

## 2020-10-02 ENCOUNTER — Ambulatory Visit (INDEPENDENT_AMBULATORY_CARE_PROVIDER_SITE_OTHER): Payer: Medicare HMO | Admitting: Family Medicine

## 2020-10-02 VITALS — BP 102/56 | HR 72 | Temp 98.9°F | Resp 18 | Ht 68.0 in | Wt 181.0 lb

## 2020-10-02 DIAGNOSIS — E860 Dehydration: Secondary | ICD-10-CM

## 2020-10-02 DIAGNOSIS — K529 Noninfective gastroenteritis and colitis, unspecified: Secondary | ICD-10-CM | POA: Diagnosis not present

## 2020-10-02 LAB — CBC WITH DIFFERENTIAL/PLATELET
Absolute Monocytes: 840 cells/uL (ref 200–950)
Basophils Absolute: 34 cells/uL (ref 0–200)
Basophils Relative: 0.3 %
Eosinophils Absolute: 11 cells/uL — ABNORMAL LOW (ref 15–500)
Eosinophils Relative: 0.1 %
HCT: 39.6 % (ref 38.5–50.0)
Hemoglobin: 13 g/dL — ABNORMAL LOW (ref 13.2–17.1)
Lymphs Abs: 784 cells/uL — ABNORMAL LOW (ref 850–3900)
MCH: 27.5 pg (ref 27.0–33.0)
MCHC: 32.8 g/dL (ref 32.0–36.0)
MCV: 83.9 fL (ref 80.0–100.0)
MPV: 9.5 fL (ref 7.5–12.5)
Monocytes Relative: 7.5 %
Neutro Abs: 9531 cells/uL — ABNORMAL HIGH (ref 1500–7800)
Neutrophils Relative %: 85.1 %
Platelets: 266 10*3/uL (ref 140–400)
RBC: 4.72 10*6/uL (ref 4.20–5.80)
RDW: 13.1 % (ref 11.0–15.0)
Total Lymphocyte: 7 %
WBC: 11.2 10*3/uL — ABNORMAL HIGH (ref 3.8–10.8)

## 2020-10-02 LAB — COMPLETE METABOLIC PANEL WITH GFR
AG Ratio: 1.6 (calc) (ref 1.0–2.5)
ALT: 17 U/L (ref 9–46)
AST: 18 U/L (ref 10–35)
Albumin: 3.6 g/dL (ref 3.6–5.1)
Alkaline phosphatase (APISO): 41 U/L (ref 35–144)
BUN: 15 mg/dL (ref 7–25)
CO2: 23 mmol/L (ref 20–32)
Calcium: 8.7 mg/dL (ref 8.6–10.3)
Chloride: 102 mmol/L (ref 98–110)
Creat: 0.88 mg/dL (ref 0.70–1.18)
GFR, Est African American: 99 mL/min/{1.73_m2} (ref >=60)
GFR, Est Non African American: 85 mL/min/{1.73_m2} (ref >=60)
Globulin: 2.2 g/dL (calc) (ref 1.9–3.7)
Glucose, Bld: 100 mg/dL — ABNORMAL HIGH (ref 65–99)
Potassium: 3.7 mmol/L (ref 3.5–5.3)
Sodium: 136 mmol/L (ref 135–146)
Total Bilirubin: 0.6 mg/dL (ref 0.2–1.2)
Total Protein: 5.8 g/dL — ABNORMAL LOW (ref 6.1–8.1)

## 2020-10-02 MED ORDER — CIPROFLOXACIN HCL 500 MG PO TABS: 500.0000 mg | ORAL_TABLET | Freq: Two times a day (BID) | ORAL | 0 refills | Status: AC

## 2020-10-02 MED ORDER — METRONIDAZOLE 500 MG PO TABS: 500.0000 mg | ORAL_TABLET | Freq: Two times a day (BID) | ORAL | 0 refills | Status: DC

## 2020-10-02 NOTE — Progress Notes (Signed)
Subjective:    Patient ID: Brett [name], male    DOB: 10-02-1945, 75 y.o.   MRN: [id number]  HPI Patient started feeling sick earlier this week.  His wife was diagnosed with a stomach flu on Tuesday.  Patient reports a fever to 101 at home.  He reports lower abdominal pain.  The pain is worse in the suprapubic area and left lower quadrant.  He reports frequent watery diarrhea.  He reports intestinal spasm and cramps.  He reports some nausea but no vomiting.  He denies any chest pain.  He denies any cough.  He denies any shortness of breath.  He denies any rhinorrhea or sore throat or sinus pain.  On exam today, he is tender to palpation in the suprapubic area and left lower quadrant.  Differential diagnosis includes colitis. Past Medical History:  Diagnosis Date  . Allergy    seasonal  . Anemia 06/2019   iron deficiency  . Arthritis   . Cataract   . Diverticulosis   . GERD (gastroesophageal reflux disease)   . Hiatal hernia 2014   Dr. Elisabeth [facility])  . History of hiatal hernia   . Hyperlipidemia   . Hypertension   . IBS (irritable colon syndrome)   . Pneumonia    hx. of  . PONV (postoperative nausea and vomiting)    with gallbladder surgery  . SCC (squamous cell carcinoma) 01/02/2019   R temple-txpbx  . Sleep apnea    cpap  . Vertigo    Past Surgical History:  Procedure Laterality Date  . CARPAL TUNNEL RELEASE Bilateral   . CHOLECYSTECTOMY  2004   Post-cholecystectomy syndrome   . COLONOSCOPY    . ESOPHAGOGASTRODUODENOSCOPY    . EYE SURGERY     cataract  . FRACTURE SURGERY  2006   fx. wrist  . JOINT REPLACEMENT    . KNEE SURGERY Left 2020  . rhino plasty     deviated septum  . SHOULDER SURGERY  2011   rotator cuff  . TOTAL HIP ARTHROPLASTY Right 12/13/2016   Procedure: RIGHT TOTAL HIP ARTHROPLASTY ANTERIOR APPROACH;  Surgeon: Samson Frederic, MD;  Location: [facility] OR;  Service: Orthopedics;  Laterality: Right;  . TOTAL SHOULDER ARTHROPLASTY Right 04/15/2017    Procedure: RIGHT TOTAL SHOULDER ARTHROPLASTY;  Surgeon: Beverely Low, MD;  Location[facility];  Service: Orthopedics;  Laterality: Right;  . TYMPANOSTOMY TUBE PLACEMENT Left 2020  . UPPER GASTROINTESTINAL ENDOSCOPY    . VASECTOMY  1980   Current Outpatient Medications on File Prior to Visit  Medication Sig Dispense Refill  . amLODipine (NORVASC) 10 MG tablet TAKE 1 TABLET DAILY 90 tablet 3  . colestipol (COLESTID) 1 g tablet TAKE 1 TABLET TWICE A DAY 180 tablet 3  . diclofenac (VOLTAREN) 75 MG EC tablet Take 1 tablet (75 mg total) by mouth 2 (two) times daily. 90 tablet 0  . dutasteride (AVODART) 0.5 MG capsule Take 1 capsule (0.5 mg total) by mouth daily. 90 capsule 1  . fenofibrate (TRICOR) 48 MG tablet TAKE 1 TABLET DAILY 90 tablet 3  . losartan (COZAAR) 25 MG tablet TAKE 1 TABLET DAILY (Patient taking differently: Take 25 mg by mouth daily.) 90 tablet 3  . pantoprazole (PROTONIX) 40 MG tablet Take 1 tablet (40 mg total) by mouth 2 (two) times daily. 180 tablet 3  . tamsulosin (FLOMAX) 0.4 MG CAPS capsule Take 2 capsules (0.8 mg total) by mouth daily. 180 capsule 3  . triamcinolone (NASACORT) 55 MCG/ACT AERO nasal inhaler  Place 2 sprays into the nose daily.    Marland Kitchen zolpidem (AMBIEN) 10 MG tablet Take 1 tablet (10 mg total) by mouth at bedtime as needed for sleep. 15 tablet 1   No current facility-administered medications on file prior to visit.   Allergies  Allergen Reactions  . Other   . Statins     myalgias  . Tramadol Nausea Only   Social History   Socioeconomic History  . Marital status: Married    Spouse name: Not on file  . Number of children: Not on file  . Years of education: Not on file  . Highest education level: Not on file  Occupational History  . Not on file  Tobacco Use  . Smoking status: Former Smoker    Packs/day: 2.00    Years: 51.00    Pack years: 102.00    Types: Cigarettes, Cigars    Start date: 1963    Quit date: 2012    Years since quitting: 10.3  .  Smokeless tobacco: Never Used  Vaping Use  . Vaping Use: Never used  Substance and Sexual Activity  . Alcohol use: No  . Drug use: No  . Sexual activity: Yes  Other Topics Concern  . Not on file  Social History Narrative  . Not on file   Social Determinants of Health   Financial Resource Strain: Not on file  Food Insecurity: Not on file  Transportation Needs: Not on file  Physical Activity: Not on file  Stress: Not on file  Social Connections: Not on file  Intimate Partner Violence: Not on file      Review of Systems  All other systems reviewed and are negative.      Objective:   Physical Exam Vitals reviewed.  Constitutional:      Appearance: He is normal weight. He is ill-appearing. He is not toxic-appearing.  HENT:     Right Ear: Tympanic membrane and ear canal normal.     Left Ear: Tympanic membrane and ear canal normal.  Cardiovascular:     Rate and Rhythm: Normal rate and regular rhythm.     Heart sounds: Normal heart sounds.  Pulmonary:     Effort: Pulmonary effort is normal. No respiratory distress.     Breath sounds: Normal breath sounds. No wheezing, rhonchi or rales.  Abdominal:     General: Abdomen is flat. Bowel sounds are normal. There is no distension.     Palpations: Abdomen is soft.     Tenderness: There is abdominal tenderness. There is no guarding.    Neurological:     Mental Status: He is alert.           Assessment & Plan:  Colitis  Dehydration  Patient appears to have infectious colitis and dehydration and hypotension.  Hold losartan, amlodipine, and Flomax.  Treat colitis with Cipro and Flagyl 500 mg p.o. twice daily each.  Push fluids.  Patient was started on an IV and was given 1 L of normal saline today in the office.  Reassess in 24 hours or seek medical attention immediately if worsening.  Check CBC and CMP

## 2020-10-03 ENCOUNTER — Encounter: Payer: Self-pay | Admitting: Family Medicine

## 2020-10-06 ENCOUNTER — Encounter: Payer: Self-pay | Admitting: Family Medicine

## 2020-10-06 ENCOUNTER — Encounter: Payer: Self-pay | Admitting: Nurse Practitioner

## 2020-10-06 ENCOUNTER — Other Ambulatory Visit: Payer: Self-pay

## 2020-10-06 ENCOUNTER — Ambulatory Visit (INDEPENDENT_AMBULATORY_CARE_PROVIDER_SITE_OTHER): Payer: Medicare HMO | Admitting: Nurse Practitioner

## 2020-10-06 VITALS — BP 128/72 | HR 91 | Ht 68.0 in | Wt 178.8 lb

## 2020-10-06 DIAGNOSIS — M4800 Spinal stenosis, site unspecified: Secondary | ICD-10-CM | POA: Insufficient documentation

## 2020-10-06 DIAGNOSIS — G5620 Lesion of ulnar nerve, unspecified upper limb: Secondary | ICD-10-CM | POA: Insufficient documentation

## 2020-10-06 DIAGNOSIS — K529 Noninfective gastroenteritis and colitis, unspecified: Secondary | ICD-10-CM

## 2020-10-06 DIAGNOSIS — E860 Dehydration: Secondary | ICD-10-CM

## 2020-10-06 LAB — BASIC METABOLIC PANEL WITH GFR
BUN: 7 mg/dL (ref 7–25)
CO2: 30 mmol/L (ref 20–32)
Calcium: 8.9 mg/dL (ref 8.6–10.3)
Chloride: 102 mmol/L (ref 98–110)
Creat: 0.91 mg/dL (ref 0.70–1.18)
GFR, Est African American: 97 mL/min/{1.73_m2} (ref >=60)
GFR, Est Non African American: 83 mL/min/{1.73_m2} (ref >=60)
Glucose, Bld: 101 mg/dL — ABNORMAL HIGH (ref 65–99)
Potassium: 3.8 mmol/L (ref 3.5–5.3)
Sodium: 139 mmol/L (ref 135–146)

## 2020-10-06 LAB — CBC WITH DIFFERENTIAL/PLATELET
Absolute Monocytes: 874 cells/uL (ref 200–950)
Basophils Absolute: 104 cells/uL (ref 0–200)
Basophils Relative: 0.9 %
Eosinophils Absolute: 219 cells/uL (ref 15–500)
Eosinophils Relative: 1.9 %
HCT: 42.5 % (ref 38.5–50.0)
Hemoglobin: 14.1 g/dL (ref 13.2–17.1)
Lymphs Abs: 1932 cells/uL (ref 850–3900)
MCH: 28.2 pg (ref 27.0–33.0)
MCHC: 33.2 g/dL (ref 32.0–36.0)
MCV: 85 fL (ref 80.0–100.0)
MPV: 8.5 fL (ref 7.5–12.5)
Monocytes Relative: 7.6 %
Neutro Abs: 8372 cells/uL — ABNORMAL HIGH (ref 1500–7800)
Neutrophils Relative %: 72.8 %
Platelets: 391 10*3/uL (ref 140–400)
RBC: 5 10*6/uL (ref 4.20–5.80)
RDW: 13.2 % (ref 11.0–15.0)
Total Lymphocyte: 16.8 %
WBC: 11.5 10*3/uL — ABNORMAL HIGH (ref 3.8–10.8)

## 2020-10-06 NOTE — Progress Notes (Signed)
Subjective:    Patient ID: Brett [name], male    DOB: 1945-09-06, 75 y.o.   MRN: [id number]  HPI: Brett [name] is a 75 y.o. male presenting for "still not feeling well."  Chief Complaint  Patient presents with  . Follow-up    Still not feeling well., ab pain chills, diarrhea   ABDOMINAL PAIN  Only thinking has eaten is chicken/rice soup and cheese crackers since Thursday last week.  He is drinking Gatorade zero and "a lot" of water. Duration: 6 days Onset: gradual - comes and goes Severity: moderate to severe Quality: sharp Location:  LLQ, diffuse Episode duration: hours Radiation: no Frequency: intermittent Alleviating factors: antibiotics Aggravating factors: nothing Status: stable Treatments attempted: fluids - water,  Started having some formed BM yesterday, turned liquid again yesterday Fever: no; chills - yes Muscle aches: no Nausea: no Vomiting: no Weight loss: yes Decreased appetite: yes; Diarrhea: yes; dark brown liquid no blood Constipation: no Blood in stool: no Heartburn: no Jaundice: no Rash: no Dysuria/urinary frequency: no Hematuria: no   Allergies  Allergen Reactions  . Other   . Statins     myalgias  . Tramadol Nausea Only    Outpatient Encounter Medications as of 10/06/2020  Medication Sig  . amLODipine (NORVASC) 10 MG tablet TAKE 1 TABLET DAILY  . ciprofloxacin (CIPRO) 500 MG tablet Take 1 tablet (500 mg total) by mouth 2 (two) times daily for 10 days.  . colestipol (COLESTID) 1 g tablet TAKE 1 TABLET TWICE A DAY  . diclofenac (VOLTAREN) 75 MG EC tablet Take 1 tablet (75 mg total) by mouth 2 (two) times daily.  Marland Kitchen dutasteride (AVODART) 0.5 MG capsule Take 1 capsule (0.5 mg total) by mouth daily.  . fenofibrate (TRICOR) 48 MG tablet TAKE 1 TABLET DAILY  . losartan (COZAAR) 25 MG tablet TAKE 1 TABLET DAILY (Patient taking differently: Take 25 mg by mouth daily.)  . pantoprazole (PROTONIX) 40 MG tablet Take 1 tablet (40 mg total) by  mouth 2 (two) times daily.  . tamsulosin (FLOMAX) 0.4 MG CAPS capsule Take 2 capsules (0.8 mg total) by mouth daily.  Marland Kitchen triamcinolone (NASACORT) 55 MCG/ACT AERO nasal inhaler Place 2 sprays into the nose daily.  . [DISCONTINUED] metroNIDAZOLE (FLAGYL) 500 MG tablet Take 1 tablet (500 mg total) by mouth 2 (two) times daily for 10 days.  Marland Kitchen zolpidem (AMBIEN) 10 MG tablet Take 1 tablet (10 mg total) by mouth at bedtime as needed for sleep.   No facility-administered encounter medications on file as of 10/06/2020.    Patient Active Problem List   Diagnosis Date Noted  . Ulnar neuropathy 10/06/2020  . Stenosis of intervertebral foramina 10/06/2020  . BPH (benign prostatic hyperplasia) 07/29/2020  . Lumbar radiculopathy 06/24/2020  . Right leg paresthesias 03/10/2020  . DNR (do not resuscitate) 07/31/2019  . BPV (benign positional vertigo) 05/19/2018  . Pain in joint of right shoulder 05/31/2017  . S/P shoulder replacement, right 04/15/2017  . Osteoarthritis of right hip 12/13/2016  . Essential hypertension 04/10/2014  . Insomnia due to stress 04/10/2014  . DDD (degenerative disc disease), cervical 04/10/2014  . Mixed hyperlipidemia 04/13/2013  . GERD (gastroesophageal reflux disease) 04/13/2013  . OSA on CPAP 04/13/2013  . Chronic shoulder pain 04/13/2013    Past Medical History:  Diagnosis Date  . Allergy    seasonal  . Anemia 06/2019   iron deficiency  . Arthritis   . Cataract   . Diverticulosis   . GERD (  gastroesophageal reflux disease)   . Hiatal hernia 2014   Dr. Elisabeth Cara [facility])  . History of hiatal hernia   . Hyperlipidemia   . Hypertension   . IBS (irritable colon syndrome)   . Pneumonia    hx. of  . PONV (postoperative nausea and vomiting)    with gallbladder surgery  . SCC (squamous cell carcinoma) 01/02/2019   R temple-[id number]  . Sleep apnea    cpap  . Vertigo     Relevant past medical, surgical, family and social history reviewed and updated as indicated.  Interim medical history since our last visit reviewed.  Review of Systems Per HPI unless specifically indicated above     Objective:    BP 104/60   Pulse 91   Ht 5\' 8"  (1.727 m)   Wt 178 lb 12.8 oz (81.1 kg)   SpO2 97%   BMI 27.19 kg/m   Wt Readings from Last 3 Encounters:  10/06/20 178 lb 12.8 oz (81.1 kg)  10/02/20 181 lb (82.1 kg)  07/29/20 186 lb (84.4 kg)    Physical Exam Vitals and nursing note reviewed.  Constitutional:      General: He is not in acute distress.    Appearance: He is ill-appearing. He is not toxic-appearing.  HENT:     Head: Normocephalic and atraumatic.  Cardiovascular:     Heart sounds: Normal heart sounds. No murmur heard.   Pulmonary:     Effort: Pulmonary effort is normal. No respiratory distress.     Breath sounds: Normal breath sounds. No wheezing, rhonchi or rales.  Abdominal:     General: Abdomen is flat. Bowel sounds are normal.     Palpations: Abdomen is soft.     Tenderness: There is abdominal tenderness in the right lower quadrant and left lower quadrant. There is no right CVA tenderness or left CVA tenderness.  Musculoskeletal:        General: Normal range of motion.     Right lower leg: No edema.     Left lower leg: No edema.  Skin:    General: Skin is warm and dry.     Capillary Refill: Capillary refill takes less than 2 seconds.     Coloration: Skin is pale. Skin is not jaundiced.     Findings: No erythema.  Neurological:     Mental Status: He is alert and oriented to person, place, and time.     Gait: Gait normal.  Psychiatric:        Mood and Affect: Mood normal.        Thought Content: Thought content normal.        Judgment: Judgment normal.        Assessment & Plan:  1. Colitis Acute.  Ongoing.  1 L normal saline bolus given in office today due to ongoing hypotension likely from dehydration.  CBC and kidney function with electrolytes rechecked-stat.  Will obtain stool testing to rule out infectious cause.  We  will also obtain CT abdomen pelvis stat.  Continue oral antibiotics for now and continue to hold losartan, amlodipine, and Flomax.  Continue to push fluids. discussed ER precautions-if diarrhea persists or nausea/vomiting and unable to keep fluids down seek emergent care.  Follow-up in 3 days.  - BASIC METABOLIC PANEL WITH GFR - CBC with Differential/Platelet - C. difficile GDH and Toxin A/B - Salmonella/Shigella Cult, Campy EIA and Shiga Toxin reflex - Ova and parasite examination - CT Abdomen Pelvis Wo Contrast; Future  2. Dehydration Acute.  Ongoing.  1 L normal saline bolus given in office today due to ongoing hypotension likely from dehydration.  CBC and kidney function with electrolytes rechecked-stat.  Will obtain stool testing to rule out infectious cause.  We will also obtain CT abdomen pelvis stat.  Continue oral antibiotics for now and continue to hold losartan, amlodipine, and Flomax.  Continue to push fluids. discussed ER precautions-if diarrhea persists or nausea/vomiting and unable to keep fluids down seek emergent care.  Follow-up in 3 days.  - BASIC METABOLIC PANEL WITH GFR - CBC with Differential/Platelet - C. difficile GDH and Toxin A/B - Salmonella/Shigella Cult, Campy EIA and Shiga Toxin reflex - Ova and parasite examination     Follow up plan: Return in about 3 days (around 10/09/2020) for abdominal pain f/u.

## 2020-10-07 ENCOUNTER — Other Ambulatory Visit: Payer: Medicare HMO

## 2020-10-08 ENCOUNTER — Other Ambulatory Visit: Payer: Self-pay

## 2020-10-08 ENCOUNTER — Ambulatory Visit
Admission: RE | Admit: 2020-10-08 | Discharge: 2020-10-08 | Disposition: A | Discharge status: Home / Self Care | Payer: Medicare HMO | Source: Ambulatory Visit | Attending: Nurse Practitioner | Admitting: Nurse Practitioner

## 2020-10-08 DIAGNOSIS — K529 Noninfective gastroenteritis and colitis, unspecified: Secondary | ICD-10-CM

## 2020-10-08 LAB — C. DIFFICILE GDH AND TOXIN A/B
GDH ANTIGEN: NOT DETECTED
MICRO NUMBER:: 11928256
SPECIMEN QUALITY:: ADEQUATE
TOXIN A AND B: NOT DETECTED

## 2020-10-09 ENCOUNTER — Encounter: Payer: Self-pay | Admitting: Family Medicine

## 2020-10-09 ENCOUNTER — Encounter: Payer: Self-pay | Admitting: Nurse Practitioner

## 2020-10-09 ENCOUNTER — Ambulatory Visit (INDEPENDENT_AMBULATORY_CARE_PROVIDER_SITE_OTHER): Payer: Medicare HMO | Admitting: Family Medicine

## 2020-10-09 VITALS — BP 104/60 | HR 58 | Temp 98.9°F | Resp 14 | Ht 68.0 in | Wt 180.0 lb

## 2020-10-09 DIAGNOSIS — A084 Viral intestinal infection, unspecified: Secondary | ICD-10-CM | POA: Diagnosis not present

## 2020-10-09 DIAGNOSIS — I700 Atherosclerosis of aorta: Secondary | ICD-10-CM | POA: Insufficient documentation

## 2020-10-09 LAB — SALMONELLA/SHIGELLA CULT, CAMPY EIA AND SHIGA TOXIN RFL ECOLI
MICRO NUMBER: 11928047
MICRO NUMBER:: 11928048
MICRO NUMBER:: 11928050
Result:: NOT DETECTED
SHIGA RESULT:: NOT DETECTED
SPECIMEN QUALITY: ADEQUATE
SPECIMEN QUALITY:: ADEQUATE
SPECIMEN QUALITY:: ADEQUATE

## 2020-10-09 LAB — OVA AND PARASITE EXAMINATION
CONCENTRATE RESULT:: NONE SEEN
MICRO NUMBER:: 11928049
SPECIMEN QUALITY:: ADEQUATE
TRICHROME RESULT:: NONE SEEN

## 2020-10-09 MED ORDER — DIPHENOXYLATE-ATROPINE 2.5-0.025 MG PO TABS: 2.0000 | ORAL_TABLET | Freq: Four times a day (QID) | ORAL | 0 refills | Status: DC | PRN

## 2020-10-09 NOTE — Progress Notes (Signed)
Subjective:    Patient ID: Brett [name], male    DOB: 1945-10-05, 75 y.o.   MRN: [id number]  HPI  10/02/20 Patient started feeling sick earlier this week.  His wife was diagnosed with a stomach flu on Tuesday.  Patient reports a fever to 101 at home.  He reports lower abdominal pain.  The pain is worse in the suprapubic area and left lower quadrant.  He reports frequent watery diarrhea.  He reports intestinal spasm and cramps.  He reports some nausea but no vomiting.  He denies any chest pain.  He denies any cough.  He denies any shortness of breath.  He denies any rhinorrhea or sore throat or sinus pain.  On exam today, he is tender to palpation in the suprapubic area and left lower quadrant.  Differential diagnosis includes colitis.  At that time, my plan was: Patient appears to have infectious colitis and dehydration and hypotension.  Hold losartan, amlodipine, and Flomax.  Treat colitis with Cipro and Flagyl 500 mg p.o. twice daily each.  Push fluids.  Patient was started on an IV and was given 1 L of normal saline today in the office.  Reassess in 24 hours or seek medical attention immediately if worsening.  Check CBC and CMP  10/09/20 Patient continued to decline and he came back and saw my partner and required more IV fluids.  At that time she obtained a CT scan of the abdomen which showed no colitis or acute intra-abdominal pathology.  She also check the patient for C. difficile as well as ova and parasites all of which was negative.  Patient states that he is doing better now.  The diarrhea has slowed down to 4 times a day.  The abdominal pain has improved.  He denies any nausea or vomiting.  Blood pressure is better however he still not taking his blood pressure medication.  He is asking to resume Flomax as he is having trouble with urinary retention Past Medical History:  Diagnosis Date  . Allergy    seasonal  . Anemia 06/2019   iron deficiency  . Arthritis   . Cataract   .  Diverticulosis   . GERD (gastroesophageal reflux disease)   . Hiatal hernia 2014   Dr. Elisabeth [facility])  . History of hiatal hernia   . Hyperlipidemia   . Hypertension   . IBS (irritable colon syndrome)   . Pneumonia    hx. of  . PONV (postoperative nausea and vomiting)    with gallbladder surgery  . SCC (squamous cell carcinoma) 01/02/2019   R temple-txpbx  . Sleep apnea    cpap  . Vertigo    Past Surgical History:  Procedure Laterality Date  . CARPAL TUNNEL RELEASE Bilateral   . CHOLECYSTECTOMY  2004   Post-cholecystectomy syndrome   . COLONOSCOPY    . ESOPHAGOGASTRODUODENOSCOPY    . EYE SURGERY     cataract  . FRACTURE SURGERY  2006   fx. wrist  . JOINT REPLACEMENT    . KNEE SURGERY Left 2020  . rhino plasty     deviated septum  . SHOULDER SURGERY  2011   rotator cuff  . TOTAL HIP ARTHROPLASTY Right 12/13/2016   Procedure: RIGHT TOTAL HIP ARTHROPLASTY ANTERIOR APPROACH;  Surgeon: Samson Frederic, MD;  Location: [facility] OR;  Service: Orthopedics;  Laterality: Right;  . TOTAL SHOULDER ARTHROPLASTY Right 04/15/2017   Procedure: RIGHT TOTAL SHOULDER ARTHROPLASTY;  Surgeon: Beverely Low, MD;  Location: [facility] OR;  Service:  Orthopedics;  Laterality: Right;  . TYMPANOSTOMY TUBE PLACEMENT Left 2020  . UPPER GASTROINTESTINAL ENDOSCOPY    . VASECTOMY  1980   Current Outpatient Medications on File Prior to Visit  Medication Sig Dispense Refill  . ciprofloxacin (CIPRO) 500 MG tablet Take 1 tablet (500 mg total) by mouth 2 (two) times daily for 10 days. 20 tablet 0  . colestipol (COLESTID) 1 g tablet TAKE 1 TABLET TWICE A DAY 180 tablet 3  . diclofenac (VOLTAREN) 75 MG EC tablet Take 1 tablet (75 mg total) by mouth 2 (two) times daily. 90 tablet 0  . dutasteride (AVODART) 0.5 MG capsule Take 1 capsule (0.5 mg total) by mouth daily. 90 capsule 1  . fenofibrate (TRICOR) 48 MG tablet TAKE 1 TABLET DAILY 90 tablet 3  . pantoprazole (PROTONIX) 40 MG tablet Take 1 tablet (40 mg total) by mouth  2 (two) times daily. 180 tablet 3  . triamcinolone (NASACORT) 55 MCG/ACT AERO nasal inhaler Place 2 sprays into the nose daily.    Marland Kitchen zolpidem (AMBIEN) 10 MG tablet Take 1 tablet (10 mg total) by mouth at bedtime as needed for sleep. 15 tablet 1  . amLODipine (NORVASC) 10 MG tablet TAKE 1 TABLET DAILY (Patient not taking: Reported on 10/09/2020) 90 tablet 3  . losartan (COZAAR) 25 MG tablet TAKE 1 TABLET DAILY (Patient not taking: Reported on 10/09/2020) 90 tablet 3  . tamsulosin (FLOMAX) 0.4 MG CAPS capsule Take 2 capsules (0.8 mg total) by mouth daily. (Patient not taking: Reported on 10/09/2020) 180 capsule 3   No current facility-administered medications on file prior to visit.   Allergies  Allergen Reactions  . Other   . Statins     myalgias  . Tramadol Nausea Only   Social History   Socioeconomic History  . Marital status: Married    Spouse name: Not on file  . Number of children: Not on file  . Years of education: Not on file  . Highest education level: Not on file  Occupational History  . Not on file  Tobacco Use  . Smoking status: Former Smoker    Packs/day: 2.00    Years: 51.00    Pack years: 102.00    Types: Cigarettes, Cigars    Start date: 1963    Quit date: 2012    Years since quitting: 10.4  . Smokeless tobacco: Never Used  Vaping Use  . Vaping Use: Never used  Substance and Sexual Activity  . Alcohol use: No  . Drug use: No  . Sexual activity: Yes  Other Topics Concern  . Not on file  Social History Narrative  . Not on file   Social Determinants of Health   Financial Resource Strain: Not on file  Food Insecurity: Not on file  Transportation Needs: Not on file  Physical Activity: Not on file  Stress: Not on file  Social Connections: Not on file  Intimate Partner Violence: Not on file      Review of Systems  All other systems reviewed and are negative.      Objective:   Physical Exam Vitals reviewed.  Constitutional:      General: He is  not in acute distress.    Appearance: He is normal weight. He is not ill-appearing or toxic-appearing.  HENT:     Right Ear: Tympanic membrane and ear canal normal.     Left Ear: Tympanic membrane and ear canal normal.  Cardiovascular:     Rate and Rhythm: Normal rate  and regular rhythm.     Heart sounds: Normal heart sounds.  Pulmonary:     Effort: Pulmonary effort is normal. No respiratory distress.     Breath sounds: Normal breath sounds. No wheezing, rhonchi or rales.  Abdominal:     General: Abdomen is flat. Bowel sounds are normal. There is no distension.     Palpations: Abdomen is soft.     Tenderness: There is no abdominal tenderness. There is no guarding.  Neurological:     Mental Status: He is alert.           Assessment & Plan:  Viral colitis  I believe the patient has some type of viral colitis such as norovirus.  However clinically he is starting to improve.  Continue to hold amlodipine and losartan.  Patient can resume Flomax but if his blood pressure gets too low he will need to stop it.  I have asked him to liberalize salt and eat canned soup, etc. in an effort to try to raise his blood pressure.  Continue to encourage him to drink Gatorade.  Discontinue Cipro and Flagyl as I believe these are not helping at all.  Check blood pressure daily.  Once blood pressure rises above 140/90, I would resume losartan.  Gradually resume amlodipine afterward if blood pressure continues to remain greater than 140/90.  I anticipate 1 to 2 weeks before he will be back on blood pressure medication.

## 2020-10-11 IMAGING — MR MR CERVICAL SPINE W/O CM
5 series · 35 of 48 positions shown · non-contrast
Comparison: None available.

CLINICAL DATA: Initial evaluation for degenerative disc disease and
osteoarthritis.

EXAM:
MRI CERVICAL SPINE WITHOUT CONTRAST
TECHNIQUE: Multiplanar, multisequence MR imaging of the cervical spine was
performed. No intravenous contrast was administered.

[Series 2: T2 · sagittal · 3.0mm · 0.41mm/px · 8 of 19 slices shown (1 of 2)]
[im 1/19]
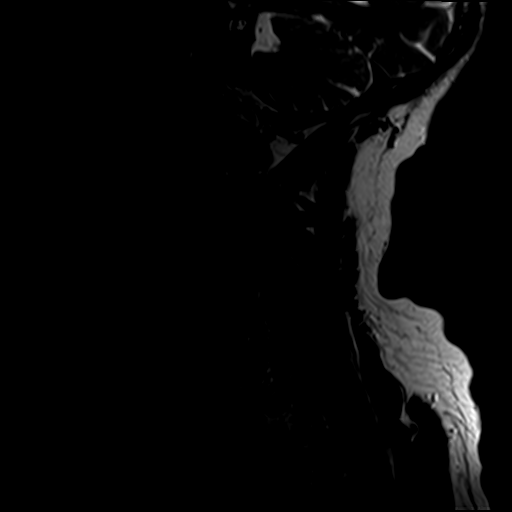
[im 3/19]
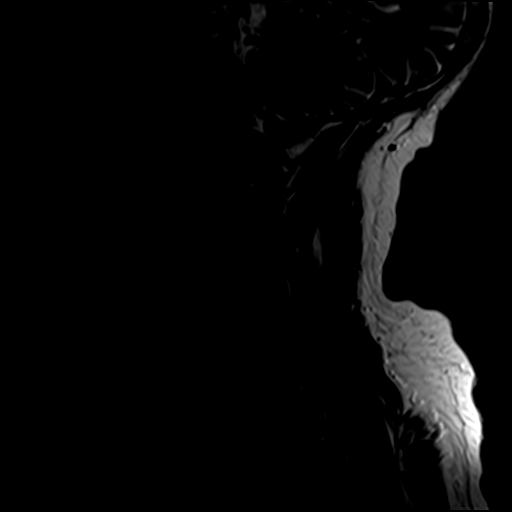
[im 6/19]
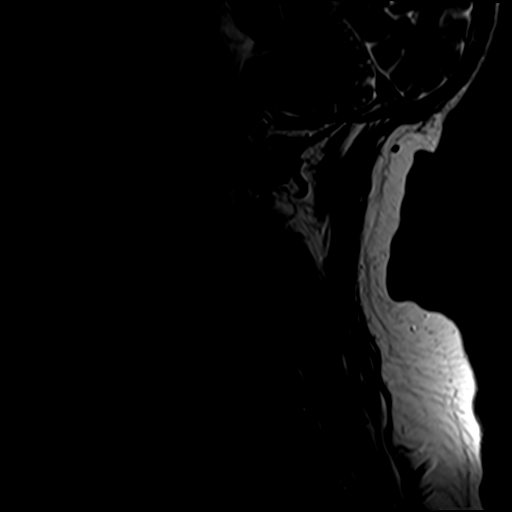
[im 8/19]
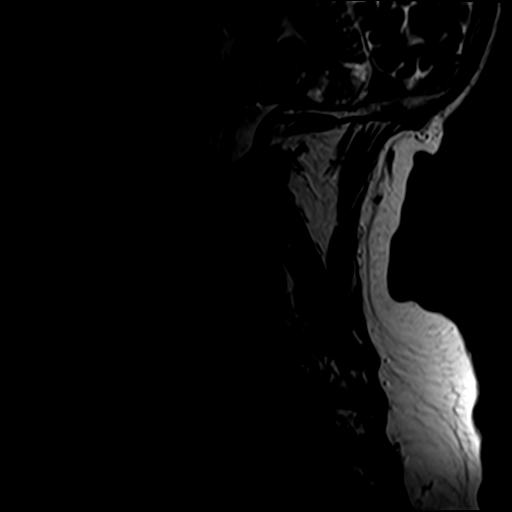
[im 11/19]
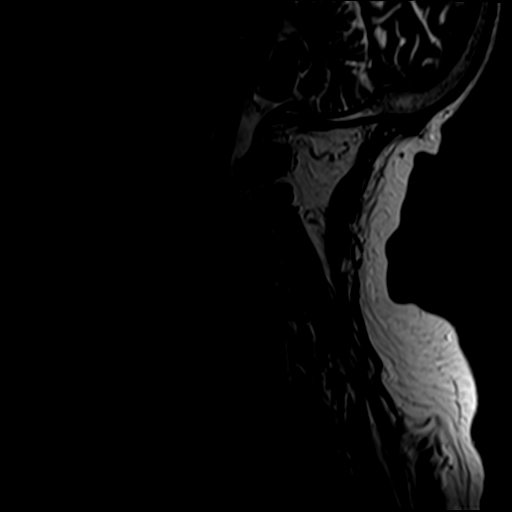
[im 13/19]
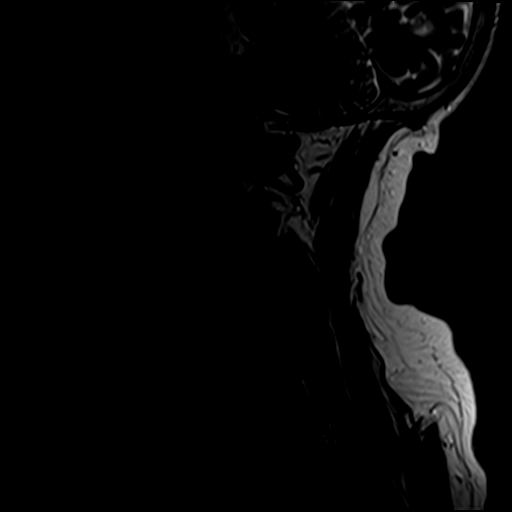
[im 16/19]
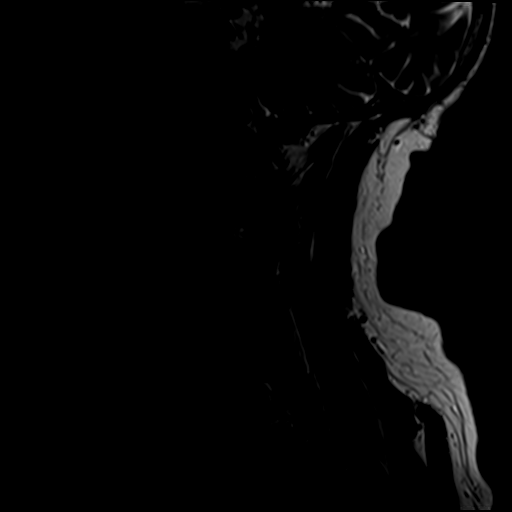
[im 19/19]
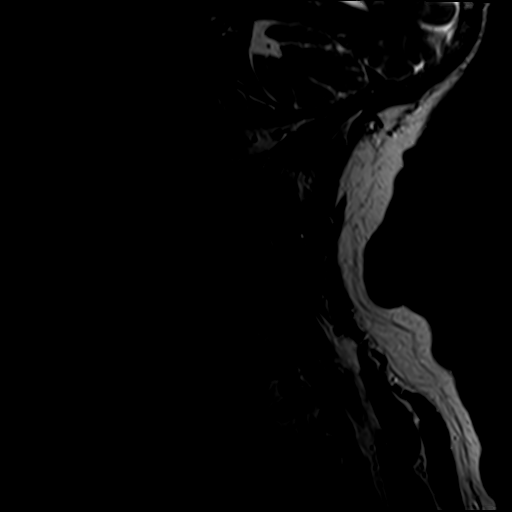

[Series 3: STIR · sagittal · 3.0mm · 0.82mm/px · 8 of 19 slices shown]
[im 1/19]
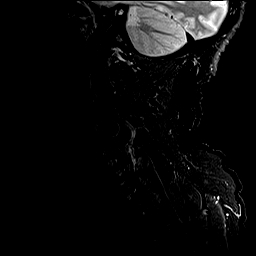
[im 3/19]
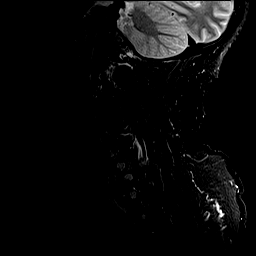
[im 6/19]
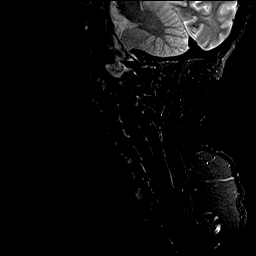
[im 8/19]
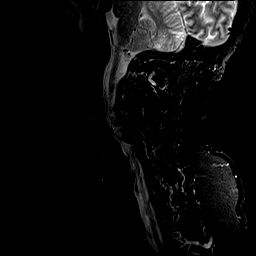
[im 11/19]
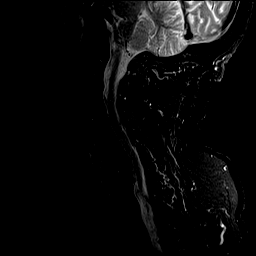
[im 13/19]
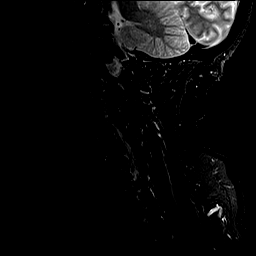
[im 16/19]
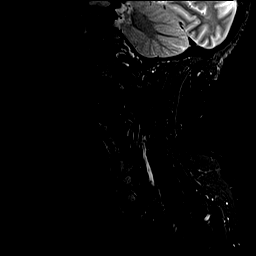
[im 19/19]
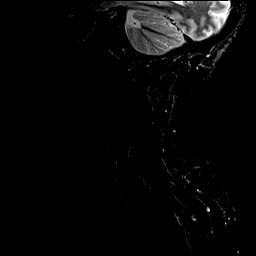

[Series 4: T1 · sagittal · 3.0mm · 0.82mm/px · 8 of 19 slices shown]
[im 1/19]
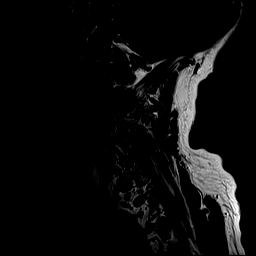
[im 3/19]
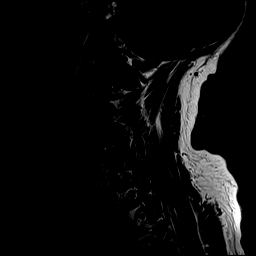
[im 6/19]
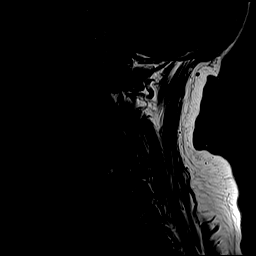
[im 8/19]
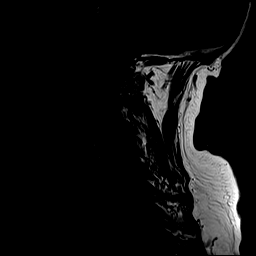
[im 11/19]
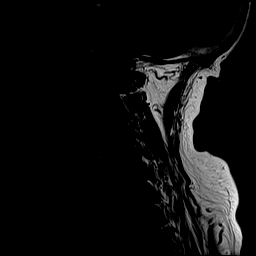
[im 13/19]
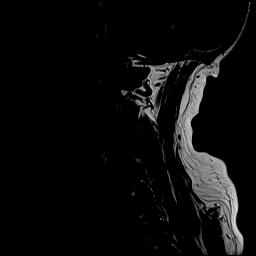
[im 16/19]
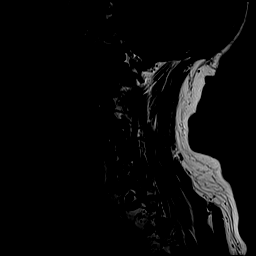
[im 19/19]
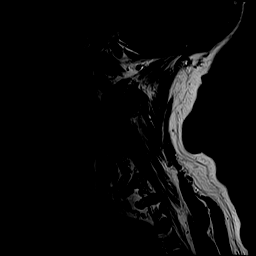

[Series 5: T2 · axial · 3.0mm · 0.70mm/px · z∈[-85,+13]mm · 9 of 28 slices shown (2 of 2)]
[im 1/28]
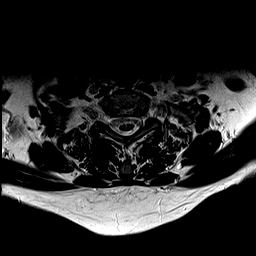
[im 5/28]
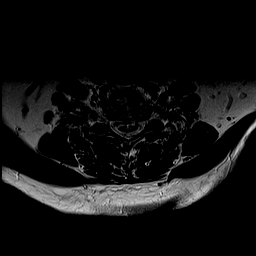
[im 8/28]
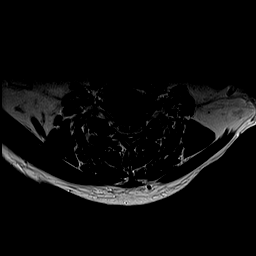
[im 13/28]
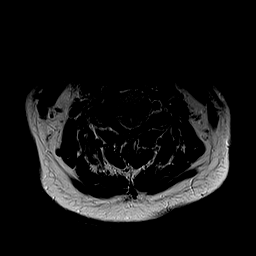
[im 15/28]
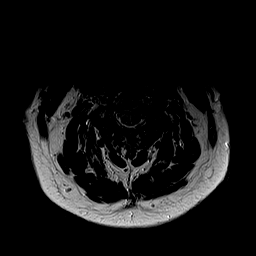
[im 20/28]
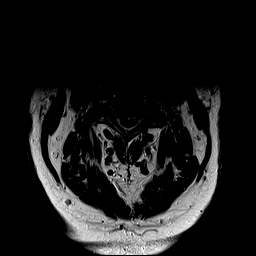
[im 23/28]
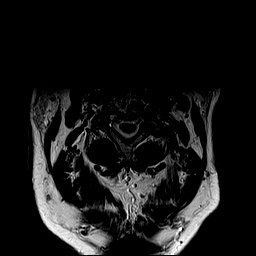
[im 25/28]
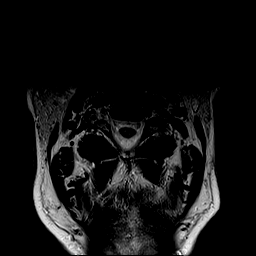
[im 28/28]
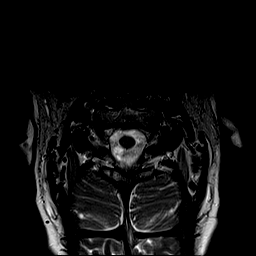

[Series 6: GRE · axial · 3.0mm · 0.35mm/px · z∈[-85,-70]mm · 2 of 28 slices shown]
[im 1/28]
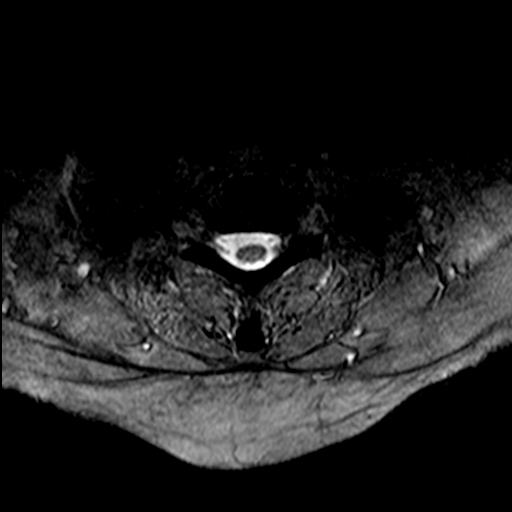
[im 5/28]
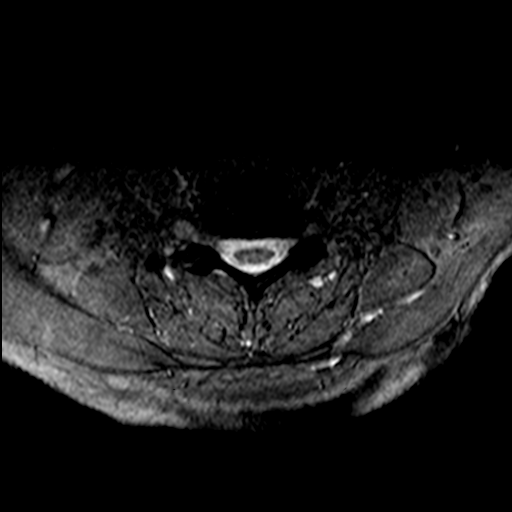

[35 of 48 positions shown; findings below may reference images not displayed]

FINDINGS: Alignment: Reversal of the normal cervical lordosis with apex at
C6-7. Trace anterolisthesis of C3 on C4 through C6 on C7, chronic
and facet mediated.

Vertebrae: Vertebral body height maintained without evidence for
acute or chronic fracture. Bone marrow signal intensity within
normal limits. Few small benign hemangiomata noted within the C7 and
T1 vertebral bodies. No worrisome osseous lesions. Reactive endplate
changes noted about the C7-T1 interspace. Minimal reactive marrow
edema noted about the right C5-6 facet due to facet arthritis. No
other abnormal marrow edema.

Cord: Signal intensity within the cervical spinal cord is within
normal limits.

Posterior Fossa, vertebral arteries, paraspinal tissues: Visualized
brain and posterior fossa normal. Craniocervical junction within
normal limits. Paraspinous and prevertebral soft tissues within
normal limits. Normal flow voids seen within the vertebral arteries
bilaterally.

Disc levels:

C2-C3: Mild uncovertebral hypertrophy without significant disc
bulge. Moderate left with mild right facet degeneration. No spinal
stenosis. Mild left C3 foraminal narrowing.

C3-C4: Shallow central disc protrusion mildly indents the ventral
thecal sac. Moderate right with mild left facet degeneration with
mild bilateral uncovertebral hypertrophy. No significant spinal
stenosis. Moderate bilateral C4 foraminal narrowing.

C4-C5: Minimal disc bulge. Moderate facet and ligament flavum
hypertrophy, greater on the right. No significant spinal stenosis.
Mild to moderate bilateral C5 foraminal narrowing.

C5-C6: Anterolisthesis with mild disc bulge. Right greater than left
uncovertebral and facet degeneration. Mild ligament flavum
thickening. Resultant mild spinal stenosis without cord impingement.
Mild to moderate right C6 foraminal narrowing. Left neural foramina
remains patent.

C6-C7: Diffuse disc bulge with bilateral uncovertebral hypertrophy.
Broad posterior disc osteophyte indents and partially faces the
ventral thecal sac resultant mild spinal stenosis. Mild cord
flattening without cord signal changes. Mild to moderate bilateral
C7 foraminal narrowing.

C7-T1: Degenerative intervertebral disc space narrowing with diffuse
degenerative disc osteophyte. Flattening and partial effacement of
the ventral thecal sac without significant spinal stenosis or cord
deformity. Left greater than right uncovertebral hypertrophy with
resultant mild left C8 foraminal stenosis. No significant right
foraminal narrowing.

Visualized upper thoracic spine demonstrates no significant finding.
IMPRESSION: 1. Multilevel cervical spondylosis with resultant mild spinal
stenosis at C5-6 and C6-7.
2. Multifactorial degenerative changes with resultant multilevel
foraminal narrowing as above. Notable findings include moderate
bilateral C4 foraminal stenosis, mild to moderate bilateral C5,
right C6, and bilateral C7 foraminal narrowing.
3. Moderate multilevel facet degeneration throughout the cervical
spine, most pronounced at C3-4 and C4-5 on the right. Findings could
contribute to underlying neck pain.

## 2020-11-18 ENCOUNTER — Encounter: Payer: Self-pay | Admitting: Nurse Practitioner

## 2020-11-18 DIAGNOSIS — M5416 Radiculopathy, lumbar region: Secondary | ICD-10-CM

## 2020-11-19 NOTE — Telephone Encounter (Signed)
Please inquire - is he requesting we refer him to another neurosurgeon or a specific practice/group for second opinion?

## 2020-12-12 ENCOUNTER — Encounter (HOSPITAL_COMMUNITY): Payer: Self-pay

## 2020-12-12 ENCOUNTER — Emergency Department (HOSPITAL_COMMUNITY)
Admission: EM | Admit: 2020-12-12 | Discharge: 2020-12-12 | Disposition: A | Discharge status: Home / Self Care | Payer: Medicare HMO | Attending: Emergency Medicine | Admitting: Emergency Medicine

## 2020-12-12 ENCOUNTER — Other Ambulatory Visit: Payer: Self-pay

## 2020-12-12 DIAGNOSIS — G8929 Other chronic pain: Secondary | ICD-10-CM | POA: Diagnosis not present

## 2020-12-12 DIAGNOSIS — Z85828 Personal history of other malignant neoplasm of skin: Secondary | ICD-10-CM | POA: Insufficient documentation

## 2020-12-12 DIAGNOSIS — Z96641 Presence of right artificial hip joint: Secondary | ICD-10-CM | POA: Diagnosis not present

## 2020-12-12 DIAGNOSIS — M5442 Lumbago with sciatica, left side: Secondary | ICD-10-CM | POA: Diagnosis not present

## 2020-12-12 DIAGNOSIS — Z96611 Presence of right artificial shoulder joint: Secondary | ICD-10-CM | POA: Insufficient documentation

## 2020-12-12 DIAGNOSIS — I10 Essential (primary) hypertension: Secondary | ICD-10-CM | POA: Diagnosis not present

## 2020-12-12 DIAGNOSIS — R202 Paresthesia of skin: Secondary | ICD-10-CM | POA: Insufficient documentation

## 2020-12-12 DIAGNOSIS — Z87891 Personal history of nicotine dependence: Secondary | ICD-10-CM | POA: Insufficient documentation

## 2020-12-12 DIAGNOSIS — M5432 Sciatica, left side: Secondary | ICD-10-CM

## 2020-12-12 DIAGNOSIS — M545 Low back pain, unspecified: Secondary | ICD-10-CM | POA: Diagnosis present

## 2020-12-12 MED ORDER — OXYCODONE-ACETAMINOPHEN 5-325 MG PO TABS: 1.0000 | ORAL_TABLET | Freq: Three times a day (TID) | ORAL | 0 refills | Status: AC | PRN

## 2020-12-12 MED ORDER — HYDROMORPHONE HCL 1 MG/ML IJ SOLN: 1.0000 mg | Freq: Once | INTRAMUSCULAR | Status: DC

## 2020-12-12 MED ORDER — OXYCODONE-ACETAMINOPHEN 5-325 MG PO TABS
1.0000 | ORAL_TABLET | ORAL | Status: DC | PRN
  Administered 2020-12-12: 1 via ORAL
  Filled 2020-12-12: qty 1

## 2020-12-12 MED ORDER — METHYLPREDNISOLONE 4 MG PO TBPK: ORAL_TABLET | ORAL | 0 refills | Status: DC

## 2020-12-12 NOTE — Discharge Instructions (Addendum)
Please pick up medrol dose pack (steroids) and percocet at your pharmacy. Advised to return to the ED if you develop significant worsening of your pain, fever, or urinary incontinence,

## 2020-12-12 NOTE — ED Provider Notes (Signed)
[facility] EMERGENCY DEPARTMENT Provider Note   CSN: [id number] Arrival date & time: 12/12/20  [id number]     History Chief Complaint  Patient presents with   Back Pain    Brett [name] is a 75 y.o. male with a PMHx of HTN, HLD, and ostearthritis of the lumbar spine presenting to the ED for low back pain.  Patient has a history of osteoarthritis in his lumbar spine and chronic low back pain for which he follows with Dr. Christella Noa. He last received an injection in his back about 2 weeks ago which has helped to control his pain, however, when he woke up this morning the pain was unbearable and he had pain while ambulating.  Denies any injury or falls. Patient has an appointment with Dr. Christella Noa on Tuesday however he could not wait to see him , and thus presented to the ED.  Pain was a 10/10 initially and reduced to a 7/10 after being given Percocet. Patient states that he has chronic numbness and tingling down his right leg however it is now present in his left leg. Patient denies any fevers, chills, chest pain, shortness of breath, abdominal pain, nausea, vomiting, diarrhea, saddle anesthesia, urinary incontinence, or any other symptoms at this time.   The history is provided by the patient.  Back Pain Location:  Lumbar spine Quality:  Shooting and stabbing Radiates to:  L thigh Pain severity:  Severe Pain is:  Unable to specify Onset quality:  Gradual Timing:  Constant Chronicity:  Chronic Context: not falling, not MVA and not recent injury   Relieved by:  Narcotics Worsened by:  Ambulation Associated symptoms: numbness and paresthesias   Associated symptoms: no abdominal pain, no bladder incontinence, no bowel incontinence, no chest pain, no dysuria, no fever, no headaches, no perianal numbness and no weakness       Past Medical History:  Diagnosis Date   Allergy    seasonal   Anemia 06/2019   iron deficiency   Arthritis    Cataract    Diverticulosis    GERD  (gastroesophageal reflux disease)    Hiatal hernia 2014   Dr. Lyda Perone [facility])   History of hiatal hernia    Hyperlipidemia    Hypertension    IBS (irritable colon syndrome)    Pneumonia    hx. of   PONV (postoperative nausea and vomiting)    with gallbladder surgery   SCC (squamous cell carcinoma) 01/02/2019   R temple-txpbx   Sleep apnea    cpap   Vertigo     Patient Active Problem List   Diagnosis Date Noted   Aortic atherosclerosis ([place]) 10/09/2020   Ulnar neuropathy 10/06/2020   Stenosis of intervertebral foramina 10/06/2020   BPH (benign prostatic hyperplasia) 07/29/2020   Lumbar radiculopathy 06/24/2020   Right leg paresthesias 03/10/2020   DNR (do not resuscitate) 07/31/2019   BPV (benign positional vertigo) 05/19/2018   Pain in joint of right shoulder 05/31/2017   S/P shoulder replacement, right 04/15/2017   Osteoarthritis of right hip 12/13/2016   Essential hypertension 04/10/2014   Insomnia due to stress 04/10/2014   DDD (degenerative disc disease), cervical 04/10/2014   Mixed hyperlipidemia 04/13/2013   GERD (gastroesophageal reflux disease) 04/13/2013   OSA on CPAP 04/13/2013   Chronic shoulder pain 04/13/2013    Past Surgical History:  Procedure Laterality Date   CARPAL TUNNEL RELEASE Bilateral    CHOLECYSTECTOMY  2004   Post-cholecystectomy syndrome    COLONOSCOPY  ESOPHAGOGASTRODUODENOSCOPY     EYE SURGERY     cataract   FRACTURE SURGERY  2006   fx. wrist   JOINT REPLACEMENT     KNEE SURGERY Left 2020   rhino plasty     deviated septum   SHOULDER SURGERY  2011   rotator cuff   TOTAL HIP ARTHROPLASTY Right 12/13/2016   Procedure: RIGHT TOTAL HIP ARTHROPLASTY ANTERIOR APPROACH;  Surgeon: Rod Can, MD;  Location: [place];  Service: Orthopedics;  Laterality: Right;   TOTAL SHOULDER ARTHROPLASTY Right 04/15/2017   Procedure: RIGHT TOTAL SHOULDER ARTHROPLASTY;  Surgeon: Netta Cedars, MD;  Location: [place];  Service: Orthopedics;  Laterality:  Right;   TYMPANOSTOMY TUBE PLACEMENT Left 2020   UPPER GASTROINTESTINAL ENDOSCOPY     VASECTOMY  1980       Family History  Problem Relation Age of Onset   Cancer Sister        uterine    Cancer Maternal Grandmother    Colon cancer Neg Hx    Colon polyps Neg Hx    Esophageal cancer Neg Hx    Rectal cancer Neg Hx    Stomach cancer Neg Hx     Social History   Tobacco Use   Smoking status: Former    Packs/day: 2.00    Years: 51.00    Pack years: 102.00    Types: Cigarettes, Cigars    Start date: 1963    Quit date: 2012    Years since quitting: 10.5   Smokeless tobacco: Never  Vaping Use   Vaping Use: Never used  Substance Use Topics   Alcohol use: No   Drug use: No    Home Medications Prior to Admission medications   Medication Sig Start Date End Date Taking? Authorizing Provider  methylPREDNISolone (MEDROL DOSEPAK) 4 MG TBPK tablet Use as directed 12/12/20  Yes Venora Kautzman N, DO  oxyCODONE-acetaminophen (PERCOCET) 5-325 MG tablet Take 1 tablet by mouth every 8 (eight) hours as needed for up to 3 days for severe pain. 12/12/20 12/15/20 Yes Harumi Yamin N, DO  amLODipine (NORVASC) 10 MG tablet TAKE 1 TABLET DAILY Patient not taking: Reported on 10/09/2020 09/19/20   Eulogio Bear, NP  colestipol (COLESTID) 1 g tablet TAKE 1 TABLET TWICE A DAY 06/09/20   [place], Modena Nunnery, MD  diclofenac (VOLTAREN) 75 MG EC tablet Take 1 tablet (75 mg total) by mouth 2 (two) times daily. 08/21/20   Eulogio Bear, NP  diphenoxylate-atropine (LOMOTIL) 2.5-0.025 MG tablet Take 2 tablets by mouth 4 (four) times daily as needed for diarrhea or loose stools. 10/09/20   Susy Frizzle, MD  dutasteride (AVODART) 0.5 MG capsule Take 1 capsule (0.5 mg total) by mouth daily. 07/29/20   Eulogio Bear, NP  fenofibrate (TRICOR) 48 MG tablet TAKE 1 TABLET DAILY 07/08/20   Alycia Rossetti, MD  losartan (COZAAR) 25 MG tablet TAKE 1 TABLET DAILY Patient not taking: Reported on 10/09/2020  03/31/20   Alycia Rossetti, MD  pantoprazole (PROTONIX) 40 MG tablet Take 1 tablet (40 mg total) by mouth 2 (two) times daily. 04/28/20   Alycia Rossetti, MD  tamsulosin (FLOMAX) 0.4 MG CAPS capsule Take 2 capsules (0.8 mg total) by mouth daily. Patient not taking: Reported on 10/09/2020 01/14/20   Alycia Rossetti, MD  triamcinolone (NASACORT) 55 MCG/ACT AERO nasal inhaler Place 2 sprays into the nose daily.    [provider]  zolpidem (AMBIEN) 10 MG tablet Take 1 tablet (10  mg total) by mouth at bedtime as needed for sleep. 07/29/20 08/28/20  Eulogio Bear, NP    Allergies    Other, Statins, and Tramadol  Review of Systems   Review of Systems  Constitutional:  Negative for chills and fever.  HENT:  Negative for rhinorrhea, sinus pressure and tinnitus.   Respiratory:  Negative for cough and shortness of breath.   Cardiovascular:  Negative for chest pain and palpitations.  Gastrointestinal:  Negative for abdominal pain, bowel incontinence, diarrhea, nausea and vomiting.  Genitourinary:  Negative for bladder incontinence, dysuria and hematuria.  Musculoskeletal:  Positive for back pain. Negative for gait problem and neck pain.  Neurological:  Positive for numbness and paresthesias. Negative for syncope, weakness and headaches.  Psychiatric/Behavioral:  The patient is hyperactive.    Physical Exam Updated Vital Signs BP (!) 142/66   Pulse (!) 54   Temp 98.4 F (36.9 C) (Oral)   Resp 17   SpO2 100%   Physical Exam Constitutional:      General: He is not in acute distress.    Appearance: Normal appearance.  HENT:     Head: Normocephalic and atraumatic.  Eyes:     Pupils: Pupils are equal, round, and reactive to light.  Cardiovascular:     Rate and Rhythm: Normal rate and regular rhythm.     Pulses: Normal pulses.     Heart sounds: Normal heart sounds. No murmur heard. Pulmonary:     Effort: Pulmonary effort is normal. No respiratory distress.     Breath  sounds: Normal breath sounds. No wheezing, rhonchi or rales.  Abdominal:     General: Bowel sounds are normal. There is no distension.     Palpations: Abdomen is soft.     Tenderness: There is no abdominal tenderness.  Musculoskeletal:        General: No swelling.     Right lower leg: No edema.     Left lower leg: No edema.     Comments: Positive left straight leg test  Skin:    General: Skin is warm and dry.  Neurological:     General: No focal deficit present.     Mental Status: He is alert and oriented to person, place, and time. Mental status is at baseline.     Motor: No weakness.     Gait: Gait normal.  Psychiatric:        Mood and Affect: Mood normal.        Behavior: Behavior normal.    ED Results / Procedures / Treatments   Labs (all labs ordered are listed, but only abnormal results are displayed) Labs Reviewed - No data to display   EKG None  Radiology No results found.  Procedures Procedures   Medications Ordered in ED Medications  oxyCODONE-acetaminophen (PERCOCET/ROXICET) 5-325 MG per tablet 1 tablet (1 tablet Oral Given 12/12/20 1017)    ED Course  I have reviewed the triage vital signs and the nursing notes.  Pertinent labs & imaging results that were available during my care of the patient were reviewed by me and considered in my medical decision making (see chart for details).    MDM Rules/Calculators/A&P                          Patient is a 75 yo male with hx of lumbar osteoarthritis presenting for low back pain. He has not had any recent injury or falls and denies any fever, chills,  urinary incontinence, saddle anesthesia, or any other red flag sxs at this time, thus low suspicion for epidural abscess or cauda equina. Had shared decision making conversation with the patient about ordering an MRI, however, he does not wish to wait to get this done here. Patient will follow up with Dr. Christella Noa on Tuesday as scheduled. Patient given medrol dose pack  and percocet for the pain.  Final Clinical Impression(s) / ED Diagnoses Final diagnoses:  Sciatica of left side  Chronic left-sided low back pain with left-sided sciatica    Rx / DC Orders ED Discharge Orders          Ordered    methylPREDNISolone (MEDROL DOSEPAK) 4 MG TBPK tablet        12/12/20 1557    oxyCODONE-acetaminophen (PERCOCET) 5-325 MG tablet  Every 8 hours PRN        12/12/20 1557             Yuktha Kerchner, Jeananne Rama, DO 12/12/20 1601    Drenda Freeze, MD 12/12/20 [phone]

## 2020-12-12 NOTE — ED Triage Notes (Signed)
Pt reports left lower back pain for the past 2 weeks, pt saw dr Alphonzo [place] office and received a pain injection 2 weeks ago but pain came back. States he woke up this morning and was unable to walk. Pt ambulatory to triage.

## 2020-12-14 ENCOUNTER — Encounter: Payer: Self-pay | Admitting: Dermatology

## 2020-12-14 NOTE — Progress Notes (Signed)
   Follow-Up Visit   Subjective  Brett [name] is a 75 y.o. male who presents for the following: Follow-up (3 month f/u right temple- healing good no concerns).  Follow-up skin cancer right temple, new spot left temple Location:  Duration:  Quality:  Associated Signs/Symptoms: Modifying Factors:  Severity:  Timing: Context:   Objective  Well appearing patient in no apparent distress; mood and affect are within normal limits. Head - Anterior (Face) No sign recurrent carcinoma in situ.  No other suspicious lesions on head and neck.  Left Forehead Flattopped 4 mm brown textured papule    A focused examination was performed including head and neck. Relevant physical exam findings are noted in the Assessment and Plan.   Assessment & Plan    Personal history of skin cancer Head - Anterior (Face)  Annual skin examination.  Seborrheic keratosis Left Forehead  Leave if stable      I, Lavonna Monarch, MD, have reviewed all documentation for this visit.  The documentation on 12/14/20 for the exam, diagnosis, procedures, and orders are all accurate and complete.

## 2020-12-16 DIAGNOSIS — M47816 Spondylosis without myelopathy or radiculopathy, lumbar region: Secondary | ICD-10-CM | POA: Insufficient documentation

## 2020-12-16 DIAGNOSIS — Z6827 Body mass index (BMI) 27.0-27.9, adult: Secondary | ICD-10-CM | POA: Insufficient documentation

## 2021-01-01 ENCOUNTER — Other Ambulatory Visit: Payer: Self-pay | Admitting: Neurosurgery

## 2021-01-09 NOTE — Progress Notes (Signed)
Surgical Instructions    Your procedure is scheduled on Wednesday September 7.  Report to [facility] Main Entrance "A" at 6:30 A.M., then check in with the Admitting office.  Call this number if you have problems the morning of surgery:  [phone]   If you have any questions prior to your surgery date call [phone]: Open Monday-Friday 8am-4pm    Remember:  Do not eat after midnight the night before your surgery  You may drink clear liquids until 5:30am the morning of your surgery.   Clear liquids allowed are: Water, Non-Citrus Juices (without pulp), Carbonated Beverages, Clear Tea, Black Coffee ONLY (NO MILK, CREAM OR POWDERED CREAMER of any kind), and Gatorade    Take these medicines the morning of surgery with A SIP OF WATER :              Acetaminophen (TYLENOL) if needed              amLODipine (NORVASC)               colestipol (COLESTID)              dutasteride (AVODART)              fenofibrate (TRICOR)               oxyCODONE (OXY IR/ROXICODONE)               pantoprazole (PROTONIX)              tamsulosin (FLOMAX)    As of today, STOP taking any Aspirin (unless otherwise instructed by your surgeon) Aleve, Naproxen, Ibuprofen, Motrin, Advil, Goody's, BC's, all herbal medications, fish oil, and all vitamins.          Do not wear jewelry or makeup Do not wear lotions, powders, perfumes/colognes, or deodorant. Do not shave 48 hours prior to surgery.  Men may shave face and neck. Do not bring valuables to the hospital. DO Not wear nail polish, gel polish, artificial nails, or any other type of covering on  natural nails including finger and toenails. If patients have artificial nails, gel coating, etc. that need to be removed by a nail salon please have this removed prior to surgery or surgery may need to be canceled/delayed if the surgeon/ anesthesia feels like the patient is unable to be adequately monitored.             [facility] is not responsible for any  belongings or valuables.  Do NOT Smoke (Tobacco/Vaping) or drink Alcohol 24 hours prior to your procedure If you use a CPAP at night, you may bring all equipment for your overnight stay.   Contacts, glasses, dentures or bridgework may not be worn into surgery, please bring cases for these belongings   For patients admitted to the hospital, discharge time will be determined by your treatment team.   Patients discharged the day of surgery will not be allowed to drive home, and someone needs to stay with them for 24 hours.  ONLY 1 SUPPORT PERSON MAY BE PRESENT WHILE YOU ARE IN SURGERY. IF YOU ARE TO BE ADMITTED ONCE YOU ARE IN YOUR ROOM YOU WILL BE ALLOWED TWO (2) VISITORS.  Minor children may have two parents present. Special consideration for safety and communication needs will be reviewed on a case by case basis.  Special instructions:    Oral Hygiene is also important to reduce your risk of infection.  Remember - BRUSH YOUR TEETH THE MORNING  OF SURGERY WITH YOUR REGULAR TOOTHPASTE   [facility]- Preparing For Surgery  Before surgery, you can play an important role. Because skin is not sterile, your skin needs to be as free of germs as possible. You can reduce the number of germs on your skin by washing with CHG (chlorahexidine gluconate) Soap before surgery.  CHG is an antiseptic cleaner which kills germs and bonds with the skin to continue killing germs even after washing.     Please do not use if you have an allergy to CHG or antibacterial soaps. If your skin becomes reddened/irritated stop using the CHG.  Do not shave (including legs and underarms) for at least 48 hours prior to first CHG shower. It is OK to shave your face.  Please follow these instructions carefully.     Shower the NIGHT BEFORE SURGERY and the MORNING OF SURGERY with CHG Soap.   If you chose to wash your hair, wash your hair first as usual with your normal shampoo. After you shampoo, rinse your hair and body  thoroughly to remove the shampoo.  Then [employer] and genitals (private parts) with your normal soap and rinse thoroughly to remove soap.  After that Use CHG Soap as you would any other liquid soap. You can apply CHG directly to the skin and wash gently with a scrungie or a clean washcloth.   Apply the CHG Soap to your body ONLY FROM THE NECK DOWN.  Do not use on open wounds or open sores. Avoid contact with your eyes, ears, mouth and genitals (private parts). Wash Face and genitals (private parts)  with your normal soap.   Wash thoroughly, paying special attention to the area where your surgery will be performed.  Thoroughly rinse your body with warm water from the neck down.  DO NOT shower/wash with your normal soap after using and rinsing off the CHG Soap.  Pat yourself dry with a CLEAN TOWEL.  Wear CLEAN PAJAMAS to bed the night before surgery  Place CLEAN SHEETS on your bed the night before your surgery  DO NOT SLEEP WITH PETS.   Day of Surgery:  Take a shower with CHG soap. Wear Clean/Comfortable clothing the morning of surgery Do not apply any deodorants/lotions.   Remember to brush your teeth WITH YOUR REGULAR TOOTHPASTE.   Please read over the following fact sheets that you were given.

## 2021-01-12 ENCOUNTER — Encounter (HOSPITAL_COMMUNITY): Payer: Self-pay

## 2021-01-12 ENCOUNTER — Other Ambulatory Visit: Payer: Self-pay

## 2021-01-12 ENCOUNTER — Encounter (HOSPITAL_COMMUNITY)
Admission: RE | Admit: 2021-01-12 | Discharge: 2021-01-12 | Disposition: A | Discharge status: Home / Self Care | Payer: Medicare HMO | Source: Ambulatory Visit | Attending: Neurosurgery | Admitting: Neurosurgery

## 2021-01-12 DIAGNOSIS — Z01818 Encounter for other preprocedural examination: Secondary | ICD-10-CM | POA: Insufficient documentation

## 2021-01-12 LAB — CBC
HCT: 41.9 % (ref 39.0–52.0)
Hemoglobin: 13.2 g/dL (ref 13.0–17.0)
MCH: 27.5 pg (ref 26.0–34.0)
MCHC: 31.5 g/dL (ref 30.0–36.0)
MCV: 87.3 fL (ref 80.0–100.0)
Platelets: 307 10*3/uL (ref 150–400)
RBC: 4.8 MIL/uL (ref 4.22–5.81)
RDW: 13.8 % (ref 11.5–15.5)
WBC: 7 10*3/uL (ref 4.0–10.5)
nRBC: 0 % (ref 0.0–0.2)

## 2021-01-12 LAB — TYPE AND SCREEN
ABO/RH(D): A POS
Antibody Screen: NEGATIVE

## 2021-01-12 LAB — SURGICAL PCR SCREEN
MRSA, PCR: NEGATIVE
Staphylococcus aureus: NEGATIVE

## 2021-01-12 LAB — BASIC METABOLIC PANEL
Anion gap: 5 (ref 5–15)
BUN: 10 mg/dL (ref 8–23)
CO2: 27 mmol/L (ref 22–32)
Calcium: 8.7 mg/dL — ABNORMAL LOW (ref 8.9–10.3)
Chloride: 104 mmol/L (ref 98–111)
Creatinine, Ser: 0.92 mg/dL (ref 0.61–1.24)
GFR, Estimated: >60 mL/min (ref >60)
Glucose, Bld: 96 mg/dL (ref 70–99)
Potassium: 4.1 mmol/L (ref 3.5–5.1)
Sodium: 136 mmol/L (ref 135–145)

## 2021-01-12 NOTE — Progress Notes (Addendum)
PCP: Noemi Chapel,  NP Cardiologist: denies  EKG: 01/12/21 CXR: na ECHO: denies Stress Test: >15 yeas ago.  Normal per patient Cardiac Cath: denies  Fasting Blood Sugar- na Checks Blood Sugar__na_ times a day  OSA/CPAP: Yes, wears nightly  ASA/Blood Thinner: No  Covid test 01/20/21.  Patient scheduled to come to PAT for covid test at 0800.  Anesthesia Review: No  Patient denies shortness of breath, fever, cough, and chest pain at PAT appointment.  Patient verbalized understanding of instructions provided today at the PAT appointment.  Patient asked to review instructions at home and day of surgery.

## 2021-01-20 ENCOUNTER — Other Ambulatory Visit (HOSPITAL_COMMUNITY)
Admission: RE | Admit: 2021-01-20 | Discharge: 2021-01-20 | Disposition: A | Discharge status: Home / Self Care | Payer: Medicare HMO | Source: Ambulatory Visit | Attending: Neurosurgery | Admitting: Neurosurgery

## 2021-01-20 ENCOUNTER — Other Ambulatory Visit: Payer: Self-pay

## 2021-01-20 DIAGNOSIS — Z01812 Encounter for preprocedural laboratory examination: Secondary | ICD-10-CM | POA: Diagnosis present

## 2021-01-20 DIAGNOSIS — Z20822 Contact with and (suspected) exposure to covid-19: Secondary | ICD-10-CM | POA: Diagnosis not present

## 2021-01-20 LAB — SARS CORONAVIRUS 2 (TAT 6-24 HRS): SARS Coronavirus 2: NEGATIVE

## 2021-01-21 ENCOUNTER — Observation Stay (HOSPITAL_COMMUNITY)
Admission: RE | Admit: 2021-01-21 | Discharge: 2021-01-23 | Disposition: A | Discharge status: Home / Self Care | Payer: Medicare HMO | Attending: Neurosurgery | Admitting: Neurosurgery

## 2021-01-21 ENCOUNTER — Ambulatory Visit (HOSPITAL_COMMUNITY): Payer: Medicare HMO | Admitting: Certified Registered"

## 2021-01-21 ENCOUNTER — Other Ambulatory Visit: Payer: Self-pay

## 2021-01-21 ENCOUNTER — Ambulatory Visit (HOSPITAL_COMMUNITY): Payer: Medicare HMO

## 2021-01-21 ENCOUNTER — Encounter (HOSPITAL_COMMUNITY)
Admission: RE | Disposition: A | Discharge status: Home / Self Care | Payer: Self-pay | Source: Home / Self Care | Attending: Neurosurgery

## 2021-01-21 DIAGNOSIS — Z79899 Other long term (current) drug therapy: Secondary | ICD-10-CM | POA: Insufficient documentation

## 2021-01-21 DIAGNOSIS — M4316 Spondylolisthesis, lumbar region: Secondary | ICD-10-CM | POA: Diagnosis not present

## 2021-01-21 DIAGNOSIS — Z87891 Personal history of nicotine dependence: Secondary | ICD-10-CM | POA: Insufficient documentation

## 2021-01-21 DIAGNOSIS — Z96652 Presence of left artificial knee joint: Secondary | ICD-10-CM | POA: Diagnosis not present

## 2021-01-21 DIAGNOSIS — M48062 Spinal stenosis, lumbar region with neurogenic claudication: Principal | ICD-10-CM | POA: Insufficient documentation

## 2021-01-21 DIAGNOSIS — Z85828 Personal history of other malignant neoplasm of skin: Secondary | ICD-10-CM | POA: Diagnosis not present

## 2021-01-21 DIAGNOSIS — Z96611 Presence of right artificial shoulder joint: Secondary | ICD-10-CM | POA: Insufficient documentation

## 2021-01-21 DIAGNOSIS — Z419 Encounter for procedure for purposes other than remedying health state, unspecified: Secondary | ICD-10-CM

## 2021-01-21 DIAGNOSIS — Z96641 Presence of right artificial hip joint: Secondary | ICD-10-CM | POA: Diagnosis not present

## 2021-01-21 DIAGNOSIS — I10 Essential (primary) hypertension: Secondary | ICD-10-CM | POA: Insufficient documentation

## 2021-01-21 DIAGNOSIS — M4696 Unspecified inflammatory spondylopathy, lumbar region: Secondary | ICD-10-CM | POA: Insufficient documentation

## 2021-01-21 SURGERY — POSTERIOR LUMBAR FUSION 1 LEVEL
Anesthesia: General

## 2021-01-21 MED ORDER — PHENYLEPHRINE HCL-NACL 20-0.9 MG/250ML-% IV SOLN
INTRAVENOUS | Status: DC | PRN
  Administered 2021-01-21: 50 ug/min via INTRAVENOUS

## 2021-01-21 MED ORDER — DEXMEDETOMIDINE (PRECEDEX) IN NS 20 MCG/5ML (4 MCG/ML) IV SYRINGE
PREFILLED_SYRINGE | INTRAVENOUS | Status: AC
  Filled 2021-01-21: qty 5

## 2021-01-21 MED ORDER — ONDANSETRON HCL 4 MG/2ML IJ SOLN
INTRAMUSCULAR | Status: AC
  Filled 2021-01-21: qty 2

## 2021-01-21 MED ORDER — TAMSULOSIN HCL 0.4 MG PO CAPS
0.4000 mg | ORAL_CAPSULE | Freq: Two times a day (BID) | ORAL | Status: DC
  Administered 2021-01-21 – 2021-01-22 (×2): 0.4 mg via ORAL
  Filled 2021-01-21 (×2): qty 1

## 2021-01-21 MED ORDER — CHLORHEXIDINE GLUCONATE 0.12 % MT SOLN
15.0000 mL | Freq: Once | OROMUCOSAL | Status: AC
  Administered 2021-01-21: 15 mL via OROMUCOSAL
  Filled 2021-01-21: qty 15

## 2021-01-21 MED ORDER — OXYCODONE HCL 5 MG PO TABS: 5.0000 mg | ORAL_TABLET | Freq: Once | ORAL | Status: DC | PRN

## 2021-01-21 MED ORDER — ONDANSETRON HCL 4 MG/2ML IJ SOLN
INTRAMUSCULAR | Status: DC | PRN
  Administered 2021-01-21: 4 mg via INTRAVENOUS

## 2021-01-21 MED ORDER — EPHEDRINE SULFATE-NACL 50-0.9 MG/10ML-% IV SOSY
PREFILLED_SYRINGE | INTRAVENOUS | Status: DC | PRN
  Administered 2021-01-21 (×2): 10 mg via INTRAVENOUS
  Administered 2021-01-21: 5 mg via INTRAVENOUS
  Administered 2021-01-21: 10 mg via INTRAVENOUS
  Administered 2021-01-21: 15 mg via INTRAVENOUS

## 2021-01-21 MED ORDER — MENTHOL 3 MG MT LOZG: 1.0000 | LOZENGE | OROMUCOSAL | Status: DC | PRN

## 2021-01-21 MED ORDER — ROCURONIUM BROMIDE 10 MG/ML (PF) SYRINGE
PREFILLED_SYRINGE | INTRAVENOUS | Status: AC
  Filled 2021-01-21: qty 10

## 2021-01-21 MED ORDER — OXYCODONE HCL ER 10 MG PO T12A: 10.0000 mg | EXTENDED_RELEASE_TABLET | Freq: Two times a day (BID) | ORAL | Status: DC

## 2021-01-21 MED ORDER — ACETAMINOPHEN 650 MG RE SUPP: 650.0000 mg | RECTAL | Status: DC | PRN

## 2021-01-21 MED ORDER — BUPIVACAINE HCL (PF) 0.5 % IJ SOLN
INTRAMUSCULAR | Status: DC | PRN
  Administered 2021-01-21: 30 mL

## 2021-01-21 MED ORDER — PANTOPRAZOLE SODIUM 40 MG PO TBEC
40.0000 mg | DELAYED_RELEASE_TABLET | Freq: Two times a day (BID) | ORAL | Status: DC
  Administered 2021-01-21 – 2021-01-22 (×2): 40 mg via ORAL
  Filled 2021-01-21 (×2): qty 1

## 2021-01-21 MED ORDER — PROMETHAZINE HCL 25 MG/ML IJ SOLN
INTRAMUSCULAR | Status: AC
  Filled 2021-01-21: qty 1

## 2021-01-21 MED ORDER — ACETAMINOPHEN 500 MG PO TABS: 1000.0000 mg | ORAL_TABLET | Freq: Once | ORAL | Status: DC | PRN

## 2021-01-21 MED ORDER — SODIUM CHLORIDE 0.9 % IV SOLN: 250.0000 mL | INTRAVENOUS | Status: DC

## 2021-01-21 MED ORDER — ACETAMINOPHEN 325 MG PO TABS
650.0000 mg | ORAL_TABLET | ORAL | Status: DC | PRN
  Administered 2021-01-22: 650 mg via ORAL
  Filled 2021-01-21: qty 2

## 2021-01-21 MED ORDER — ACETAMINOPHEN 160 MG/5ML PO SOLN: 1000.0000 mg | Freq: Once | ORAL | Status: DC | PRN

## 2021-01-21 MED ORDER — FENTANYL CITRATE (PF) 250 MCG/5ML IJ SOLN
INTRAMUSCULAR | Status: DC | PRN
  Administered 2021-01-21 (×3): 50 ug via INTRAVENOUS
  Administered 2021-01-21: 100 ug via INTRAVENOUS
  Administered 2021-01-21: 50 ug via INTRAVENOUS

## 2021-01-21 MED ORDER — ONDANSETRON HCL 4 MG PO TABS: 4.0000 mg | ORAL_TABLET | Freq: Four times a day (QID) | ORAL | Status: DC | PRN

## 2021-01-21 MED ORDER — ROCURONIUM BROMIDE 10 MG/ML (PF) SYRINGE
PREFILLED_SYRINGE | INTRAVENOUS | Status: DC | PRN
  Administered 2021-01-21 (×3): 50 mg via INTRAVENOUS

## 2021-01-21 MED ORDER — LOSARTAN POTASSIUM 50 MG PO TABS
25.0000 mg | ORAL_TABLET | Freq: Every day | ORAL | Status: DC
  Filled 2021-01-21: qty 1

## 2021-01-21 MED ORDER — SODIUM CHLORIDE 0.9% FLUSH: 3.0000 mL | INTRAVENOUS | Status: DC | PRN

## 2021-01-21 MED ORDER — CEFAZOLIN SODIUM-DEXTROSE 1-4 GM/50ML-% IV SOLN
1.0000 g | Freq: Three times a day (TID) | INTRAVENOUS | Status: AC
Start: 2021-01-21 — End: 2021-01-22
  Administered 2021-01-21 – 2021-01-22 (×2): 1 g via INTRAVENOUS
  Filled 2021-01-21 (×2): qty 50

## 2021-01-21 MED ORDER — ZOLPIDEM TARTRATE 5 MG PO TABS: 10.0000 mg | ORAL_TABLET | Freq: Every evening | ORAL | Status: DC | PRN

## 2021-01-21 MED ORDER — GABAPENTIN 300 MG PO CAPS
300.0000 mg | ORAL_CAPSULE | Freq: Three times a day (TID) | ORAL | Status: DC
  Administered 2021-01-21 – 2021-01-22 (×3): 300 mg via ORAL
  Filled 2021-01-21 (×3): qty 1

## 2021-01-21 MED ORDER — EPHEDRINE 5 MG/ML INJ
INTRAVENOUS | Status: AC
  Filled 2021-01-21: qty 5

## 2021-01-21 MED ORDER — MIDAZOLAM HCL 2 MG/2ML IJ SOLN
INTRAMUSCULAR | Status: DC | PRN
  Administered 2021-01-21: 2 mg via INTRAVENOUS

## 2021-01-21 MED ORDER — MORPHINE SULFATE (PF) 2 MG/ML IV SOLN: 2.0000 mg | INTRAVENOUS | Status: DC | PRN

## 2021-01-21 MED ORDER — PROMETHAZINE HCL 25 MG/ML IJ SOLN
6.2500 mg | Freq: Once | INTRAMUSCULAR | Status: AC
  Administered 2021-01-21: 6.25 mg via INTRAVENOUS

## 2021-01-21 MED ORDER — PROPOFOL 10 MG/ML IV BOLUS
INTRAVENOUS | Status: DC | PRN
  Administered 2021-01-21: 50 mg via INTRAVENOUS
  Administered 2021-01-21: 140 mg via INTRAVENOUS

## 2021-01-21 MED ORDER — ORAL CARE MOUTH RINSE: 15.0000 mL | Freq: Once | OROMUCOSAL | Status: AC

## 2021-01-21 MED ORDER — ONDANSETRON HCL 4 MG/2ML IJ SOLN: 4.0000 mg | Freq: Four times a day (QID) | INTRAMUSCULAR | Status: DC | PRN

## 2021-01-21 MED ORDER — DIAZEPAM 5 MG PO TABS: 5.0000 mg | ORAL_TABLET | Freq: Four times a day (QID) | ORAL | Status: DC | PRN

## 2021-01-21 MED ORDER — FENTANYL CITRATE (PF) 250 MCG/5ML IJ SOLN
INTRAMUSCULAR | Status: AC
  Filled 2021-01-21: qty 5

## 2021-01-21 MED ORDER — DEXAMETHASONE SODIUM PHOSPHATE 10 MG/ML IJ SOLN
INTRAMUSCULAR | Status: AC
  Filled 2021-01-21: qty 1

## 2021-01-21 MED ORDER — FENOFIBRATE 54 MG PO TABS
54.0000 mg | ORAL_TABLET | Freq: Every day | ORAL | Status: DC
  Administered 2021-01-22: 54 mg via ORAL
  Filled 2021-01-21 (×2): qty 1

## 2021-01-21 MED ORDER — KETOROLAC TROMETHAMINE 15 MG/ML IJ SOLN
7.5000 mg | Freq: Four times a day (QID) | INTRAMUSCULAR | Status: AC
  Administered 2021-01-21 – 2021-01-22 (×3): 7.5 mg via INTRAVENOUS
  Filled 2021-01-21 (×3): qty 1

## 2021-01-21 MED ORDER — SODIUM CHLORIDE 0.9% FLUSH: 3.0000 mL | Freq: Two times a day (BID) | INTRAVENOUS | Status: DC

## 2021-01-21 MED ORDER — FLEET ENEMA 7-19 GM/118ML RE ENEM: 1.0000 | ENEMA | Freq: Once | RECTAL | Status: DC | PRN

## 2021-01-21 MED ORDER — ALUM & MAG HYDROXIDE-SIMETH 200-200-20 MG/5ML PO SUSP: 30.0000 mL | Freq: Four times a day (QID) | ORAL | Status: DC | PRN

## 2021-01-21 MED ORDER — OXYCODONE HCL 5 MG/5ML PO SOLN
5.0000 mg | Freq: Once | ORAL | Status: DC | PRN
Start: 2021-01-21 — End: 2021-01-21

## 2021-01-21 MED ORDER — THROMBIN 20000 UNITS EX SOLR
CUTANEOUS | Status: DC | PRN
  Administered 2021-01-21: 20 mL via TOPICAL

## 2021-01-21 MED ORDER — BISACODYL 5 MG PO TBEC: 5.0000 mg | DELAYED_RELEASE_TABLET | Freq: Every day | ORAL | Status: DC | PRN

## 2021-01-21 MED ORDER — DEXAMETHASONE SODIUM PHOSPHATE 10 MG/ML IJ SOLN
INTRAMUSCULAR | Status: DC | PRN
  Administered 2021-01-21: 10 mg via INTRAVENOUS

## 2021-01-21 MED ORDER — BUPIVACAINE HCL (PF) 0.5 % IJ SOLN
INTRAMUSCULAR | Status: AC
  Filled 2021-01-21: qty 30

## 2021-01-21 MED ORDER — OXYCODONE HCL 5 MG PO TABS: 5.0000 mg | ORAL_TABLET | ORAL | Status: DC | PRN

## 2021-01-21 MED ORDER — FENTANYL CITRATE (PF) 100 MCG/2ML IJ SOLN: 25.0000 ug | INTRAMUSCULAR | Status: DC | PRN

## 2021-01-21 MED ORDER — OXYCODONE HCL 5 MG PO TABS: 10.0000 mg | ORAL_TABLET | ORAL | Status: DC | PRN

## 2021-01-21 MED ORDER — PROPOFOL 10 MG/ML IV BOLUS
INTRAVENOUS | Status: AC
  Filled 2021-01-21: qty 20

## 2021-01-21 MED ORDER — SUGAMMADEX SODIUM 200 MG/2ML IV SOLN
INTRAVENOUS | Status: DC | PRN
  Administered 2021-01-21: 200 mg via INTRAVENOUS

## 2021-01-21 MED ORDER — LACTATED RINGERS IV SOLN: INTRAVENOUS | Status: DC

## 2021-01-21 MED ORDER — LIDOCAINE 2% (20 MG/ML) 5 ML SYRINGE
INTRAMUSCULAR | Status: DC | PRN
Start: 2021-01-21 — End: 2021-01-21
  Administered 2021-01-21: 60 mg via INTRAVENOUS

## 2021-01-21 MED ORDER — SENNA 8.6 MG PO TABS
1.0000 | ORAL_TABLET | Freq: Two times a day (BID) | ORAL | Status: DC
  Administered 2021-01-21: 8.6 mg via ORAL
  Filled 2021-01-21 (×2): qty 1

## 2021-01-21 MED ORDER — PHENOL 1.4 % MT LIQD: 1.0000 | OROMUCOSAL | Status: DC | PRN

## 2021-01-21 MED ORDER — LIDOCAINE 2% (20 MG/ML) 5 ML SYRINGE
INTRAMUSCULAR | Status: AC
  Filled 2021-01-21: qty 5

## 2021-01-21 MED ORDER — THROMBIN 20000 UNITS EX SOLR
CUTANEOUS | Status: AC
  Filled 2021-01-21: qty 20000

## 2021-01-21 MED ORDER — ACETAMINOPHEN 10 MG/ML IV SOLN: 1000.0000 mg | Freq: Once | INTRAVENOUS | Status: DC | PRN

## 2021-01-21 MED ORDER — MIDAZOLAM HCL 2 MG/2ML IJ SOLN
INTRAMUSCULAR | Status: AC
  Filled 2021-01-21: qty 2

## 2021-01-21 MED ORDER — AMLODIPINE BESYLATE 5 MG PO TABS
10.0000 mg | ORAL_TABLET | Freq: Every day | ORAL | Status: DC
  Filled 2021-01-21 (×2): qty 2

## 2021-01-21 MED ORDER — SENNOSIDES-DOCUSATE SODIUM 8.6-50 MG PO TABS: 1.0000 | ORAL_TABLET | Freq: Every evening | ORAL | Status: DC | PRN

## 2021-01-21 MED ORDER — ZOLPIDEM TARTRATE 5 MG PO TABS: 5.0000 mg | ORAL_TABLET | Freq: Every evening | ORAL | Status: DC | PRN

## 2021-01-21 MED ORDER — CHLORHEXIDINE GLUCONATE CLOTH 2 % EX PADS: 6.0000 | MEDICATED_PAD | Freq: Once | CUTANEOUS | Status: DC

## 2021-01-21 MED ORDER — POTASSIUM CHLORIDE IN NACL 20-0.9 MEQ/L-% IV SOLN: INTRAVENOUS | Status: DC

## 2021-01-21 MED ORDER — LIDOCAINE-EPINEPHRINE (PF) 1 %-1:200000 IJ SOLN
INTRAMUSCULAR | Status: DC | PRN
  Administered 2021-01-21: 9 mL

## 2021-01-21 MED ORDER — CEFAZOLIN SODIUM-DEXTROSE 2-4 GM/100ML-% IV SOLN
2.0000 g | INTRAVENOUS | Status: AC
  Administered 2021-01-21: 2 g via INTRAVENOUS
  Filled 2021-01-21: qty 100

## 2021-01-21 MED ORDER — 0.9 % SODIUM CHLORIDE (POUR BTL) OPTIME
TOPICAL | Status: DC | PRN
  Administered 2021-01-21: 1000 mL

## 2021-01-21 SURGICAL SUPPLY — 69 items
ADH SKN CLS APL DERMABOND .7 (GAUZE/BANDAGES/DRESSINGS) ×1
APL SKNCLS STERI-STRIP NONHPOA (GAUZE/BANDAGES/DRESSINGS)
BAG COUNTER SPONGE SURGICOUNT (BAG) ×2 IMPLANT
BAG SPNG CNTER NS LX DISP (BAG) ×1
BASKET BONE COLLECTION (BASKET) ×1 IMPLANT
BENZOIN TINCTURE PRP APPL 2/3 (GAUZE/BANDAGES/DRESSINGS) IMPLANT
BLADE CLIPPER SURG (BLADE) IMPLANT
BUR MATCHSTICK NEURO 3.0 LAGG (BURR) ×2 IMPLANT
BUR PRECISION FLUTE 5.0 (BURR) ×2 IMPLANT
CAGE POR CASCADIA 8.5X28X10 (Cage) ×2 IMPLANT
CANISTER SUCT 3000ML PPV (MISCELLANEOUS) ×2 IMPLANT
CAP RELINE MOD TULIP RMM (Cap) ×4 IMPLANT
CARTRIDGE OIL MAESTRO DRILL (MISCELLANEOUS) ×1 IMPLANT
CNTNR URN SCR LID CUP LEK RST (MISCELLANEOUS) ×1 IMPLANT
CONT SPEC 4OZ STRL OR WHT (MISCELLANEOUS) ×2
COVER BACK TABLE 60X90IN (DRAPES) ×2 IMPLANT
DECANTER SPIKE VIAL GLASS SM (MISCELLANEOUS) ×2 IMPLANT
DERMABOND ADVANCED (GAUZE/BANDAGES/DRESSINGS) ×1
DERMABOND ADVANCED .7 DNX12 (GAUZE/BANDAGES/DRESSINGS) ×1 IMPLANT
DIFFUSER DRILL AIR PNEUMATIC (MISCELLANEOUS) ×2 IMPLANT
DRAPE C-ARM 42X72 X-RAY (DRAPES) ×3 IMPLANT
DRAPE C-ARMOR (DRAPES) ×1 IMPLANT
DRAPE LAPAROTOMY 100X72X124 (DRAPES) ×2 IMPLANT
DRAPE SURG 17X23 STRL (DRAPES) ×2 IMPLANT
DURAPREP 26ML APPLICATOR (WOUND CARE) ×2 IMPLANT
ELECT REM PT RETURN 9FT ADLT (ELECTROSURGICAL) ×2
ELECTRODE REM PT RTRN 9FT ADLT (ELECTROSURGICAL) ×1 IMPLANT
GAUZE 4X4 16PLY ~~LOC~~+RFID DBL (SPONGE) IMPLANT
GAUZE SPONGE 4X4 12PLY STRL (GAUZE/BANDAGES/DRESSINGS) IMPLANT
GLOVE EXAM NITRILE XL STR (GLOVE) IMPLANT
GLOVE SRG 8 PF TXTR STRL LF DI (GLOVE) IMPLANT
GLOVE SURG LTX SZ6.5 (GLOVE) ×4 IMPLANT
GLOVE SURG UNDER POLY LF SZ8 (GLOVE) ×6
GOWN STRL REUS W/ TWL LRG LVL3 (GOWN DISPOSABLE) ×2 IMPLANT
GOWN STRL REUS W/ TWL XL LVL3 (GOWN DISPOSABLE) IMPLANT
GOWN STRL REUS W/TWL 2XL LVL3 (GOWN DISPOSABLE) ×2 IMPLANT
GOWN STRL REUS W/TWL LRG LVL3 (GOWN DISPOSABLE) ×6
GOWN STRL REUS W/TWL XL LVL3 (GOWN DISPOSABLE)
KIT BASIN OR (CUSTOM PROCEDURE TRAY) ×2 IMPLANT
KIT POSITION SURG JACKSON T1 (MISCELLANEOUS) ×2 IMPLANT
KIT TURNOVER KIT B (KITS) ×2 IMPLANT
MARKER SKIN DUAL TIP RULER LAB (MISCELLANEOUS) ×1 IMPLANT
MILL MEDIUM DISP (BLADE) ×1 IMPLANT
NDL HYPO 25X1 1.5 SAFETY (NEEDLE) ×1 IMPLANT
NDL SPNL 18GX3.5 QUINCKE PK (NEEDLE) IMPLANT
NEEDLE HYPO 25X1 1.5 SAFETY (NEEDLE) ×2 IMPLANT
NEEDLE SPNL 18GX3.5 QUINCKE PK (NEEDLE) IMPLANT
NS IRRIG 1000ML POUR BTL (IV SOLUTION) ×2 IMPLANT
OIL CARTRIDGE MAESTRO DRILL (MISCELLANEOUS) ×2
PACK LAMINECTOMY NEURO (CUSTOM PROCEDURE TRAY) ×2 IMPLANT
PAD ARMBOARD 7.5X6 YLW CONV (MISCELLANEOUS) ×4 IMPLANT
ROD RELINE COCR LORD 5.0X35 (Rod) ×1 IMPLANT
ROD RELINE COCR LORD 5X30 (Rod) ×1 IMPLANT
SCREW LOCK RSS 4.5/5.0MM (Screw) ×4 IMPLANT
SCREW SHANK RELINE MOD 5.5X35 (Screw) ×1 IMPLANT
SCREW SHANK RELINE MOD 6.5X35 (Screw) ×3 IMPLANT
SPONGE SURGIFOAM ABS GEL 100 (HEMOSTASIS) ×2 IMPLANT
SPONGE T-LAP 4X18 ~~LOC~~+RFID (SPONGE) IMPLANT
STRIP CLOSURE SKIN 1/2X4 (GAUZE/BANDAGES/DRESSINGS) IMPLANT
SUT PROLENE 6 0 BV (SUTURE) IMPLANT
SUT VIC AB 0 CT1 18XCR BRD8 (SUTURE) ×1 IMPLANT
SUT VIC AB 0 CT1 8-18 (SUTURE) ×4
SUT VIC AB 2-0 CT1 18 (SUTURE) ×2 IMPLANT
SUT VIC AB 3-0 SH 8-18 (SUTURE) ×3 IMPLANT
SYR 20ML LL LF (SYRINGE) ×1 IMPLANT
TOWEL GREEN STERILE (TOWEL DISPOSABLE) ×2 IMPLANT
TOWEL GREEN STERILE FF (TOWEL DISPOSABLE) ×2 IMPLANT
TRAY FOLEY MTR SLVR 16FR STAT (SET/KITS/TRAYS/PACK) ×2 IMPLANT
WATER STERILE IRR 1000ML POUR (IV SOLUTION) ×2 IMPLANT

## 2021-01-21 NOTE — H&P (Signed)
BP (!) 157/60   Pulse 60   Temp 97.6 F (36.4 C) (Oral)   Resp 17   Ht '5\' 8"'$  (1.727 m)   Wt 81.6 kg   SpO2 100%   BMI 27.37 kg/m  Brett [name] returns today.  He has had the injections in both the neck and lower back.  The cervical injections seem to have done enough to relieve him of most of his pain, but they are of much less a concern than is the lower back.  He has received injections at L4-5 in the lower back and they help, but the effects wear off fairly quickly after about 30 days or so.  He is still not hurting enough that he would pursue any kind of operative intervention.  He has a daughter, I believe, who wants him to look into stem-cell treatment.  He is going to speak with a physician in the [place] area.  They gave him his myelogram on disc and gave him the report on paper.  He weighs 187 pounds, temperature is 97.7, blood pressure 151/66, pulse 66, pain is 2/10.  He is alert, oriented by 4, answers all questions appropriately. 5/5 strength.  He has normal muscle tone and bulk.  Looking again at the myelogram, at 4-5 he has severe facet arthropathy, certainly has some stenosis present at that level.  If anything that would be done, it would be there, but for right now we will continue with the injections. Allergies  Allergen Reactions   Other     RSD   Statins     myalgias   Tramadol Nausea Only   Past Medical History:  Diagnosis Date   Allergy    seasonal   Anemia 06/2019   iron deficiency   Arthritis    Cataract    Diverticulosis    GERD (gastroesophageal reflux disease)    Hiatal hernia 2014   Dr. Lyda Per[facility])   History of hiatal hernia    Hyperlipidemia    Hypertension    IBS (irritable colon syndrome)    Pneumonia    hx. of   PONV (postoperative nausea and vomiting)    with gallbladder surgery   SCC (squamous cell carcinoma) 01/02/2019   R temple-txpbx   Sleep apnea    cpap   Vertigo    Past Surgical History:  Procedure Laterality Date   CARPAL  TUNNEL RELEASE Bilateral    CHOLECYSTECTOMY  2004   Post-cholecystectomy syndrome    COLONOSCOPY     ESOPHAGOGASTRODUODENOSCOPY     EYE SURGERY     cataract   FRACTURE SURGERY  2006   fx. wrist   JOINT REPLACEMENT     KNEE SURGERY Left 2020   rhino plasty     deviated septum   SHOULDER SURGERY  2011   rotator cuff   TOTAL HIP ARTHROPLASTY Right 12/13/2016   Procedure: RIGHT TOTAL HIP ARTHROPLASTY ANTERIOR APPROACH;  Surgeon: Rod Can, MD;  Location: [place];  Service: Orthopedics;  Laterality: Right;   TOTAL SHOULDER ARTHROPLASTY Right 04/15/2017   Procedure: RIGHT TOTAL SHOULDER ARTHROPLASTY;  Surgeon: Netta Cedars, MD;  Location: [place];  Service: Orthopedics;  Laterality: Right;   TYMPANOSTOMY TUBE PLACEMENT Left 2020   UPPER GASTROINTESTINAL ENDOSCOPY     VASECTOMY  1980   Family History  Problem Relation Age of Onset   Cancer Sister        uterine    Cancer Maternal Grandmother    Colon cancer Neg Hx  Colon polyps Neg Hx    Esophageal cancer Neg Hx    Rectal cancer Neg Hx    Stomach cancer Neg Hx    Social History   Socioeconomic History   Marital status: Married    Spouse name: Not on file   Number of children: Not on file   Years of education: Not on file   Highest education level: Not on file  Occupational History   Not on file  Tobacco Use   Smoking status: Former    Packs/day: 2.00    Years: 51.00    Pack years: 102.00    Types: Cigarettes, Cigars    Start date: 1963    Quit date: 2012    Years since quitting: 10.6   Smokeless tobacco: Never  Vaping Use   Vaping Use: Never used  Substance and Sexual Activity   Alcohol use: No   Drug use: No   Sexual activity: Yes  Other Topics Concern   Not on file  Social History Narrative   Not on file   Social Determinants of Health   Financial Resource Strain: Not on file  Food Insecurity: Not on file  Transportation Needs: Not on file  Physical Activity: Not on file  Stress: Not on file   Social Connections: Not on file  Intimate Partner Violence: Not on file   Prior to Admission medications   Medication Sig Start Date End Date Taking? Authorizing Provider  acetaminophen (TYLENOL) 325 MG tablet Take 650 mg by mouth every 6 (six) hours as needed for moderate pain.   Yes [provider]  amLODipine (NORVASC) 10 MG tablet TAKE 1 TABLET DAILY 09/19/20  Yes Noemi Chapel A, NP  colestipol (COLESTID) 1 g tablet TAKE 1 TABLET TWICE A DAY 06/09/20  Yes [place], Modena Nunnery, MD  fenofibrate (TRICOR) 48 MG tablet TAKE 1 TABLET DAILY 07/08/20  Yes [place], Modena Nunnery, MD  losartan (COZAAR) 25 MG tablet TAKE 1 TABLET DAILY 03/31/20  Yes [facility], Modena Nunnery, MD  oxyCODONE (OXY IR/ROXICODONE) 5 MG immediate release tablet Take 5 mg by mouth every 6 (six) hours as needed for pain. 12/16/20  Yes [provider]  pantoprazole (PROTONIX) 40 MG tablet Take 1 tablet (40 mg total) by mouth 2 (two) times daily. 04/28/20  Yes [place], Modena Nunnery, MD  tamsulosin (FLOMAX) 0.4 MG CAPS capsule Take 2 capsules (0.8 mg total) by mouth daily. Patient taking differently: Take 0.4 mg by mouth in the morning and at bedtime. 01/14/20  Yes [place], Modena Nunnery, MD  zolpidem (AMBIEN) 10 MG tablet Take 1 tablet (10 mg total) by mouth at bedtime as needed for sleep. 07/29/20 01/06/21 Yes Eulogio Bear, NP  diclofenac (VOLTAREN) 75 MG EC tablet Take 1 tablet (75 mg total) by mouth 2 (two) times daily. Patient not taking: No sig reported 08/21/20   Eulogio Bear, NP  diphenoxylate-atropine (LOMOTIL) 2.5-0.025 MG tablet Take 2 tablets by mouth 4 (four) times daily as needed for diarrhea or loose stools. Patient not taking: Reported on 01/06/2021 10/09/20   Susy Frizzle, MD  dutasteride (AVODART) 0.5 MG capsule Take 1 capsule (0.5 mg total) by mouth daily. Patient not taking: No sig reported 07/29/20   Eulogio Bear, NP  methylPREDNISolone (MEDROL DOSEPAK) 4 MG TBPK tablet Use as directed Patient  not taking: No sig reported 12/12/20   Dorethea Clan, DO    We did go over the myelogram from January and we looked at the findings at 4-5  where he has significant and severe facet arthropathy and degeneration and he is stenotic.  I explained that I would recommend a fusion.

## 2021-01-21 NOTE — Progress Notes (Signed)
Placed patient on CPAP via nasal mask, 9.0 cm H2O per patient home settings. 21%, RN aware. Tolerating well at this time.

## 2021-01-21 NOTE — Transfer of Care (Signed)
Immediate Anesthesia Transfer of Care Note  [name]  Procedure(s) Performed: Lumbar Four-Five Posterior Lumbar Interbody Fusion  Patient Location: PACU  Anesthesia Type:General  Level of Consciousness: drowsy and patient cooperative  Airway & Oxygen Therapy: Patient Spontanous Breathing and Patient connected to face mask oxygen  Post-op Assessment: Report given to RN and Post -op Vital signs reviewed and stable  Post vital signs: Reviewed and stable  Last Vitals:  Vitals Value Taken Time  BP    Temp    Pulse    Resp    SpO2      Last Pain:  Vitals:   01/21/21 0726  TempSrc:   PainSc: 2          Complications: No notable events documented.

## 2021-01-21 NOTE — Anesthesia Preprocedure Evaluation (Signed)
Anesthesia Evaluation  Patient identified by MRN, date of birth, ID band Patient awake    Reviewed: Allergy & Precautions, NPO status , Patient's Chart, lab work & pertinent test results  History of Anesthesia Complications (+) PONV and history of anesthetic complications  Airway Mallampati: III  TM Distance: <3 FB Neck ROM: Full    Dental  (+) Dental Advisory Given, Teeth Intact   Pulmonary neg shortness of breath, sleep apnea and Continuous Positive Airway Pressure Ventilation , neg COPD, neg recent URI, former smoker,    breath sounds clear to auscultation       Cardiovascular hypertension, Pt. on medications (-) angina(-) Past MI and (-) CHF (-) dysrhythmias  Rhythm:Regular     Neuro/Psych neg Seizures  Neuromuscular disease negative psych ROS   GI/Hepatic Neg liver ROS, hiatal hernia, GERD  Medicated and Controlled,  Endo/Other  negative endocrine ROS  Renal/GU negative Renal ROS     Musculoskeletal  (+) Arthritis ,   Abdominal   Peds  Hematology negative hematology ROS (+) Lab Results      Component                Value               Date                      WBC                      7.0                 01/12/2021                HGB                      13.2                01/12/2021                HCT                      41.9                01/12/2021                MCV                      87.3                01/12/2021                PLT                      307                 01/12/2021              Anesthesia Other Findings   Reproductive/Obstetrics                             Anesthesia Physical Anesthesia Plan  ASA: 2  Anesthesia Plan: General   Post-op Pain Management:    Induction: Intravenous  PONV Risk Score and Plan: 3 and Ondansetron and Dexamethasone  Airway Management Planned: Oral ETT  Additional Equipment: None  Intra-op Plan:   Post-operative Plan:  Extubation in OR  Informed Consent: I have  reviewed the patients History and Physical, chart, labs and discussed the procedure including the risks, benefits and alternatives for the proposed anesthesia with the patient or authorized representative who has indicated his/her understanding and acceptance.     Dental advisory given  Plan Discussed with: CRNA and Anesthesiologist  Anesthesia Plan Comments:         Anesthesia Quick Evaluation

## 2021-01-21 NOTE — Anesthesia Procedure Notes (Signed)
Procedure Name: Intubation Date/Time: 01/21/2021 9:12 AM Performed by: Lance Coon, CRNA Pre-anesthesia Checklist: Emergency Drugs available, Patient identified, Suction available, Patient being monitored and Timeout performed Patient Re-evaluated:Patient Re-evaluated prior to induction Oxygen Delivery Method: Circle system utilized Preoxygenation: Pre-oxygenation with 100% oxygen Induction Type: IV induction Ventilation: Mask ventilation without difficulty Laryngoscope Size: Miller and 3 Grade View: Grade II Tube type: Oral Tube size: 7.5 mm Number of attempts: 1 Airway Equipment and Method: Stylet Placement Confirmation: ETT inserted through vocal cords under direct vision, positive ETCO2 and breath sounds checked- equal and bilateral Secured at: 22 cm Tube secured with: Tape Dental Injury: Teeth and Oropharynx as per pre-operative assessment

## 2021-01-22 DIAGNOSIS — M48062 Spinal stenosis, lumbar region with neurogenic claudication: Secondary | ICD-10-CM | POA: Diagnosis not present

## 2021-01-22 NOTE — Op Note (Signed)
01/21/2021  8:00 PM  PATIENT:  Brett [name]  75 y.o. male With lumbar spondylolistheis, stenosis and neurogenic claudication at L4/5 PRE-OPERATIVE DIAGNOSIS:  Osteoarthritis of facet joint of lumbar spine Lumbar spondylolisthesis L4/5 POST-OPERATIVE DIAGNOSIS:  Osteoarthritis of facet joint of lumbar spine Lumbar spondylolisthesis L4/5 PROCEDURE:  Procedure(s): Lumbar Four-Five Posterior Lumbar Interbody Fusion, (Stryker) Cascadia Titanium cages filled with autograft morsels Laminectomy L4 in excess of needed exposure for a PLIF Non segmental pedicle screw fixation L4/5, Nuvasive relign  SURGEON:  Surgeon(s): Ashok Pall, MD  ASSISTANTS:none  ANESTHESIA:   general  EBL:  No intake/output data recorded.  BLOOD ADMINISTERED:none  CELL SAVER GIVEN:none  COUNT:per nursing  DRAINS: none   SPECIMEN:  No Specimen  DICTATION: Brett [name] is a 75 y.o. male whom was taken to the operating room intubated, and placed under a general anesthetic without difficulty. A foley catheter was placed under sterile conditions. He was positioned prone on a Jackson table with all pressure points properly padded.  His lumbar region was prepped and draped in a sterile manner. I infiltrated 10 cc's 1/2%lidocaine/1:2000,000 strength epinephrine into the planned incision. I opened the skin with a 10 blade and took the incision down to the thoracolumbar fascia. I exposed the lamina of L3,4,and 5 in a subperiosteal fashion bilaterally. I confirmed my location with an intraoperative xray.  I placed self retaining retractors and started the decompression.  I decompressed the spinal canal  via a complete L4 laminectomy, L4 inferior facetectomies, and partial L5 superior facetectomies. This allowed for a complete decompression of the spinal canal, the lateral recesses, and the neural foramina bilaterally. This exposure was well beyond the needed access to the L4/5 disc space.  PLIF's were performed at L4/5 in  the same fashion. I opened the disc space with a 15 blade then used a variety of instruments to remove the disc and prepare the space for the arthrodesis. I used curettes, rongeurs, punches, shavers for the disc space, and rasps in the discetomy. I measured the disc space and placed 30m titanium cages(Cascadia Stryker) packed with autograft morsels into the disc space(s).   I placed pedicle screws at L4, and 5, using fluoroscopic guidance. I drilled a pilot hole, then cannulated the pedicle with a drill at each site. I then tapped each pedicle, assessing each site for pedicle violations. No cutouts were appreciated. Screws (Nuvasive relign) were then placed at each site without difficulty. I attached rods and locking caps with the appropriate tools. The locking caps were secured with torque limited screwdrivers. Final films were performed and the final construct appeared to be in good position.  I closed the wound in a layered fashion. I approximated the thoracolumbar fascia, subcutaneous, and subcuticular planes with vicryl sutures. I used dermabond, and an occlusive bandage for a sterile dressing.     PLAN OF CARE: Admit to inpatient   PATIENT DISPOSITION:  PACU - hemodynamically stable.   Delay start of Pharmacological VTE agent (>24hrs) due to surgical blood loss or risk of bleeding:  yes

## 2021-01-22 NOTE — Evaluation (Signed)
Occupational Therapy Evaluation Patient Details Name: Brett [name] MRN: [id number] DOB: 1945-06-10 Today's Date: 01/22/2021    History of Present Illness 75 y.o. male presenting for elective L4-5 decompression and fusion. PMHx significant for OA, cataracts s/p removal, HLD, HTN, SCC, OSA, cholecystectomy, R THA 2018 and R rotator cuff repair 2011.   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 Patient currently presents at baseline demonstrating LB dressing, grooming standing at sink level, function mobility up to 246ft and stair negotiation 1x flight (ascending/descending) with I. OT provided education on spinal precautions, home set-up to maximize safety and independence with self-care tasks, and acquisition/use of AE. Patient expressed verbal understanding. Patient does not require continued acute occupational therapy services with OT to sign off at this time.      Follow Up Recommendations  No OT follow up    Equipment Recommendations  None recommended by OT    Recommendations for Other Services       Precautions / Restrictions Precautions Precautions: Back Precaution Booklet Issued: Yes (comment) Precaution Comments: Written and verbal education provided. Able to recall 3/3 back precautions without cueing. Good reutrn demo with ADLs. Required Braces or Orthoses:  (No brace needed) Restrictions Weight Bearing Restrictions: No      Mobility Bed Mobility Overal bed mobility: Independent                  Transfers Overall transfer level: Independent                    Balance Overall balance assessment: Independent                                         ADL either performed or assessed with clinical judgement   ADL Overall ADL's : Independent                                             Vision   Vision  Assessment?: No apparent visual deficits     Perception     Praxis      Pertinent Vitals/Pain Pain Assessment: 0-10 Pain Score: 2  Pain Location: Low back (incision) Pain Descriptors / Indicators: Sore Pain Intervention(s): Premedicated before session;Monitored during session     Hand Dominance     Extremity/Trunk Assessment Upper Extremity Assessment Upper Extremity Assessment: Overall WFL for tasks assessed   Lower Extremity Assessment Lower Extremity Assessment: Overall WFL for tasks assessed   Cervical / Trunk Assessment Cervical / Trunk Assessment: Normal   Communication Communication Communication: No difficulties   Cognition Arousal/Alertness: Awake/alert Behavior During Therapy: WFL for tasks assessed/performed Overall Cognitive Status: Within Functional Limits for tasks assessed                                     General Comments  Clean/dry dressing at incision.    Exercises     Shoulder Instructions      Home Living Family/patient expects to be discharged to:: Private residence Living Arrangements: Spouse/significant other Available Help at Discharge: Family Type of Home: House Home Access: Stairs to  enter Entrance Stairs-Number of Steps: 3 Entrance Stairs-Rails: Right (Ascending) Home Layout: One level     Bathroom Shower/Tub: Producer, television/film/video: Handicapped height     Home Equipment: Grab bars - tub/shower;Hand held Careers information officer - 2 wheels;Cane - single point          Prior Functioning/Environment Level of Independence: Independent        Comments: 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        OT Problem List:        OT Treatment/Interventions:      OT Goals(Current goals can be found in the care plan section) Acute Rehab OT Goals Patient Stated Goal: To return home. OT Goal Formulation: With patient  OT Frequency:     Barriers to D/C:            Co-evaluation               AM-PAC OT "6 Clicks" Daily Activity     Outcome Measure Help from another person eating meals?: None Help from another person taking care of personal grooming?: None Help from another person toileting, which includes using toliet, bedpan, or urinal?: None Help from another person bathing (including washing, rinsing, drying)?: None Help from another person to put on and taking off regular upper body clothing?: None Help from another person to put on and taking off regular lower body clothing?: None 6 Click Score: 24   End of Session Nurse Communication: Mobility status  Activity Tolerance: Patient tolerated treatment well Patient left: in bed;with call bell/phone within reach                   Time: 0753-0810 OT Time Calculation (min): 17 min Charges:  OT General Charges $OT Visit: 1 Visit OT Evaluation $OT Eval Low Complexity: 1 Low  Frederika Hukill H. OTR/L Supplemental OT, Department of rehab services [phone]  Montine Hight R H. 01/22/2021, 8:38 AM

## 2021-01-22 NOTE — Plan of Care (Signed)
Pt and wife given D/C instructions with verbal understanding. Pt's incision is clean and dry with no sign of infection. Pt's IV was removed prior to D/C. Pt D/C'd home via wheelchair per MD order. Pt is stable @ D/C and has no other needs at this time. Arun Herrod, RN  

## 2021-01-22 NOTE — Progress Notes (Addendum)
PT Cancellation Note and Discharge  Patient Details Name: Brett [name] MRN: [id number] DOB: 1945/11/12   Cancelled Treatment:    Reason Eval/Treat Not Completed: PT screened, no needs identified, will sign off. Discussed pt case with OT who reports pt independent with functional mobility and ADL's. OT completed stair training with pt. Will sign off at this time. If needs change, please reconsult.    Thelma Comp 01/22/2021, 9:55 AM  Rolinda Roan, PT, DPT Acute Rehabilitation Services Pager: [phone] Office: [phone]

## 2021-01-22 NOTE — Care Management Obs Status (Signed)
[facility] NOTIFICATION   Patient Details  Name: ELIGH MCINDOE MRN: [id number] Date of Birth: [birth year]   Medicare Observation Status Notification Given:  Yes    Angelita Ingles, RN 01/22/2021, 10:51 AM

## 2021-01-22 NOTE — Anesthesia Postprocedure Evaluation (Signed)
Anesthesia Post Note  Patient: DEYLIN ADLEY  Procedure(s) Performed: Lumbar Four-Five Posterior Lumbar Interbody Fusion     Patient location during evaluation: PACU Anesthesia Type: General Level of consciousness: awake and alert Pain management: pain level controlled Vital Signs Assessment: post-procedure vital signs reviewed and stable Respiratory status: spontaneous breathing, nonlabored ventilation, respiratory function stable and patient connected to nasal cannula oxygen Cardiovascular status: blood pressure returned to baseline and stable Postop Assessment: no apparent nausea or vomiting Anesthetic complications: no   No notable events documented.  Last Vitals:  Vitals:   01/22/21 0741 01/22/21 1157  BP: 139/62 135/66  Pulse: 68 (!) 59  Resp: 17 16  Temp: 36.7 C 36.9 C  SpO2: 94% 95%    Last Pain:  Vitals:   01/22/21 1157  TempSrc: Oral  PainSc:                  Montario Zilka

## 2021-01-22 NOTE — Care Management CC44 (Signed)
Condition Code 44 Documentation Completed  Patient Details  Name: Brett [name] MRN: [id number] Date of Birth: 1945-12-04   Condition Code 44 given:  Yes Patient signature on Condition Code 44 notice:  Yes Documentation of 2 MD's agreement:  Yes Code 44 added to claim:  Yes    Angelita Ingles, RN 01/22/2021, 10:51 AM

## 2021-01-23 NOTE — Discharge Summary (Signed)
Physician Discharge Summary  Patient ID: Brett [name] MRN: [id number] DOB/AGE: 1946-03-13 75 y.o.  Admit date: 01/21/2021 Discharge date: 01/23/2021  Admission Diagnoses:spondylisthesis L4/5 Spinal stenosis Neurogenic claudication   Discharge Diagnoses: same Active Problems:   Spondylolisthesis at L4-L5 level   Discharged Condition: good  Hospital Course: Mr. Lazer was admitted and taken to the operating room for an uncomplicated lumbar PLIF at L4/5. Post op he is voiding, ambulating, and tolerating a regular diet. His wound is clean, dry.   Treatments: surgery: Lumbar Four-Five Posterior Lumbar Interbody Fusion, (Stryker) Cascadia Titanium cages filled with autograft morsels Laminectomy L4 in excess of needed exposure for a PLIF Non segmental pedicle screw fixation L4/5, Nuvasive relign  Discharge Exam: Blood pressure 135/66, pulse (!) 59, temperature 98.5 F (36.9 C), temperature source Oral, resp. rate 16, height '5\' 8"'$  (1.727 m), weight 81.6 kg, SpO2 95 %. General appearance: alert, cooperative, and no distress  Disposition: Discharge disposition: 01-Home or Self Care      Osteoarthritis of facet joint of lumbar spine  Allergies as of 01/23/2021       Reactions   Other    RSD   Statins    myalgias   Tramadol Nausea Only        Medication List     TAKE these medications    acetaminophen 325 MG tablet Commonly known as: TYLENOL Take 650 mg by mouth every 6 (six) hours as needed for moderate pain.   amLODipine 10 MG tablet Commonly known as: NORVASC TAKE 1 TABLET DAILY   colestipol 1 g tablet Commonly known as: COLESTID TAKE 1 TABLET TWICE A DAY   diclofenac 75 MG EC tablet Commonly known as: VOLTAREN Take 1 tablet (75 mg total) by mouth 2 (two) times daily.   diphenoxylate-atropine 2.5-0.025 MG tablet Commonly known as: Lomotil Take 2 tablets by mouth 4 (four) times daily as needed for diarrhea or loose stools.   dutasteride 0.5 MG  capsule Commonly known as: AVODART Take 1 capsule (0.5 mg total) by mouth daily.   fenofibrate 48 MG tablet Commonly known as: TRICOR TAKE 1 TABLET DAILY   losartan 25 MG tablet Commonly known as: COZAAR TAKE 1 TABLET DAILY   methylPREDNISolone 4 MG Tbpk tablet Commonly known as: MEDROL DOSEPAK Use as directed   oxyCODONE 5 MG immediate release tablet Commonly known as: Oxy IR/ROXICODONE Take 5 mg by mouth every 6 (six) hours as needed for pain.   pantoprazole 40 MG tablet Commonly known as: PROTONIX Take 1 tablet (40 mg total) by mouth 2 (two) times daily.   tamsulosin 0.4 MG Caps capsule Commonly known as: FLOMAX Take 2 capsules (0.8 mg total) by mouth daily. What changed:  how much to take when to take this   zolpidem 10 MG tablet Commonly known as: AMBIEN Take 1 tablet (10 mg total) by mouth at bedtime as needed for sleep.        Follow-up Information     Ashok Pall, MD Follow up.   Specialty: Neurosurgery Why: please call the office to make an appointment in 3 weeks Contact information: [address] [place] [address] [phone]                 Signed: Ashok Pall 01/23/2021, 3:13 PM

## 2021-01-23 NOTE — Discharge Instructions (Signed)

## 2021-01-29 ENCOUNTER — Encounter: Payer: Self-pay | Admitting: Nurse Practitioner

## 2021-01-29 ENCOUNTER — Other Ambulatory Visit: Payer: Self-pay

## 2021-01-29 ENCOUNTER — Ambulatory Visit (INDEPENDENT_AMBULATORY_CARE_PROVIDER_SITE_OTHER): Payer: Medicare HMO | Admitting: Nurse Practitioner

## 2021-01-29 VITALS — BP 114/72 | HR 69 | Ht 68.0 in | Wt 180.6 lb

## 2021-01-29 DIAGNOSIS — F5102 Adjustment insomnia: Secondary | ICD-10-CM

## 2021-01-29 DIAGNOSIS — I10 Essential (primary) hypertension: Secondary | ICD-10-CM

## 2021-01-29 DIAGNOSIS — Z23 Encounter for immunization: Secondary | ICD-10-CM

## 2021-01-29 DIAGNOSIS — E782 Mixed hyperlipidemia: Secondary | ICD-10-CM

## 2021-01-29 DIAGNOSIS — R3914 Feeling of incomplete bladder emptying: Secondary | ICD-10-CM

## 2021-01-29 DIAGNOSIS — R7303 Prediabetes: Secondary | ICD-10-CM

## 2021-01-29 DIAGNOSIS — I700 Atherosclerosis of aorta: Secondary | ICD-10-CM

## 2021-01-29 DIAGNOSIS — N401 Enlarged prostate with lower urinary tract symptoms: Secondary | ICD-10-CM

## 2021-01-29 NOTE — Assessment & Plan Note (Signed)
Last A1c 6.0% in March 2022.  Will check A1c today and continue to follow.  Likely will not treat unless A1c >7.0 - 7.5% given age and co morbidities.

## 2021-01-29 NOTE — Assessment & Plan Note (Signed)
Chronic.  Previously intolerant to statins, currently taking fenofibrate and Colestid.  We have discussed Zetia in the past.  Will check lipids today - he is fasting.  Follow up in 6 months.

## 2021-01-29 NOTE — Assessment & Plan Note (Signed)
Chronic.  Stable with Flomax 0.4 mg twice daily.  Denies dizziness/hypotension during the day.  Continue this medication for now.  PSA up to date.  Follow up 6 months.

## 2021-01-29 NOTE — Assessment & Plan Note (Addendum)
Chronic.  Previously intolerant to statins, currently taking fenofibrate and Colestid.  We have discussed Zetia in the past.  Will check lipids today - he is fasting.  Follow up in 6 months.

## 2021-01-29 NOTE — Assessment & Plan Note (Addendum)
Chronic.  Stable with melatonin, Ambien, Tylenol PM prn.  Plans on switching to regular Tylenol - discussed that consistent use of Benadryl is contraindicated in people >74 years old.  Melatonin is safer and this was recommended instead.  Does not need Ambien refill today.  Follow up in 6 months.

## 2021-01-29 NOTE — Progress Notes (Signed)
Subjective:    Patient ID: Brett [name], male    DOB: 05-28-1945, 75 y.o.   MRN: [id number]  HPI: Brett [name] is a 75 y.o. male presenting for  Chief Complaint  Patient presents with   Follow-up    Follow up 6 month follow up    Recently had lumbar fusion - reports pain is much improved and controlled with Tylenol 1000 mg up to four times daily.  HYPERTENSION / HYPERLIPIDEMIA Currently taking amlodipine 10 mg daily, losartan 25 mg daily.  Previously intolerant of statins, but is tolerating fenofibrate 48 mg and Colestid 1g daily well.  We have discussed Zetia in the past but he is reluctant. Satisfied with current treatment? yes Duration of hypertension: chronic BP monitoring frequency: daily BP range: 120/70s BP medication side effects: no Aspirin: no Recent stressors: no Recurrent headaches: no Visual changes:  no; going to [place] - had retinal occlusion in the past and had shots in his eye .  Vision is currently stable. Palpitations: no Dyspnea: no Chest pain: no Lower extremity edema: no Dizzy/lightheaded: no The 10-year ASCVD risk score (Arnett DK, et al., 2019) is: 24.5%   Values used to calculate the score:     Age: 87 years     Sex: Male     Is Non-Hispanic African American: No     Diabetic: No     Tobacco smoker: Yes     Systolic Blood Pressure: 114 mmHg     Is BP treated: Yes     HDL Cholesterol: 48 mg/dL     Total Cholesterol: 190 mg/dL  INSOMNIA Takes Ambien 10 mg very sparingly - less than once per month.  Does not need a refill today.  Reports he has been taking Tylenol PM almost every night to help with both pain after surgery and help with sleep.  This works well for him.  BPH We started Avodart after last visit.  This was not helpful, so he start taking tamsulosin twice daily instead and this helps with his symptoms.  BPH status: stable  Of note, labs reviewed from hospital and slightly hypocalcemia noted.     PRE-DIABETES Hypoglycemic episodes:no Polydipsia/polyuria: no Visual disturbance: no Chest pain: no Paresthesias: no Glucose Monitoring: no Diabetic Education: Completed Physical activity: stays active, walks around farm  Allergies  Allergen Reactions   Other     RSD   Gabapentin (Once-Daily)     Nausea    Statins     myalgias   Tramadol Nausea Only    Outpatient Encounter Medications as of 01/29/2021  Medication Sig Note   amLODipine (NORVASC) 10 MG tablet TAKE 1 TABLET DAILY    fenofibrate (TRICOR) 48 MG tablet TAKE 1 TABLET DAILY    losartan (COZAAR) 25 MG tablet TAKE 1 TABLET DAILY    pantoprazole (PROTONIX) 40 MG tablet Take 1 tablet (40 mg total) by mouth 2 (two) times daily.    tamsulosin (FLOMAX) 0.4 MG CAPS capsule Take 2 capsules (0.8 mg total) by mouth daily. (Patient taking differently: Take 0.4 mg by mouth in the morning and at bedtime.)    zolpidem (AMBIEN) 10 MG tablet Take 1 tablet (10 mg total) by mouth at bedtime as needed for sleep.    acetaminophen (TYLENOL) 325 MG tablet Take 1,000 mg by mouth every 6 (six) hours as needed for moderate pain.    colestipol (COLESTID) 1 g tablet TAKE 1 TABLET TWICE A DAY    [DISCONTINUED] diclofenac (VOLTAREN) 75 MG  EC tablet Take 1 tablet (75 mg total) by mouth 2 (two) times daily. (Patient not taking: No sig reported)    [DISCONTINUED] diphenoxylate-atropine (LOMOTIL) 2.5-0.025 MG tablet Take 2 tablets by mouth 4 (four) times daily as needed for diarrhea or loose stools. (Patient not taking: Reported on 01/06/2021)    [DISCONTINUED] dutasteride (AVODART) 0.5 MG capsule Take 1 capsule (0.5 mg total) by mouth daily. (Patient not taking: No sig reported)    [DISCONTINUED] methylPREDNISolone (MEDROL DOSEPAK) 4 MG TBPK tablet Use as directed (Patient not taking: No sig reported)    [DISCONTINUED] oxyCODONE (OXY IR/ROXICODONE) 5 MG immediate release tablet Take 5 mg by mouth every 6 (six) hours as needed for pain. 01/06/2021: Has  not taken yet   No facility-administered encounter medications on file as of 01/29/2021.    Patient Active Problem List   Diagnosis Date Noted   Prediabetes 01/29/2021   Spondylolisthesis at L4-L5 level 01/21/2021   Osteoarthritis of facet joint of lumbar spine 12/16/2020   Body mass index (BMI) 27.0-27.9, adult 12/16/2020   Aortic atherosclerosis (HCC) 10/09/2020   Ulnar neuropathy 10/06/2020   Stenosis of intervertebral foramina 10/06/2020   BPH (benign prostatic hyperplasia) 07/29/2020   Lumbar radiculopathy 06/24/2020   Right leg paresthesias 03/10/2020   DNR (do not resuscitate) 07/31/2019   BPV (benign positional vertigo) 05/19/2018   Pain in joint of right shoulder 05/31/2017   S/P shoulder replacement, right 04/15/2017   Osteoarthritis of right hip 12/13/2016   Essential (primary) hypertension 04/10/2014   Insomnia due to stress 04/10/2014   DDD (degenerative disc disease), cervical 04/10/2014   Mixed hyperlipidemia 04/13/2013   GERD (gastroesophageal reflux disease) 04/13/2013   OSA on CPAP 04/13/2013   Chronic shoulder pain 04/13/2013    Past Medical History:  Diagnosis Date   Allergy    seasonal   Anemia 06/2019   iron deficiency   Arthritis    Cataract    Diverticulosis    GERD (gastroesophageal reflux disease)    Hiatal hernia 2014   Dr. Elisabeth Cara [facility])   History of hiatal hernia    Hyperlipidemia    Hypertension    IBS (irritable colon syndrome)    Pneumonia    hx. of   PONV (postoperative nausea and vomiting)    with gallbladder surgery   SCC (squamous cell carcinoma) 01/02/2019   R [id number]   Sleep apnea    cpap   Vertigo     Relevant past medical, surgical, family and social history reviewed and updated as indicated. Interim medical history since our last visit reviewed.  Review of Systems Per HPI unless specifically indicated above     Objective:    BP 114/72   Pulse 69   Ht 5\' 8"  (1.727 m)   Wt 180 lb 9.6 oz (81.9 kg)   SpO2  96%   BMI 27.46 kg/m   Wt Readings from Last 3 Encounters:  01/29/21 180 lb 9.6 oz (81.9 kg)  01/21/21 180 lb (81.6 kg)  01/12/21 181 lb 4.8 oz (82.2 kg)    Physical Exam Vitals and nursing note reviewed.  Constitutional:      General: He is not in acute distress.    Appearance: Normal appearance. He is not toxic-appearing.  HENT:     Head: Normocephalic and atraumatic.  Eyes:     General: No scleral icterus.    Extraocular Movements: Extraocular movements intact.  Cardiovascular:     Rate and Rhythm: Normal rate and regular rhythm.  Heart sounds: Normal heart sounds. No murmur heard. Pulmonary:     Effort: Pulmonary effort is normal. No respiratory distress.     Breath sounds: Normal breath sounds. No wheezing, rhonchi or rales.  Musculoskeletal:        General: Normal range of motion.     Right lower leg: No edema.     Left lower leg: No edema.  Skin:    General: Skin is warm and dry.     Coloration: Skin is not jaundiced or pale.     Findings: No erythema.     Comments: Surgical incision to mid low back without s/s infection  Neurological:     Mental Status: He is alert and oriented to person, place, and time.     Motor: No weakness.     Gait: Gait normal.  Psychiatric:        Mood and Affect: Mood normal.        Behavior: Behavior normal.        Thought Content: Thought content normal.        Judgment: Judgment normal.      Assessment & Plan:   Problem List Items Addressed This Visit       Cardiovascular and Mediastinum   Essential (primary) hypertension    Chronic.  BP well controlled today in clinic and at home as well.  Will plan to continue losartan 25 mg daily, amlodipine 10 mg daily.  Check BMET with GFR today to check kidney function and electrolytes.  Follow up in 6 months.      Aortic atherosclerosis (HCC)    Chronic.  Previously intolerant to statins, currently taking fenofibrate and Colestid.  We have discussed Zetia in the past.  Will check  lipids today - he is fasting.  Follow up in 6 months.        Genitourinary   BPH (benign prostatic hyperplasia)    Chronic.  Stable with Flomax 0.4 mg twice daily.  Denies dizziness/hypotension during the day.  Continue this medication for now.  PSA up to date.  Follow up 6 months.        Other   Prediabetes    Last A1c 6.0% in March 2022.  Will check A1c today and continue to follow.  Likely will not treat unless A1c >7.0 - 7.5% given age and co morbidities.      Mixed hyperlipidemia - Primary    Chronic.  Previously intolerant to statins, currently taking fenofibrate and Colestid.  We have discussed Zetia in the past.  Will check lipids today - he is fasting.  Follow up in 6 months.      Relevant Orders   Lipid panel   Hemoglobin A1c   BASIC METABOLIC PANEL WITH GFR   Insomnia due to stress    Chronic.  Stable with melatonin, Ambien, Tylenol PM prn.  Plans on switching to regular Tylenol - discussed that consistent use of Benadryl is contraindicated in people >74 years old.  Melatonin is safer and this was recommended instead.  Does not need Ambien refill today.  Follow up in 6 months.       Other Visit Diagnoses     Need for influenza vaccination       Relevant Orders   Flu vaccine HIGH DOSE PF (Fluzone High dose) (Completed)        Follow up plan: Return in about 6 months (around 07/29/2021) for HTN/HLD, predm, insomnia f/u.

## 2021-01-29 NOTE — Assessment & Plan Note (Signed)
Chronic.  BP well controlled today in clinic and at home as well.  Will plan to continue losartan 25 mg daily, amlodipine 10 mg daily.  Check BMET with GFR today to check kidney function and electrolytes.  Follow up in 6 months.

## 2021-01-30 LAB — HEMOGLOBIN A1C
Hgb A1c MFr Bld: 5.8 % of total Hgb — ABNORMAL HIGH (ref <5.7)
Mean Plasma Glucose: 120 mg/dL
eAG (mmol/L): 6.6 mmol/L

## 2021-01-30 LAB — LIPID PANEL
Cholesterol: 189 mg/dL (ref <200)
HDL: 37 mg/dL — ABNORMAL LOW (ref >=40)
LDL Cholesterol (Calc): 118 mg/dL (calc) — ABNORMAL HIGH
Non-HDL Cholesterol (Calc): 152 mg/dL (calc) — ABNORMAL HIGH (ref <130)
Total CHOL/HDL Ratio: 5.1 (calc) — ABNORMAL HIGH (ref <5.0)
Triglycerides: 222 mg/dL — ABNORMAL HIGH (ref <150)

## 2021-01-30 LAB — BASIC METABOLIC PANEL WITH GFR
BUN: 11 mg/dL (ref 7–25)
CO2: 31 mmol/L (ref 20–32)
Calcium: 9.1 mg/dL (ref 8.6–10.3)
Chloride: 102 mmol/L (ref 98–110)
Creat: 0.85 mg/dL (ref 0.70–1.28)
Glucose, Bld: 98 mg/dL (ref 65–99)
Potassium: 4.4 mmol/L (ref 3.5–5.3)
Sodium: 139 mmol/L (ref 135–146)
eGFR: 91 mL/min/{1.73_m2} (ref >=60)

## 2021-02-26 ENCOUNTER — Other Ambulatory Visit: Payer: Self-pay | Admitting: *Deleted

## 2021-02-26 MED ORDER — COLESTIPOL HCL 1 G PO TABS: 1.0000 g | ORAL_TABLET | Freq: Two times a day (BID) | ORAL | 3 refills | Status: DC

## 2021-02-26 MED ORDER — TAMSULOSIN HCL 0.4 MG PO CAPS: 0.4000 mg | ORAL_CAPSULE | Freq: Two times a day (BID) | ORAL | 3 refills | Status: DC

## 2021-03-02 ENCOUNTER — Encounter: Payer: Self-pay | Admitting: Dermatology

## 2021-03-02 ENCOUNTER — Other Ambulatory Visit: Payer: Self-pay

## 2021-03-02 ENCOUNTER — Ambulatory Visit (INDEPENDENT_AMBULATORY_CARE_PROVIDER_SITE_OTHER): Payer: Medicare HMO | Admitting: Dermatology

## 2021-03-02 DIAGNOSIS — Z85828 Personal history of other malignant neoplasm of skin: Secondary | ICD-10-CM | POA: Diagnosis not present

## 2021-03-02 DIAGNOSIS — Z1283 Encounter for screening for malignant neoplasm of skin: Secondary | ICD-10-CM | POA: Diagnosis not present

## 2021-03-02 DIAGNOSIS — L820 Inflamed seborrheic keratosis: Secondary | ICD-10-CM | POA: Diagnosis not present

## 2021-03-03 ENCOUNTER — Encounter: Payer: Self-pay | Admitting: Nurse Practitioner

## 2021-03-03 MED ORDER — LOSARTAN POTASSIUM 25 MG PO TABS
25.0000 mg | ORAL_TABLET | Freq: Every day | ORAL | 3 refills | Status: DC
Start: 2021-03-03 — End: 2021-12-23

## 2021-03-03 MED ORDER — PANTOPRAZOLE SODIUM 40 MG PO TBEC: 40.0000 mg | DELAYED_RELEASE_TABLET | Freq: Two times a day (BID) | ORAL | 3 refills | Status: DC

## 2021-03-03 MED ORDER — COLESTIPOL HCL 1 G PO TABS: 1.0000 g | ORAL_TABLET | Freq: Two times a day (BID) | ORAL | 3 refills | Status: DC

## 2021-03-03 MED ORDER — FENOFIBRATE 48 MG PO TABS
48.0000 mg | ORAL_TABLET | Freq: Every day | ORAL | 3 refills | Status: DC
Start: 2021-03-03 — End: 2023-09-06

## 2021-03-14 ENCOUNTER — Other Ambulatory Visit: Payer: Self-pay | Admitting: Nurse Practitioner

## 2021-03-16 ENCOUNTER — Encounter: Payer: Self-pay | Admitting: Dermatology

## 2021-03-16 NOTE — Progress Notes (Signed)
   Follow-Up Visit   Subjective  Brett [name] is a 75 y.o. male who presents for the following: Annual Exam (Possible new isk left torso that itches, patient has personal history of skin cancer ).  Irritated spot to the left side, check other areas Location:  Duration:  Quality:  Associated Signs/Symptoms: Modifying Factors:  Severity:  Timing: Context:   Objective  Well appearing patient in no apparent distress; mood and affect are within normal limits. All skin waist up examined: No atypical pigmented lesions, new or recurrent nonmelanoma skin cancer.  Left Flank Slightly inflamed flattopped tan-pink textured 8 mm papule; dermoscopy compatible  Head - Anterior (Face) No sign recurrence    All skin waist up examined.   Assessment & Plan    Inflamed seborrheic keratosis Left Flank  Recheck as needed growth or bleeding.  Personal history of skin cancer Head - Anterior (Face)  Recheck as needed change  Encounter for screening for malignant neoplasm of skin  Annual skin examination, continue ultraviolet protection.      I, Lavonna Monarch, MD, have reviewed all documentation for this visit.  The documentation on 03/16/21 for the exam, diagnosis, procedures, and orders are all accurate and complete.

## 2021-03-26 ENCOUNTER — Encounter: Payer: Self-pay | Admitting: Nurse Practitioner

## 2021-03-26 MED ORDER — DICLOFENAC SODIUM 75 MG PO TBEC: 75.0000 mg | DELAYED_RELEASE_TABLET | Freq: Two times a day (BID) | ORAL | 0 refills | Status: DC

## 2021-03-26 NOTE — Telephone Encounter (Signed)
Yes

## 2021-04-23 ENCOUNTER — Other Ambulatory Visit: Payer: Self-pay | Admitting: Nurse Practitioner

## 2021-07-20 ENCOUNTER — Other Ambulatory Visit: Payer: Self-pay | Admitting: Nurse Practitioner

## 2021-07-29 ENCOUNTER — Ambulatory Visit: Payer: Medicare HMO | Admitting: Nurse Practitioner

## 2021-10-05 ENCOUNTER — Encounter (HOSPITAL_BASED_OUTPATIENT_CLINIC_OR_DEPARTMENT_OTHER): Payer: Self-pay | Admitting: *Deleted

## 2021-10-05 ENCOUNTER — Other Ambulatory Visit: Payer: Self-pay

## 2021-10-05 ENCOUNTER — Emergency Department (HOSPITAL_BASED_OUTPATIENT_CLINIC_OR_DEPARTMENT_OTHER)
Admission: EM | Admit: 2021-10-05 | Discharge: 2021-10-05 | Disposition: A | Discharge status: Home / Self Care | Payer: Medicare HMO | Attending: Emergency Medicine | Admitting: Emergency Medicine

## 2021-10-05 DIAGNOSIS — Z79899 Other long term (current) drug therapy: Secondary | ICD-10-CM | POA: Diagnosis not present

## 2021-10-05 DIAGNOSIS — L509 Urticaria, unspecified: Secondary | ICD-10-CM | POA: Insufficient documentation

## 2021-10-05 DIAGNOSIS — I10 Essential (primary) hypertension: Secondary | ICD-10-CM | POA: Insufficient documentation

## 2021-10-05 MED ORDER — PREDNISONE 50 MG PO TABS
60.0000 mg | ORAL_TABLET | Freq: Once | ORAL | Status: AC
  Administered 2021-10-05: 60 mg via ORAL
  Filled 2021-10-05: qty 1

## 2021-10-05 MED ORDER — FAMOTIDINE 20 MG PO TABS: 20.0000 mg | ORAL_TABLET | Freq: Every day | ORAL | 0 refills | Status: DC

## 2021-10-05 MED ORDER — FAMOTIDINE 20 MG PO TABS
20.0000 mg | ORAL_TABLET | Freq: Once | ORAL | Status: AC
  Administered 2021-10-05: 20 mg via ORAL
  Filled 2021-10-05: qty 1

## 2021-10-05 MED ORDER — PREDNISONE 20 MG PO TABS: 40.0000 mg | ORAL_TABLET | Freq: Every day | ORAL | 0 refills | Status: DC

## 2021-10-05 NOTE — ED Provider Notes (Signed)
[place] EMERGENCY DEPT Provider Note   CSN: [id number] Arrival date & time: 10/05/21  6712     History  Chief Complaint  Patient presents with   Allergic Reaction    REVIS WHALIN is a 76 y.o. male.  HPI     This is a 76 year old male who presents with hives.  Patient reports that he woke up around 1 AM with an itchy burning scalp and noted hives over his trunk.  He took 2 Benadryl at 130.  Denies any shortness of breath, throat swelling, nausea, vomiting.  No known allergies.  He denies any new medications, foods, detergents, soaps.  He denies any recent illnesses such as upper respiratory symptoms or fevers.  Patient reports the hives have improved after taking Benadryl.  Wife called EMS and EMS recommended that he be evaluated.  Home Medications Prior to Admission medications   Medication Sig Start Date End Date Taking? Authorizing Provider  famotidine (PEPCID) 20 MG tablet Take 1 tablet (20 mg total) by mouth daily. 10/05/21  Yes Ashlley Booher, Barbette Hair, MD  predniSONE (DELTASONE) 20 MG tablet Take 2 tablets (40 mg total) by mouth daily. 10/05/21  Yes Wauneta Silveria, Barbette Hair, MD  acetaminophen (TYLENOL) 325 MG tablet Take 1,000 mg by mouth every 6 (six) hours as needed for moderate pain.    [provider]  amLODipine (NORVASC) 10 MG tablet TAKE 1 TABLET DAILY 07/21/21   Eulogio Bear, NP  colestipol (COLESTID) 1 g tablet Take 1 tablet (1 g total) by mouth 2 (two) times daily. 03/03/21   Eulogio Bear, NP  diclofenac (VOLTAREN) 75 MG EC tablet TAKE 1 TABLET TWICE A DAY 04/24/21   Eulogio Bear, NP  ezetimibe (ZETIA) 10 MG tablet Take 10 mg by mouth at bedtime. 09/01/21   [provider]  fenofibrate (TRICOR) 48 MG tablet Take 1 tablet (48 mg total) by mouth daily. 03/03/21   Eulogio Bear, NP  losartan (COZAAR) 25 MG tablet Take 1 tablet (25 mg total) by mouth daily. 03/03/21   Eulogio Bear, NP  losartan (COZAAR) 50 MG tablet  Take 50 mg by mouth daily. 09/01/21   [provider]  pantoprazole (PROTONIX) 40 MG tablet Take 1 tablet (40 mg total) by mouth 2 (two) times daily. 03/03/21   Eulogio Bear, NP  tamsulosin (FLOMAX) 0.4 MG CAPS capsule Take 1 capsule (0.4 mg total) by mouth in the morning and at bedtime. 02/26/21   Eulogio Bear, NP  zolpidem (AMBIEN) 10 MG tablet Take 1 tablet (10 mg total) by mouth at bedtime as needed for sleep. 07/29/20 01/29/21  Eulogio Bear, NP      Allergies    Other, Gabapentin (once-daily), Statins, and Tramadol    Review of Systems   Review of Systems  HENT:  Negative for trouble swallowing.   Respiratory:  Negative for shortness of breath.   Skin:  Positive for rash.  All other systems reviewed and are negative.  Physical Exam Updated Vital Signs BP 132/62   Pulse (!) 57   Temp 98.1 F (36.7 C)   Resp 20   Ht 1.727 m ('5\' 8"'$ )   Wt 86.2 kg   SpO2 96%   BMI 28.89 kg/m  Physical Exam Vitals and nursing note reviewed.  Constitutional:      Appearance: He is well-developed.     Comments: ABCs intact  HENT:     Head: Normocephalic and atraumatic.     Mouth/Throat:  Comments: Oropharynx moist and clear, no swelling noted Eyes:     Pupils: Pupils are equal, round, and reactive to light.  Cardiovascular:     Rate and Rhythm: Normal rate and regular rhythm.     Heart sounds: Normal heart sounds. No murmur heard. Pulmonary:     Effort: Pulmonary effort is normal. No respiratory distress.     Breath sounds: Normal breath sounds. No wheezing.  Abdominal:     Palpations: Abdomen is soft.     Tenderness: There is no abdominal tenderness.  Musculoskeletal:     Cervical back: Neck supple.  Lymphadenopathy:     Cervical: No cervical adenopathy.  Skin:    General: Skin is warm and dry.     Comments: Scattered hives noted mostly on the upper anterior chest and into the proximal arms  Neurological:     Mental Status: He is alert and oriented  to person, place, and time.  Psychiatric:        Mood and Affect: Mood normal.    ED Results / Procedures / Treatments   Labs (all labs ordered are listed, but only abnormal results are displayed) Labs Reviewed - No data to display  EKG None  Radiology No results found.  Procedures Procedures    Medications Ordered in ED Medications  predniSONE (DELTASONE) tablet 60 mg (has no administration in time range)  famotidine (PEPCID) tablet 20 mg (has no administration in time range)    ED Course/ Medical Decision Making/ A&P                           Medical Decision Making Risk Prescription drug management.   This patient presents to the ED for concern of allergic reaction, this involves an extensive number of treatment options, and is a complaint that carries with it a high risk of complications and morbidity.  I considered the following differential and admission for this acute, potentially life threatening condition.  The differential diagnosis includes hives, anaphylaxis, allergy, viral illness  MDM:    This is a 76 year old male who presents with rash.  Rash is consistent with hives.  He is nontoxic and vital signs are reassuring.  ABCs are intact.  No signs or symptoms of anaphylaxis at this time.  Unclear culprit.  He denies any viral symptoms or new exposures.  Patient already reports improvement with Benadryl.  I have added Pepcid and prednisone.  Will initiate a burst course of steroids and add Benadryl over the next several days.  I discussed with patient symptoms that would warrant return including signs and symptoms of anaphylaxis.  He stated understanding.  (Labs, imaging, consults)  Labs: I Ordered, and personally interpreted labs.  The pertinent results include: None  Imaging Studies ordered: I ordered imaging studies including none I independently visualized and interpreted imaging. I agree with the radiologist interpretation  Additional history obtained  from wife at bedside.  External records from outside source obtained and reviewed including prior evaluations  Cardiac Monitoring: The patient was maintained on a cardiac monitor.  I personally viewed and interpreted the cardiac monitored which showed an underlying rhythm of: Normal sinus rhythm  Reevaluation: After the interventions noted above, I reevaluated the patient and found that they have :improved  Social Determinants of Health: Lives independently  Disposition: Discharge  Co morbidities that complicate the patient evaluation  Past Medical History:  Diagnosis Date   Allergy    seasonal   Anemia 06/2019  iron deficiency   Arthritis    Cataract    Diverticulosis    GERD (gastroesophageal reflux disease)    Hiatal hernia 2014   Dr. Lyda Perone [facility])   History of hiatal hernia    Hyperlipidemia    Hypertension    IBS (irritable colon syndrome)    Pneumonia    hx. of   PONV (postoperative nausea and vomiting)    with gallbladder surgery   SCC (squamous cell carcinoma) 01/02/2019   R [id number]   Sleep apnea    cpap   Vertigo      Medicines Meds ordered this encounter  Medications   predniSONE (DELTASONE) tablet 60 mg   famotidine (PEPCID) tablet 20 mg   famotidine (PEPCID) 20 MG tablet    Sig: Take 1 tablet (20 mg total) by mouth daily.    Dispense:  10 tablet    Refill:  0   predniSONE (DELTASONE) 20 MG tablet    Sig: Take 2 tablets (40 mg total) by mouth daily.    Dispense:  8 tablet    Refill:  0    I have reviewed the patients home medicines and have made adjustments as needed  Problem List / ED Course: Problem List Items Addressed This Visit   None Visit Diagnoses     Hives    -  Primary                   Final Clinical Impression(s) / ED Diagnoses Final diagnoses:  Hives    Rx / DC Orders ED Discharge Orders          Ordered    famotidine (PEPCID) 20 MG tablet  Daily        10/05/21 0301    predniSONE (DELTASONE) 20  MG tablet  Daily        10/05/21 0301              Merryl Hacker, MD 10/05/21 [phone]

## 2021-10-05 NOTE — ED Notes (Signed)
Pt verbalizes understanding of discharge instructions. Opportunity for questioning and answers were provided. Pt discharged from ED to home.   ? ?

## 2021-10-05 NOTE — ED Triage Notes (Addendum)
Pt states he woke up with hives at 1am. States he took 2 benadryl at 130. Denies any new meds, products, or foods. Denies any sob. States benadryl has helped. Hives located to to anterior chest and upper extremities. Denies any cp. Was seen by EMS and told to come here for evaluation.

## 2021-10-05 NOTE — Discharge Instructions (Addendum)
You were seen today for hives.  The culprit is unknown known.  Continue steroids and Pepcid for the next 4 to 5 days.  You may take your first dose of prednisone on 5/23.  Monitor for signs and symptoms of anaphylaxis including shortness of breath, swelling, nausea or vomiting.  If these occur, call 911 immediately

## 2021-11-24 ENCOUNTER — Ambulatory Visit (INDEPENDENT_AMBULATORY_CARE_PROVIDER_SITE_OTHER): Payer: Medicare HMO

## 2021-11-24 ENCOUNTER — Other Ambulatory Visit: Payer: Self-pay | Admitting: Neurosurgery

## 2021-11-24 ENCOUNTER — Other Ambulatory Visit: Payer: Self-pay | Admitting: Nurse Practitioner

## 2021-11-24 DIAGNOSIS — M47816 Spondylosis without myelopathy or radiculopathy, lumbar region: Secondary | ICD-10-CM

## 2021-11-24 DIAGNOSIS — M5416 Radiculopathy, lumbar region: Secondary | ICD-10-CM | POA: Diagnosis not present

## 2021-11-24 NOTE — Telephone Encounter (Signed)
Requested by interface surescripts. Provider not at this practice  Requested Prescriptions  Refused Prescriptions Disp Refills  . tamsulosin (FLOMAX) 0.4 MG CAPS capsule [Pharmacy Med Name: Tamsulosin HCl 0.4 MG Oral Capsule] 180 capsule 3    Sig: TAKE 1 CAPSULE IN THE MORNING  AND AT BEDTIME     Urology: Alpha-Adrenergic Blocker Failed - 11/24/2021  2:47 AM      Failed - PSA in normal range and within 360 days    PSA  Date Value Ref Range Status  07/29/2020 0.58 < OR = 4.0 ng/mL Final    Comment:    The total PSA value from this assay system is  standardized against the WHO standard. The test  result will be approximately 20% lower when compared  to the equimolar-standardized total PSA (Beckman  Coulter). Comparison of serial PSA results should be  interpreted with this fact in mind. . This test was performed using the Siemens  chemiluminescent method. Values obtained from  different assay methods cannot be used interchangeably. PSA levels, regardless of value, should not be interpreted as absolute evidence of the presence or absence of disease.          Passed - Last BP in normal range    BP Readings from Last 1 Encounters:  10/05/21 125/64         Passed - Valid encounter within last 12 months    Recent Outpatient Visits          9 months ago Mixed hyperlipidemia   [facility] Eulogio Bear, NP   1 year ago Viral colitis   [facility] Dennard Schaumann Cammie Mcgee, MD   1 year ago  Eulogio Bear, NP   1 year ago [place] Susy Frizzle, MD   1 year ago Annual physical exam   [place] Eulogio Bear, NP      Future Appointments            In 3 months Lavonna Monarch, MD [place] [facility], [place]

## 2021-12-23 ENCOUNTER — Encounter: Payer: Self-pay | Admitting: Cardiology

## 2021-12-23 ENCOUNTER — Ambulatory Visit: Payer: Medicare HMO | Admitting: Cardiology

## 2021-12-23 VITALS — BP 141/69 | HR 55 | Temp 98.0°F | Resp 16 | Ht 68.0 in | Wt 188.0 lb

## 2021-12-23 DIAGNOSIS — Z789 Other specified health status: Secondary | ICD-10-CM

## 2021-12-23 DIAGNOSIS — R0609 Other forms of dyspnea: Secondary | ICD-10-CM

## 2021-12-23 DIAGNOSIS — Z87891 Personal history of nicotine dependence: Secondary | ICD-10-CM

## 2021-12-23 DIAGNOSIS — G4733 Obstructive sleep apnea (adult) (pediatric): Secondary | ICD-10-CM

## 2021-12-23 DIAGNOSIS — E781 Pure hyperglyceridemia: Secondary | ICD-10-CM

## 2021-12-23 DIAGNOSIS — R011 Cardiac murmur, unspecified: Secondary | ICD-10-CM

## 2021-12-23 DIAGNOSIS — I10 Essential (primary) hypertension: Secondary | ICD-10-CM

## 2021-12-23 DIAGNOSIS — E7800 Pure hypercholesterolemia, unspecified: Secondary | ICD-10-CM

## 2021-12-23 NOTE — Progress Notes (Signed)
ID:  Brett [name], DOB 09-Aug-1945, MRN [id number]  PCP:  Lewis Moccasin, MD  Cardiologist:  Tessa Lerner, DO, [facility] (established care 12/23/2021).  REASON FOR CONSULT: Heart murmur.   REQUESTING PHYSICIAN:  Lewis Moccasin, MD [address] [place],  [place] [address]  Chief Complaint  Patient presents with   Heart Murmur   New Patient (Initial Visit)    HPI  Brett [name] is a 76 y.o. Caucasian male who presents to the clinic for evaluation of heart murmur at the request of Lewis Moccasin, MD. His past medical history and cardiovascular risk factors include: Hyperlipidemia, statin intolerance, GERD, hypertension, hiatal hernia, iron deficiency anemia, former smoker, hypertriglyceridemia, OSA and CPAP.  Patient is accompanied by his wife at today's office visit.  He is referred to the practice for evaluation of a cardiac murmur which was noted on a Medicare annual well visit.  He denies any chest pain at rest or with effort related activities.  He does have shortness of breath with effort related activities which is chronic and stable and mostly pronounced with over exertional activities.   No prior history of near syncope, syncope, or heart failure symptoms.  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  ALLERGIES: Allergies  Allergen Reactions   Other     RSD   Gabapentin (Once-Daily)     Nausea    Statins     myalgias   Tramadol Nausea Only    MEDICATION LIST PRIOR TO VISIT: Current Meds  Medication Sig   amLODipine (NORVASC) 10 MG tablet TAKE 1 TABLET DAILY   colestipol (COLESTID) 1 g tablet Take 1 tablet (1 g total) by mouth 2 (two) times daily.   diclofenac (VOLTAREN) 75 MG EC tablet TAKE 1 TABLET TWICE A DAY   ezetimibe (ZETIA) 10 MG tablet Take 10 mg by mouth at bedtime.   famotidine (PEPCID) 20 MG tablet Take 1 tablet (20 mg total) by mouth daily.   fenofibrate (TRICOR) 48 MG tablet Take 1 tablet (48 mg total) by mouth  daily.   losartan (COZAAR) 50 MG tablet Take 50 mg by mouth daily.   pantoprazole (PROTONIX) 40 MG tablet Take 1 tablet (40 mg total) by mouth 2 (two) times daily.   tamsulosin (FLOMAX) 0.4 MG CAPS capsule Take 1 capsule (0.4 mg total) by mouth in the morning and at bedtime.   zolpidem (AMBIEN) 10 MG tablet Take 1 tablet (10 mg total) by mouth at bedtime as needed for sleep.   [DISCONTINUED] losartan (COZAAR) 25 MG tablet Take 1 tablet (25 mg total) by mouth daily.     PAST MEDICAL HISTORY: Past Medical History:  Diagnosis Date   Allergy    seasonal   Anemia 06/2019   iron deficiency   Arthritis    Cataract    Diverticulosis    GERD (gastroesophageal reflux disease)    Hiatal hernia 2014   Dr. Elisabeth Cara [facility])   History of hiatal hernia    Hyperlipidemia    Hypertension    IBS (irritable colon syndrome)    Pneumonia    hx. of   PONV (postoperative nausea and vomiting)    with gallbladder surgery   SCC (squamous cell carcinoma) 01/02/2019   R temple-[id number]   Sleep apnea    cpap   Vertigo     PAST SURGICAL HISTORY: Past Surgical History:  Procedure Laterality Date   CARPAL TUNNEL RELEASE Bilateral    CHOLECYSTECTOMY  2004  Post-cholecystectomy syndrome    COLONOSCOPY     ESOPHAGOGASTRODUODENOSCOPY     EYE SURGERY     cataract   FRACTURE SURGERY  2006   fx. wrist   JOINT REPLACEMENT     KNEE SURGERY Left 2020   rhino plasty     deviated septum   SHOULDER SURGERY  2011   rotator cuff   TOTAL HIP ARTHROPLASTY Right 12/13/2016   Procedure: RIGHT TOTAL HIP ARTHROPLASTY ANTERIOR APPROACH;  Surgeon: Samson Frederic, MD;  Location: [facility] OR;  Service: Orthopedics;  Laterality: Right;   TOTAL SHOULDER ARTHROPLASTY Right 04/15/2017   Procedure: RIGHT TOTAL SHOULDER ARTHROPLASTY;  Surgeon: Beverely Low, MD;  Location: [facility] OR;  Service: Orthopedics;  Laterality: Right;   TYMPANOSTOMY TUBE PLACEMENT Left 2020   UPPER GASTROINTESTINAL ENDOSCOPY     VASECTOMY  1980    FAMILY  HISTORY: The patient family history includes Cancer in his maternal grandmother and sister.  SOCIAL HISTORY:  The patient  reports that he quit smoking about 11 years ago. His smoking use included cigarettes and cigars. He started smoking about 60 years ago. He has a 102.00 pack-year smoking history. He has never used smokeless tobacco. He reports that he does not drink alcohol and does not use drugs.  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 Negative for chest pain, claudication, irregular heartbeat, leg swelling, near-syncope, orthopnea, palpitations, paroxysmal nocturnal dyspnea and syncope.  Respiratory:  Negative for shortness of breath.   Hematologic/Lymphatic: Negative for bleeding problem.  Musculoskeletal:  Negative for muscle cramps and myalgias.  Neurological:  Positive for dizziness (with change in position) and light-headedness (with change of position).    PHYSICAL EXAM:    12/23/2021    8:33 AM 10/05/2021    3:00 AM 10/05/2021    2:43 AM  Vitals with BMI  Height 5\' 8"     Weight 188 lbs    BMI 28.59    Systolic 141 125 811  Diastolic 69 64 62  Pulse 55 55 57   Physical Exam  Constitutional: No distress.  Age appropriate, hemodynamically stable.   HENT:  Frank-sign  Neck: No JVD present.  Cardiovascular: Normal rate, regular rhythm, S1 normal, S2 normal, intact distal pulses and normal pulses. Exam reveals no gallop, no S3 and no S4.  Murmur heard. Holosystolic murmur is present with a grade of 3/6 at the apex. Pulses:      Dorsalis pedis pulses are 2+ on the right side and 2+ on the left side.       Posterior tibial pulses are 2+ on the right side and 2+ on the left side.  Pulmonary/Chest: Effort normal and breath sounds normal. No stridor. He has no wheezes. He has no rales.  Abdominal: Soft. Bowel sounds are normal. He exhibits no distension. There is no  abdominal tenderness.  Musculoskeletal:        General: No edema.     Cervical back: Neck supple.  Neurological: He is alert and oriented to person, place, and time. He has intact cranial nerves (2-12).  Skin: Skin is warm and moist.   CARDIAC DATABASE: EKG: 12/23/2021: Sinus bradycardia, 52 bpm, without underlying ischemic injury pattern.   Echocardiogram: No results found for this or any previous visit from the past 1095 days.   Stress Testing: No results found for this or any previous visit from the past 1095 days.  Heart Catheterization: None  LABORATORY  DATA:    Latest Ref Rng & Units 01/12/2021    9:07 AM 10/06/2020    9:59 AM 10/02/2020   11:48 AM  CBC  WBC 4.0 - 10.5 K/uL 7.0  11.5  11.2   Hemoglobin 13.0 - 17.0 g/dL 40.9  81.1  91.4   Hematocrit 39.0 - 52.0 % 41.9  42.5  39.6   Platelets 150 - 400 K/uL 307  391  266        Latest Ref Rng & Units 01/29/2021    8:35 AM 01/12/2021    9:07 AM 10/06/2020    9:59 AM  CMP  Glucose 65 - 99 mg/dL 98  96  782   BUN 7 - 25 mg/dL 11  10  7    Creatinine 0.70 - 1.28 mg/dL 9.56  2.13  0.86   Sodium 135 - 146 mmol/L 139  136  139   Potassium 3.5 - 5.3 mmol/L 4.4  4.1  3.8   Chloride 98 - 110 mmol/L 102  104  102   CO2 20 - 32 mmol/L 31  27  30    Calcium 8.6 - 10.3 mg/dL 9.1  8.7  8.9     Lipid Panel     Component Value Date/Time   CHOL 189 01/29/2021 0835   TRIG 222 (H) 01/29/2021 0835   HDL 37 (L) 01/29/2021 0835   CHOLHDL 5.1 (H) 01/29/2021 0835   VLDL 29 11/01/2016 0820   LDLCALC 118 (H) 01/29/2021 0835    No components found for: "NTPROBNP" No results for input(s): "PROBNP" in the last 8760 hours. No results for input(s): "TSH" in the last 8760 hours.  BMP Recent Labs    01/12/21 0907 01/29/21 0835  NA 136 139  K 4.1 4.4  CL 104 102  CO2 27 31  GLUCOSE 96 98  BUN 10 11  CREATININE 0.92 0.85  CALCIUM 8.7* 9.1  GFRNONAA >60  --     HEMOGLOBIN A1C Lab Results  Component Value Date   HGBA1C 5.8 (H)  01/29/2021   MPG 120 01/29/2021    IMPRESSION:    ICD-10-CM   1. Cardiac murmur  R01.1     2. Essential (primary) hypertension  I10 EKG 12-Lead    3. OSA on CPAP  G47.33    Z99.89     4. Pure hypercholesterolemia  E78.00     5. Statin intolerance  Z78.9     6. Former smoker  Z87.891     7. Hypertriglyceridemia  E78.1        RECOMMENDATIONS: Brett [name] is a 76 y.o. Caucasian male whose past medical history and cardiac risk factors include: Hyperlipidemia, statin intolerance, GERD, hypertension, hiatal hernia, iron deficiency anemia, former smoker, hypertriglyceridemia, OSA and CPAP.  Dyspnea on exertion Chronic and stable. Differential diagnosis includes but not limited to: COPD/emphysema, deconditioning, heart disease. Plan exercise treadmill stress test as the patient is able to exercise and EKG is interpretable to evaluate for functional status of exercise ischemia. Patient is asked to follow-up with pulmonary medicine and/or PCP for COPD/emphysema evaluation. Patient is also asked to keep a log of his blood pressures with a goal SBP around 130 mmHg if able to tolerate.  Cardiac murmur Grade 3 out of 6 holosystolic murmur heard at the apex. Echo will be ordered to evaluate for structural heart disease and left ventricular systolic function.  Essential (primary) hypertension Monitor BP Recommend the importance of a low-salt diet. Blood pressure management per primary team.  OSA  on CPAP Reemphasized the importance of CPAP compliance  Pure hypercholesterolemia / Statin intolerance / Hypertriglyceridemia Has been intolerant to statin medications in the past. Currently on Zetia and fenofibrate. I do not have the most recent labs available to review.  Patient states that he had lipids checked in May 2023.  Have requested him to bring in his labs at the next visit for review. If warranted we could discuss options such as Leqvio or Repatha based on his  numbers.  FINAL MEDICATION LIST END OF ENCOUNTER: No orders of the defined types were placed in this encounter.   Medications Discontinued During This Encounter  Medication Reason   acetaminophen (TYLENOL) 325 MG tablet    predniSONE (DELTASONE) 20 MG tablet    losartan (COZAAR) 25 MG tablet Patient Preference     Current Outpatient Medications:    amLODipine (NORVASC) 10 MG tablet, TAKE 1 TABLET DAILY, Disp: 90 tablet, Rfl: 1   colestipol (COLESTID) 1 g tablet, Take 1 tablet (1 g total) by mouth 2 (two) times daily., Disp: 180 tablet, Rfl: 3   diclofenac (VOLTAREN) 75 MG EC tablet, TAKE 1 TABLET TWICE A DAY, Disp: 60 tablet, Rfl: 11   ezetimibe (ZETIA) 10 MG tablet, Take 10 mg by mouth at bedtime., Disp: , Rfl:    famotidine (PEPCID) 20 MG tablet, Take 1 tablet (20 mg total) by mouth daily., Disp: 10 tablet, Rfl: 0   fenofibrate (TRICOR) 48 MG tablet, Take 1 tablet (48 mg total) by mouth daily., Disp: 90 tablet, Rfl: 3   losartan (COZAAR) 50 MG tablet, Take 50 mg by mouth daily., Disp: , Rfl:    pantoprazole (PROTONIX) 40 MG tablet, Take 1 tablet (40 mg total) by mouth 2 (two) times daily., Disp: 180 tablet, Rfl: 3   tamsulosin (FLOMAX) 0.4 MG CAPS capsule, Take 1 capsule (0.4 mg total) by mouth in the morning and at bedtime., Disp: 180 capsule, Rfl: 3   zolpidem (AMBIEN) 10 MG tablet, Take 1 tablet (10 mg total) by mouth at bedtime as needed for sleep., Disp: 15 tablet, Rfl: 1  Orders Placed This Encounter  Procedures   EKG 12-Lead    There are no Patient Instructions on file for this visit.   --Continue cardiac medications as reconciled in final medication list. --No follow-ups on file. or sooner if needed. --Continue follow-up with your primary care physician regarding the management of your other chronic comorbid conditions.  Patient's questions and concerns were addressed to his satisfaction. He voices understanding of the instructions provided during this encounter.   This  note was created using a voice recognition software as a result there may be grammatical errors inadvertently enclosed that do not reflect the nature of this encounter. Every attempt is made to correct such errors.  Tessa Lerner, [place], [facility]  Pager: [phone] Office: [phone]

## 2022-01-29 ENCOUNTER — Ambulatory Visit: Payer: Medicare HMO

## 2022-01-29 DIAGNOSIS — R0609 Other forms of dyspnea: Secondary | ICD-10-CM

## 2022-01-29 DIAGNOSIS — R011 Cardiac murmur, unspecified: Secondary | ICD-10-CM

## 2022-02-12 ENCOUNTER — Ambulatory Visit: Payer: Medicare HMO

## 2022-02-12 DIAGNOSIS — R0609 Other forms of dyspnea: Secondary | ICD-10-CM

## 2022-02-22 ENCOUNTER — Encounter: Payer: Self-pay | Admitting: Cardiology

## 2022-02-22 ENCOUNTER — Ambulatory Visit: Payer: Medicare HMO | Admitting: Cardiology

## 2022-02-22 VITALS — BP 122/60 | HR 62 | Temp 97.8°F | Resp 16 | Ht 68.0 in | Wt 190.0 lb

## 2022-02-22 DIAGNOSIS — E782 Mixed hyperlipidemia: Secondary | ICD-10-CM

## 2022-02-22 DIAGNOSIS — E7800 Pure hypercholesterolemia, unspecified: Secondary | ICD-10-CM

## 2022-02-22 DIAGNOSIS — I700 Atherosclerosis of aorta: Secondary | ICD-10-CM

## 2022-02-22 DIAGNOSIS — Z789 Other specified health status: Secondary | ICD-10-CM

## 2022-02-22 DIAGNOSIS — E781 Pure hyperglyceridemia: Secondary | ICD-10-CM

## 2022-02-22 DIAGNOSIS — Z87891 Personal history of nicotine dependence: Secondary | ICD-10-CM

## 2022-02-22 DIAGNOSIS — I340 Nonrheumatic mitral (valve) insufficiency: Secondary | ICD-10-CM

## 2022-02-22 DIAGNOSIS — G4733 Obstructive sleep apnea (adult) (pediatric): Secondary | ICD-10-CM

## 2022-02-22 DIAGNOSIS — I10 Essential (primary) hypertension: Secondary | ICD-10-CM

## 2022-02-22 NOTE — Progress Notes (Unsigned)
ID:  Brett [name], DOB [birth year], MRN [id number]  PCP:  Lewis Moccasin, MD  Cardiologist:  Tessa Lerner, DO, [facility] (established care 12/23/2021).  Date: 02/22/22 Last Office Visit: 12/23/2021  Chief Complaint  Patient presents with   Dyspnea on exertion   Results   Follow-up    HPI  Brett [name] is a 76 y.o. Caucasian male whose past medical history and cardiovascular risk factors include: Mitral regurgitation, hyperlipidemia, statin intolerance, GERD, hypertension, hiatal hernia, iron deficiency anemia, former smoker, hypertriglyceridemia, OSA and CPAP.  Referred to the practice for evaluation of a cardiac murmur which was auscultated on his annual Medicare well visit exam.  This was reproducible on examination during the last office visit.  Echocardiogram notes preserved LVEF, grade 1 diastolic impairment, and mitral regurgitation.  Patient does not have any symptoms to suggest congestive heart failure or volume overload.  Overall functional capacity remains stable.  And dyspnea on exertion is chronic and stable.  Patient states that the symptoms are usually brought on by overexertion such as cutting wood.  GXT since last office visit is also low risk and overall he is asymptomatic.  He is accompanied by his wife at today's office visit as well.  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  ALLERGIES: Allergies  Allergen Reactions   Other     RSD   Gabapentin (Once-Daily)     Nausea    Statins     myalgias   Tramadol Nausea Only    MEDICATION LIST PRIOR TO VISIT: Current Meds  Medication Sig   amLODipine (NORVASC) 10 MG tablet TAKE 1 TABLET DAILY   colestipol (COLESTID) 1 g tablet Take 1 tablet (1 g total) by mouth 2 (two) times daily.   diclofenac (VOLTAREN) 75 MG EC tablet TAKE 1 TABLET TWICE A DAY   ezetimibe (ZETIA) 10 MG tablet Take 10 mg by mouth at bedtime.   famotidine (PEPCID) 20 MG tablet Take 1 tablet (20 mg total) by  mouth daily.   fenofibrate (TRICOR) 48 MG tablet Take 1 tablet (48 mg total) by mouth daily.   losartan (COZAAR) 50 MG tablet Take 50 mg by mouth daily.   pantoprazole (PROTONIX) 40 MG tablet Take 1 tablet (40 mg total) by mouth 2 (two) times daily.   tamsulosin (FLOMAX) 0.4 MG CAPS capsule Take 1 capsule (0.4 mg total) by mouth in the morning and at bedtime.   zolpidem (AMBIEN) 10 MG tablet Take 1 tablet (10 mg total) by mouth at bedtime as needed for sleep.     PAST MEDICAL HISTORY: Past Medical History:  Diagnosis Date   Allergy    seasonal   Anemia 06/2019   iron deficiency   Arthritis    Cataract    Diverticulosis    GERD (gastroesophageal reflux disease)    Hiatal hernia 2014   Dr. Elisabeth Cara [facility])   History of hiatal hernia    Hyperlipidemia    Hypertension    IBS (irritable colon syndrome)    Pneumonia    hx. of   PONV (postoperative nausea and vomiting)    with gallbladder surgery   SCC (squamous cell carcinoma) 01/02/2019   R temple-txpbx   Sleep apnea    cpap   Vertigo     PAST SURGICAL HISTORY: Past Surgical History:  Procedure Laterality Date   CARPAL TUNNEL RELEASE Bilateral    CHOLECYSTECTOMY  2004   Post-cholecystectomy syndrome    COLONOSCOPY     ESOPHAGOGASTRODUODENOSCOPY  EYE SURGERY     cataract   FRACTURE SURGERY  2006   fx. wrist   JOINT REPLACEMENT     KNEE SURGERY Left 2020   rhino plasty     deviated septum   SHOULDER SURGERY  2011   rotator cuff   TOTAL HIP ARTHROPLASTY Right 12/13/2016   Procedure: RIGHT TOTAL HIP ARTHROPLASTY ANTERIOR APPROACH;  Surgeon: Samson Frederic, MD;  Location: [facility] OR;  Service: Orthopedics;  Laterality: Right;   TOTAL SHOULDER ARTHROPLASTY Right 04/15/2017   Procedure: RIGHT TOTAL SHOULDER ARTHROPLASTY;  Surgeon: Beverely Low, MD;  Location: [facility] OR;  Service: Orthopedics;  Laterality: Right;   TYMPANOSTOMY TUBE PLACEMENT Left 2020   UPPER GASTROINTESTINAL ENDOSCOPY     VASECTOMY  1980    FAMILY  HISTORY: The patient family history includes Cancer in his maternal grandmother and sister.  SOCIAL HISTORY:  The patient  reports that he quit smoking about 11 years ago. His smoking use included cigarettes and cigars. He started smoking about 60 years ago. He has a 102.00 pack-year smoking history. He has never used smokeless tobacco. He reports that he does not drink alcohol and does not use drugs.  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 Negative for chest pain, claudication, irregular heartbeat, leg swelling, near-syncope, orthopnea, palpitations, paroxysmal nocturnal dyspnea and syncope.  Respiratory:  Negative for shortness of breath.   Hematologic/Lymphatic: Negative for bleeding problem.  Musculoskeletal:  Negative for muscle cramps and myalgias.  Neurological:  Negative for dizziness and light-headedness.    PHYSICAL EXAM:    02/22/2022    9:38 AM 12/23/2021    8:33 AM 10/05/2021    3:00 AM  Vitals with BMI  Height 5\' 8"  5\' 8"    Weight 190 lbs 188 lbs   BMI 28.9 28.59   Systolic 122 141 347  Diastolic 60 69 64  Pulse 62 55 55   Physical Exam  Constitutional: No distress.  Age appropriate, hemodynamically stable.   HENT:  Frank-sign  Neck: No JVD present.  Cardiovascular: Normal rate, regular rhythm, S1 normal, S2 normal, intact distal pulses and normal pulses. Exam reveals no gallop, no S3 and no S4.  Murmur heard. Holosystolic murmur is present with a grade of 3/6 at the apex. Pulses:      Dorsalis pedis pulses are 2+ on the right side and 2+ on the left side.       Posterior tibial pulses are 2+ on the right side and 2+ on the left side.  Pulmonary/Chest: Effort normal and breath sounds normal. No stridor. He has no wheezes. He has no rales.  Abdominal: Soft. Bowel sounds are normal. He exhibits no distension. There is no abdominal tenderness.  Musculoskeletal:         General: No edema.     Cervical back: Neck supple.  Neurological: He is alert and oriented to person, place, and time. He has intact cranial nerves (2-12).  Skin: Skin is warm and moist.   CARDIAC DATABASE: EKG: 12/23/2021: Sinus bradycardia, 52 bpm, without underlying ischemic injury pattern.   Echocardiogram: 01/29/2022: Normal LV systolic function with visual EF 60-65%. Left ventricle cavity is normal in size. Normal left ventricular wall thickness. Normal global wall motion. Doppler evidence of grade I (impaired) diastolic dysfunction, normal LAP. Calculated EF 64%. Trace aortic regurgitation. Moderate (Grade II) mitral regurgitation. Mild tricuspid regurgitation. No evidence of pulmonary hypertension. IVC is dilated with respiratory  variation.   Stress Testing: Exercise treadmill stress test 02/12/2022: Exercise treadmill stress test performed using Bruce protocol.  Patient reached 8.8 METS, and 87% of age predicted maximum heart rate.  Exercise capacity was good.  No chest pain reported.  Normal heart rate and hemodynamic response. Stress EKG revealed no ischemic changes. Low risk study.  Heart Catheterization: None  LABORATORY DATA:    Latest Ref Rng & Units 01/12/2021    9:07 AM 10/06/2020    9:59 AM 10/02/2020   11:48 AM  CBC  WBC 4.0 - 10.5 K/uL 7.0  11.5  11.2   Hemoglobin 13.0 - 17.0 g/dL 16.1  09.6  04.5   Hematocrit 39.0 - 52.0 % 41.9  42.5  39.6   Platelets 150 - 400 K/uL 307  391  266        Latest Ref Rng & Units 01/29/2021    8:35 AM 01/12/2021    9:07 AM 10/06/2020    9:59 AM  CMP  Glucose 65 - 99 mg/dL 98  96  409   BUN 7 - 25 mg/dL 11  10  7    Creatinine 0.70 - 1.28 mg/dL 8.11  9.14  7.82   Sodium 135 - 146 mmol/L 139  136  139   Potassium 3.5 - 5.3 mmol/L 4.4  4.1  3.8   Chloride 98 - 110 mmol/L 102  104  102   CO2 20 - 32 mmol/L 31  27  30    Calcium 8.6 - 10.3 mg/dL 9.1  8.7  8.9     Lipid Panel     Component Value Date/Time   CHOL 189  01/29/2021 0835   TRIG 222 (H) 01/29/2021 0835   HDL 37 (L) 01/29/2021 0835   CHOLHDL 5.1 (H) 01/29/2021 0835   VLDL 29 11/01/2016 0820   LDLCALC 118 (H) 01/29/2021 0835    No components found for: "NTPROBNP" No results for input(s): "PROBNP" in the last 8760 hours. No results for input(s): "TSH" in the last 8760 hours.  BMP No results for input(s): "NA", "K", "CL", "CO2", "GLUCOSE", "BUN", "CREATININE", "CALCIUM", "GFRNONAA", "GFRAA" in the last 8760 hours.   HEMOGLOBIN A1C Lab Results  Component Value Date   HGBA1C 5.8 (H) 01/29/2021   MPG 120 01/29/2021    IMPRESSION:  No diagnosis found.    RECOMMENDATIONS: Brett [name] is a 76 y.o. Caucasian male whose past medical history and cardiac risk factors include: Hyperlipidemia, statin intolerance, GERD, hypertension, hiatal hernia, iron deficiency anemia, former smoker, hypertriglyceridemia, OSA and CPAP.  Nonrheumatic mitral valve regurgitation ***  Essential (primary) hypertension ***  Mixed hyperlipidemia ***  OSA on CPAP ***  Aortic atherosclerosis (HCC) ***  Pure hypercholesterolemia ***  Hypertriglyceridemia ***  Statin intolerance ***  Former smoker ***   FINAL MEDICATION LIST END OF ENCOUNTER: No orders of the defined types were placed in this encounter.   There are no discontinued medications.    Current Outpatient Medications:    amLODipine (NORVASC) 10 MG tablet, TAKE 1 TABLET DAILY, Disp: 90 tablet, Rfl: 1   colestipol (COLESTID) 1 g tablet, Take 1 tablet (1 g total) by mouth 2 (two) times daily., Disp: 180 tablet, Rfl: 3   diclofenac (VOLTAREN) 75 MG EC tablet, TAKE 1 TABLET TWICE A DAY, Disp: 60 tablet, Rfl: 11   ezetimibe (ZETIA) 10 MG tablet, Take 10 mg by mouth at bedtime., Disp: , Rfl:    famotidine (PEPCID) 20 MG tablet, Take 1 tablet (20 mg total) by mouth  daily., Disp: 10 tablet, Rfl: 0   fenofibrate (TRICOR) 48 MG tablet, Take 1 tablet (48 mg total) by mouth daily., Disp:  90 tablet, Rfl: 3   losartan (COZAAR) 50 MG tablet, Take 50 mg by mouth daily., Disp: , Rfl:    pantoprazole (PROTONIX) 40 MG tablet, Take 1 tablet (40 mg total) by mouth 2 (two) times daily., Disp: 180 tablet, Rfl: 3   tamsulosin (FLOMAX) 0.4 MG CAPS capsule, Take 1 capsule (0.4 mg total) by mouth in the morning and at bedtime., Disp: 180 capsule, Rfl: 3   zolpidem (AMBIEN) 10 MG tablet, Take 1 tablet (10 mg total) by mouth at bedtime as needed for sleep., Disp: 15 tablet, Rfl: 1  No orders of the defined types were placed in this encounter.   There are no Patient Instructions on file for this visit.   --Continue cardiac medications as reconciled in final medication list. --No follow-ups on file. or sooner if needed. --Continue follow-up with your primary care physician regarding the management of your other chronic comorbid conditions.  Patient's questions and concerns were addressed to his satisfaction. He voices understanding of the instructions provided during this encounter.   This note was created using a voice recognition software as a result there may be grammatical errors inadvertently enclosed that do not reflect the nature of this encounter. Every attempt is made to correct such errors.  Tessa Lerner, [place], [facility]  Pager: [phone] Office: [phone]

## 2022-03-02 ENCOUNTER — Ambulatory Visit: Payer: Medicare HMO | Admitting: Dermatology

## 2022-05-17 HISTORY — PX: CARPAL TUNNEL RELEASE: SHX101

## 2022-08-12 ENCOUNTER — Ambulatory Visit: Payer: Medicare HMO | Admitting: Family Medicine

## 2022-09-01 ENCOUNTER — Ambulatory Visit (INDEPENDENT_AMBULATORY_CARE_PROVIDER_SITE_OTHER): Payer: Medicare HMO | Admitting: Family Medicine

## 2022-09-01 ENCOUNTER — Encounter: Payer: Self-pay | Admitting: Family Medicine

## 2022-09-01 ENCOUNTER — Ambulatory Visit: Payer: Medicare HMO | Attending: Family Medicine

## 2022-09-01 VITALS — BP 120/60 | HR 65 | Temp 98.4°F | Ht 68.0 in | Wt 190.0 lb

## 2022-09-01 DIAGNOSIS — I493 Ventricular premature depolarization: Secondary | ICD-10-CM

## 2022-09-01 DIAGNOSIS — E782 Mixed hyperlipidemia: Secondary | ICD-10-CM

## 2022-09-01 DIAGNOSIS — I499 Cardiac arrhythmia, unspecified: Secondary | ICD-10-CM

## 2022-09-01 DIAGNOSIS — I10 Essential (primary) hypertension: Secondary | ICD-10-CM

## 2022-09-01 DIAGNOSIS — Z87891 Personal history of nicotine dependence: Secondary | ICD-10-CM | POA: Diagnosis not present

## 2022-09-01 NOTE — Assessment & Plan Note (Signed)
Frequent PVCs on EKG, in and out of bigeminy, Asymptomatic. Will check CMP. Zio patch and cards referral placed. He sees Dr Odis Hollingshead regularly but next appointment not until October. Instructed to seek medical care if symptoms arise including chest pain, shortness of breath, palpitations, lightheadedness, vision changes, headaches.

## 2022-09-01 NOTE — Assessment & Plan Note (Addendum)
Chronic. Well controlled. Continue Amlodipine  daily, Losartan  daily. Denies chest pain, palpitations, shortness of breath, headaches, vision changes, swelling of extremities, seek medical care for any of above. Encouraged heart healthy diet and 150 minutes moderate intensity exercise weekly.

## 2022-09-01 NOTE — Progress Notes (Signed)
New Patient Office Visit  Subjective    Patient ID: Brett [name], male    DOB: July 21, 1945  Age: 77 y.o. MRN: [id number]  CC: No chief complaint on file.   HPI Brett [name] presents to establish care. Oriented to practice routines and expectations. PMH includes HTN, HLD, IBS, GERD, SCC, and as listed below. He sees a cardiologist and gastroenterologist. Concerns include 2-3 days nasal congestion, mucopurulent cough, clear rhinorrhea. Denies sinus headache or pressure, fever, malaise, shortness of breath. Negative covid test.  Colon CA screening: colonoscopy 10 years ago without abnormalities. Prostate CA screening: not recommended Tobacco: former smoker, quit 27 years ago Vaccines:  UTD    Outpatient Encounter Medications as of 09/01/2022  Medication Sig   amLODipine (NORVASC) 10 MG tablet TAKE 1 TABLET DAILY   colestipol (COLESTID) 1 g tablet Take 1 tablet (1 g total) by mouth 2 (two) times daily.   diclofenac (VOLTAREN) 75 MG EC tablet TAKE 1 TABLET TWICE A DAY   ezetimibe (ZETIA) 10 MG tablet Take 10 mg by mouth at bedtime.   famotidine (PEPCID) 20 MG tablet Take 1 tablet (20 mg total) by mouth daily.   fenofibrate (TRICOR) 48 MG tablet Take 1 tablet (48 mg total) by mouth daily.   losartan (COZAAR) 50 MG tablet Take 50 mg by mouth daily.   pantoprazole (PROTONIX) 40 MG tablet Take 1 tablet (40 mg total) by mouth 2 (two) times daily.   tamsulosin (FLOMAX) 0.4 MG CAPS capsule Take 1 capsule (0.4 mg total) by mouth in the morning and at bedtime.   [DISCONTINUED] zolpidem (AMBIEN) 10 MG tablet Take 1 tablet (10 mg total) by mouth at bedtime as needed for sleep.   No facility-administered encounter medications on file as of 09/01/2022.    Past Medical History:  Diagnosis Date   Allergy    seasonal   Anemia 06/2019   iron deficiency   Arthritis    Cataract    Diverticulosis    GERD (gastroesophageal reflux disease)    Hiatal hernia 2014   Dr. Elisabeth Cara [facility])    History of hiatal hernia    Hyperlipidemia    Hypertension    IBS (irritable colon syndrome)    Pneumonia    hx. of   PONV (postoperative nausea and vomiting)    with gallbladder surgery   SCC (squamous cell carcinoma) 01/02/2019   R temple-txpbx   Sleep apnea    cpap   Vertigo     Past Surgical History:  Procedure Laterality Date   CARPAL TUNNEL RELEASE Bilateral    CHOLECYSTECTOMY  2004   Post-cholecystectomy syndrome    COLONOSCOPY     ESOPHAGOGASTRODUODENOSCOPY     EYE SURGERY     cataract   FRACTURE SURGERY  2006   fx. wrist   JOINT REPLACEMENT     KNEE SURGERY Left 2020   rhino plasty     deviated septum   SHOULDER SURGERY  2011   rotator cuff   TOTAL HIP ARTHROPLASTY Right 12/13/2016   Procedure: RIGHT TOTAL HIP ARTHROPLASTY ANTERIOR APPROACH;  Surgeon: Samson Frederic, MD;  Location: [facility] OR;  Service: Orthopedics;  Laterality: Right;   TOTAL SHOULDER ARTHROPLASTY Right 04/15/2017   Procedure: RIGHT TOTAL SHOULDER ARTHROPLASTY;  Surgeon: Beverely Low, MD;  Location: [facility] OR;  Service: Orthopedics;  Laterality: Right;   TYMPANOSTOMY TUBE PLACEMENT Left 2020   UPPER GASTROINTESTINAL ENDOSCOPY     VASECTOMY  1980    Family History  Problem Relation Age  of Onset   Cancer Sister        uterine    Cancer Maternal Grandmother    Colon cancer Neg Hx    Colon polyps Neg Hx    Esophageal cancer Neg Hx    Rectal cancer Neg Hx    Stomach cancer Neg Hx     Social History   Socioeconomic History   Marital status: Married    Spouse name: Not on file   Number of children: 2   Years of education: Not on file   Highest education level: Not on file  Occupational History   Not on file  Tobacco Use   Smoking status: Former    Packs/day: 2.00    Years: 51.00    Additional pack years: 0.00    Total pack years: 102.00    Types: Cigarettes, Cigars    Start date: 1963    Quit date: 2012    Years since quitting: 12.3   Smokeless tobacco: Never  Vaping Use   Vaping  Use: Never used  Substance and Sexual Activity   Alcohol use: No   Drug use: No   Sexual activity: Yes  Other Topics Concern   Not on file  Social History Narrative   Not on file   Social Determinants of Health   Financial Resource Strain: Not on file  Food Insecurity: Not on file  Transportation Needs: Not on file  Physical Activity: Not on file  Stress: Not on file  Social Connections: Not on file  Intimate Partner Violence: Not on file    Review of Systems  Constitutional: Negative.   HENT:  Positive for congestion.   Eyes: Negative.   Respiratory:  Positive for cough.   Cardiovascular: Negative.   Gastrointestinal:  Positive for diarrhea.  Genitourinary: Negative.   Musculoskeletal: Negative.   Skin: Negative.   Neurological: Negative.   Endo/Heme/Allergies: Negative.   Psychiatric/Behavioral: Negative.    All other systems reviewed and are negative.       Objective    BP 120/60   Pulse 65   Temp 98.4 F (36.9 C) (Oral)   Ht 5\' 8"  (1.727 m)   Wt 190 lb (86.2 kg)   SpO2 95%   BMI 28.89 kg/m   Physical Exam Vitals and nursing note reviewed.  Constitutional:      Appearance: Normal appearance. He is normal weight.  HENT:     Head: Normocephalic and atraumatic.     Right Ear: Tympanic membrane, ear canal and external ear normal.     Left Ear: There is impacted cerumen.     Nose: Congestion present.     Mouth/Throat:     Mouth: Mucous membranes are moist.     Pharynx: Oropharynx is clear.  Eyes:     Extraocular Movements: Extraocular movements intact.     Right eye: Normal extraocular motion and no nystagmus.     Left eye: Normal extraocular motion and no nystagmus.     Conjunctiva/sclera: Conjunctivae normal.     Pupils: Pupils are equal, round, and reactive to light.  Cardiovascular:     Rate and Rhythm: Normal rate. Rhythm irregular.     Pulses: Normal pulses.     Heart sounds: Murmur heard.  Pulmonary:     Effort: Pulmonary effort is  normal.     Breath sounds: Normal breath sounds.  Abdominal:     General: Bowel sounds are normal.     Palpations: Abdomen is soft.  Genitourinary:  Comments: Deferred using shared decision making Musculoskeletal:        General: Normal range of motion.     Cervical back: Normal range of motion and neck supple.  Skin:    General: Skin is warm and dry.     Capillary Refill: Capillary refill takes less than 2 seconds.  Neurological:     General: No focal deficit present.     Mental Status: He is alert. Mental status is at baseline.  Psychiatric:        Mood and Affect: Mood normal.        Speech: Speech normal.        Behavior: Behavior normal.        Thought Content: Thought content normal.        Cognition and Memory: Cognition and memory normal.        Judgment: Judgment normal.         Assessment & Plan:   Problem List Items Addressed This Visit       Cardiovascular and Mediastinum   Essential (primary) hypertension - Primary    Chronic. Well controlled. Continue Amlodipine 10mg  daily, Losartan 50mg  daily. Denies chest pain, palpitations, shortness of breath, headaches, vision changes, swelling of extremities, seek medical care for any of above. Encouraged heart healthy diet and 150 minutes moderate intensity exercise weekly.      Relevant Orders   CBC with Differential/Platelet   COMPLETE METABOLIC PANEL WITH GFR   Lipid panel   Frequent unifocal PVCs    Frequent PVCs on EKG, in and out of bigeminy, Asymptomatic. Will check CMP. Zio patch and cards referral placed. He sees Dr Odis Hollingshead regularly but next appointment not until October. Instructed to seek medical care if symptoms arise including chest pain, shortness of breath, palpitations, lightheadedness, vision changes, headaches.      Relevant Orders   LONG TERM MONITOR (3-14 DAYS)   Ambulatory referral to Cardiology     Other   Mixed hyperlipidemia   Relevant Orders   CBC with Differential/Platelet    COMPLETE METABOLIC PANEL WITH GFR   Lipid panel   Other Visit Diagnoses     Former smoker       Relevant Orders   CT CHEST LUNG CANCER SCREENING LOW DOSE WO CONTRAST   Irregular heart beats       Relevant Orders   EKG 12-Lead (Completed)       Return in about 6 months (around 03/03/2023).   Park Meo, FNP

## 2022-09-01 NOTE — Progress Notes (Unsigned)
Enrolled for [employer] to mail a ZIO XT long term holter monitor to the patients address on file.   DOD to read. 

## 2022-09-02 ENCOUNTER — Other Ambulatory Visit: Payer: Medicare HMO

## 2022-09-03 LAB — LIPID PANEL
Cholesterol: 162 mg/dL (ref <200)
HDL: 33 mg/dL — ABNORMAL LOW (ref >=40)
LDL Cholesterol (Calc): 104 mg/dL (calc) — ABNORMAL HIGH
Non-HDL Cholesterol (Calc): 129 mg/dL (calc) (ref <130)
Total CHOL/HDL Ratio: 4.9 (calc) (ref <5.0)
Triglycerides: 148 mg/dL (ref <150)

## 2022-09-03 LAB — COMPLETE METABOLIC PANEL WITH GFR
AG Ratio: 1.5 (calc) (ref 1.0–2.5)
ALT: 8 U/L — ABNORMAL LOW (ref 9–46)
AST: 11 U/L (ref 10–35)
Albumin: 4 g/dL (ref 3.6–5.1)
Alkaline phosphatase (APISO): 43 U/L (ref 35–144)
BUN: 12 mg/dL (ref 7–25)
CO2: 30 mmol/L (ref 20–32)
Calcium: 9.2 mg/dL (ref 8.6–10.3)
Chloride: 104 mmol/L (ref 98–110)
Creat: 1.12 mg/dL (ref 0.70–1.28)
Globulin: 2.7 g/dL (calc) (ref 1.9–3.7)
Glucose, Bld: 100 mg/dL — ABNORMAL HIGH (ref 65–99)
Potassium: 4.2 mmol/L (ref 3.5–5.3)
Sodium: 141 mmol/L (ref 135–146)
Total Bilirubin: 0.5 mg/dL (ref 0.2–1.2)
Total Protein: 6.7 g/dL (ref 6.1–8.1)
eGFR: 69 mL/min/{1.73_m2} (ref >=60)

## 2022-09-03 LAB — CBC WITH DIFFERENTIAL/PLATELET
Absolute Monocytes: 697 cells/uL (ref 200–950)
Basophils Absolute: 81 cells/uL (ref 0–200)
Basophils Relative: 1 %
Eosinophils Absolute: 591 cells/uL — ABNORMAL HIGH (ref 15–500)
Eosinophils Relative: 7.3 %
HCT: 41.5 % (ref 38.5–50.0)
Hemoglobin: 13.4 g/dL (ref 13.2–17.1)
Lymphs Abs: 1742 cells/uL (ref 850–3900)
MCH: 27.9 pg (ref 27.0–33.0)
MCHC: 32.3 g/dL (ref 32.0–36.0)
MCV: 86.3 fL (ref 80.0–100.0)
MPV: 9.5 fL (ref 7.5–12.5)
Monocytes Relative: 8.6 %
Neutro Abs: 4990 cells/uL (ref 1500–7800)
Neutrophils Relative %: 61.6 %
Platelets: 270 10*3/uL (ref 140–400)
RBC: 4.81 10*6/uL (ref 4.20–5.80)
RDW: 12.9 % (ref 11.0–15.0)
Total Lymphocyte: 21.5 %
WBC: 8.1 10*3/uL (ref 3.8–10.8)

## 2022-09-04 DIAGNOSIS — I493 Ventricular premature depolarization: Secondary | ICD-10-CM

## 2022-09-08 ENCOUNTER — Other Ambulatory Visit: Payer: Self-pay

## 2022-09-08 ENCOUNTER — Other Ambulatory Visit: Payer: Self-pay | Admitting: Family Medicine

## 2022-09-08 MED ORDER — REPATHA SURECLICK 140 MG/ML ~~LOC~~ SOAJ: 140.0000 mg | SUBCUTANEOUS | 2 refills | Status: DC

## 2022-09-09 ENCOUNTER — Other Ambulatory Visit: Payer: Self-pay | Admitting: Family Medicine

## 2022-09-13 DIAGNOSIS — Z0000 Encounter for general adult medical examination without abnormal findings: Secondary | ICD-10-CM | POA: Insufficient documentation

## 2022-09-13 DIAGNOSIS — Z460 Encounter for fitting and adjustment of spectacles and contact lenses: Secondary | ICD-10-CM | POA: Insufficient documentation

## 2022-09-13 DIAGNOSIS — G4733 Obstructive sleep apnea (adult) (pediatric): Secondary | ICD-10-CM | POA: Insufficient documentation

## 2022-09-13 DIAGNOSIS — H349 Unspecified retinal vascular occlusion: Secondary | ICD-10-CM | POA: Insufficient documentation

## 2022-09-13 DIAGNOSIS — Z008 Encounter for other general examination: Secondary | ICD-10-CM | POA: Insufficient documentation

## 2022-09-13 DIAGNOSIS — Z961 Presence of intraocular lens: Secondary | ICD-10-CM | POA: Insufficient documentation

## 2022-09-19 ENCOUNTER — Encounter: Payer: Self-pay | Admitting: Family Medicine

## 2022-09-20 ENCOUNTER — Other Ambulatory Visit: Payer: Self-pay | Admitting: Family Medicine

## 2022-09-20 MED ORDER — TAMSULOSIN HCL 0.4 MG PO CAPS: 0.4000 mg | ORAL_CAPSULE | Freq: Every day | ORAL | 3 refills | Status: DC

## 2022-09-24 ENCOUNTER — Telehealth: Payer: Self-pay

## 2022-09-24 ENCOUNTER — Other Ambulatory Visit: Payer: Self-pay

## 2022-09-24 ENCOUNTER — Ambulatory Visit: Payer: Medicare HMO | Admitting: Cardiology

## 2022-09-24 ENCOUNTER — Encounter: Payer: Self-pay | Admitting: Cardiology

## 2022-09-24 ENCOUNTER — Ambulatory Visit: Payer: Medicare HMO | Admitting: Gastroenterology

## 2022-09-24 VITALS — BP 138/73 | HR 62 | Ht 68.0 in | Wt 191.0 lb

## 2022-09-24 DIAGNOSIS — E781 Pure hyperglyceridemia: Secondary | ICD-10-CM

## 2022-09-24 DIAGNOSIS — E7800 Pure hypercholesterolemia, unspecified: Secondary | ICD-10-CM

## 2022-09-24 DIAGNOSIS — I340 Nonrheumatic mitral (valve) insufficiency: Secondary | ICD-10-CM

## 2022-09-24 DIAGNOSIS — I10 Essential (primary) hypertension: Secondary | ICD-10-CM

## 2022-09-24 DIAGNOSIS — I493 Ventricular premature depolarization: Secondary | ICD-10-CM

## 2022-09-24 DIAGNOSIS — Z87891 Personal history of nicotine dependence: Secondary | ICD-10-CM

## 2022-09-24 DIAGNOSIS — G4733 Obstructive sleep apnea (adult) (pediatric): Secondary | ICD-10-CM

## 2022-09-24 DIAGNOSIS — Z789 Other specified health status: Secondary | ICD-10-CM

## 2022-09-24 DIAGNOSIS — I700 Atherosclerosis of aorta: Secondary | ICD-10-CM

## 2022-09-24 MED ORDER — DILTIAZEM HCL ER COATED BEADS 180 MG PO CP24
180.0000 mg | ORAL_CAPSULE | Freq: Every day | ORAL | 0 refills | Status: DC
Start: 2022-09-24 — End: 2022-11-30

## 2022-09-24 NOTE — Telephone Encounter (Signed)
Okay. TY.  Brett Steinruck [place], DO, [facility]

## 2022-09-24 NOTE — Telephone Encounter (Signed)
Patient called to let know he is taking Zetia 10mg .

## 2022-09-24 NOTE — Progress Notes (Signed)
ID:  Brett [name], DOB 1946-01-20, MRN [id number]  PCP:  Park Meo, FNP  Cardiologist:  Tessa Lerner, DO, [facility] (established care 12/23/2021).  Date: 09/24/22 Last Office Visit: 02/22/2022  Chief Complaint  Patient presents with   pvc    HPI  Brett [name] is a 77 y.o. Caucasian male whose past medical history and cardiovascular risk factors include: Mitral regurgitation, hyperlipidemia, statin intolerance, GERD, hypertension, hiatal hernia, iron deficiency anemia, former smoker, hypertriglyceridemia, OSA and CPAP.  Initially referred to the practice for evaluation of a cardiac murmur which was auscultated during annual well visit with PCP.  Follow-up echocardiography notes presence of mitral regurgitation.  Recently patient followed up with PCP and was noted to have underlying sinus rhythm with frequent PVCs in a bigeminal pattern.  He underwent Zio patch which was interpreted by different provider results reviewed at today's visit.  PVC burden on current monitor 3.8%.  Clinically he denies anginal chest pain, heart failure symptoms, near-syncope, syncope.  Given his risk factors, elevated 10-year risk of ASCVD, and recent lipid profile patient was requested to be on Repatha by PCP; however, patient wants to avoid injectable medications if possible.  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  ALLERGIES: Allergies  Allergen Reactions   Other     RSD   Gabapentin (Once-Daily)     Nausea    Statins     myalgias   Tramadol Nausea Only    MEDICATION LIST PRIOR TO VISIT: Current Meds  Medication Sig   colestipol (COLESTID) 1 g tablet Take 1 tablet (1 g total) by mouth 2 (two) times daily.   diclofenac (VOLTAREN) 75 MG EC tablet TAKE 1 TABLET TWICE A DAY (Patient taking differently: 75 mg as needed.)   diltiazem (CARDIZEM CD) 180 MG 24 hr capsule Take 1 capsule (180 mg total) by mouth daily.   ezetimibe (ZETIA) 10 MG tablet Take 10 mg by  mouth at bedtime.   famotidine (PEPCID) 20 MG tablet Take 1 tablet (20 mg total) by mouth daily.   fenofibrate (TRICOR) 48 MG tablet Take 1 tablet (48 mg total) by mouth daily.   losartan (COZAAR) 50 MG tablet Take 50 mg by mouth daily.   pantoprazole (PROTONIX) 40 MG tablet Take 1 tablet (40 mg total) by mouth 2 (two) times daily.   tamsulosin (FLOMAX) 0.4 MG CAPS capsule Take 1 capsule (0.4 mg total) by mouth daily.   [DISCONTINUED] amLODipine (NORVASC) 10 MG tablet TAKE 1 TABLET DAILY     PAST MEDICAL HISTORY: Past Medical History:  Diagnosis Date   Allergy    seasonal   Anemia 06/2019   iron deficiency   Arthritis    Cataract    Diverticulosis    GERD (gastroesophageal reflux disease)    Hiatal hernia 2014   Dr. Elisabeth Cara [facility])   History of hiatal hernia    Hyperlipidemia    Hypertension    IBS (irritable colon syndrome)    Pneumonia    hx. of   PONV (postoperative nausea and vomiting)    with gallbladder surgery   SCC (squamous cell carcinoma) 01/02/2019   R temple-txpbx   Sleep apnea    cpap   Vertigo     PAST SURGICAL HISTORY: Past Surgical History:  Procedure Laterality Date   CARPAL TUNNEL RELEASE Bilateral 05/2022   CHOLECYSTECTOMY  2004   Post-cholecystectomy syndrome    COLONOSCOPY     ESOPHAGOGASTRODUODENOSCOPY     EYE SURGERY  cataract   FRACTURE SURGERY  2006   fx. wrist   JOINT REPLACEMENT     KNEE SURGERY Left 2020   rhino plasty     deviated septum   SHOULDER SURGERY  2011   rotator cuff   TOTAL HIP ARTHROPLASTY Right 12/13/2016   Procedure: RIGHT TOTAL HIP ARTHROPLASTY ANTERIOR APPROACH;  Surgeon: Samson Frederic, MD;  Location: [facility] OR;  Service: Orthopedics;  Laterality: Right;   TOTAL SHOULDER ARTHROPLASTY Right 04/15/2017   Procedure: RIGHT TOTAL SHOULDER ARTHROPLASTY;  Surgeon: Beverely Low, MD;  Location: [facility] OR;  Service: Orthopedics;  Laterality: Right;   TYMPANOSTOMY TUBE PLACEMENT Left 2020   UPPER GASTROINTESTINAL ENDOSCOPY      VASECTOMY  1980    FAMILY HISTORY: The patient family history includes Cancer in his maternal grandmother and sister.  SOCIAL HISTORY:  The patient  reports that he quit smoking about 12 years ago. His smoking use included cigarettes and cigars. He started smoking about 61 years ago. He has a 102.00 pack-year smoking history. He has never used smokeless tobacco. He reports that he does not drink alcohol and does not use drugs.  REVIEW OF SYSTEMS: Review of Systems  Cardiovascular:  Negative for chest pain, claudication, dyspnea on exertion, irregular heartbeat, leg swelling, near-syncope, orthopnea, palpitations, paroxysmal nocturnal dyspnea and syncope.  Respiratory:  Negative for shortness of breath.   Hematologic/Lymphatic: Negative for bleeding problem.  Musculoskeletal:  Negative for muscle cramps and myalgias.  Neurological:  Negative for dizziness and light-headedness.    PHYSICAL EXAM:    09/24/2022    2:02 PM 09/01/2022    2:59 PM 02/22/2022    9:38 AM  Vitals with BMI  Height 5\' 8"  5\' 8"  5\' 8"   Weight 191 lbs 190 lbs 190 lbs  BMI 29.05 28.9 28.9  Systolic 138 120 629  Diastolic 73 60 60  Pulse 62 65 62   Physical Exam  Constitutional: No distress.  Age appropriate, hemodynamically stable.   HENT:  Frank-sign  Neck: No JVD present.  Cardiovascular: Normal rate, regular rhythm, S1 normal, S2 normal, intact distal pulses and normal pulses. Exam reveals no gallop, no S3 and no S4.  Murmur heard. Holosystolic murmur is present with a grade of 3/6 at the apex. Pulses:      Dorsalis pedis pulses are 2+ on the right side and 2+ on the left side.       Posterior tibial pulses are 2+ on the right side and 2+ on the left side.  Pulmonary/Chest: Effort normal and breath sounds normal. No stridor. He has no wheezes. He has no rales.  Abdominal: Soft. Bowel sounds are normal. He exhibits no distension. There is no abdominal tenderness.  Musculoskeletal:        General: No  edema.     Cervical back: Neck supple.  Neurological: He is alert and oriented to person, place, and time. He has intact cranial nerves (2-12).  Skin: Skin is warm and moist.   CARDIAC DATABASE: EKG: September 01, 2022: Sinus rhythm, 74 bpm, frequent PVCs and ventricular bigeminy pattern. Sep 24, 2022: Sinus bradycardia, 56 bpm, without underlying ischemia injury pattern.  Echocardiogram: 01/29/2022: Normal LV systolic function with visual EF 60-65%. Left ventricle cavity is normal in size. Normal left ventricular wall thickness. Normal global wall motion. Doppler evidence of grade I (impaired) diastolic dysfunction, normal LAP. Calculated EF 64%. Trace aortic regurgitation. Moderate (Grade II) mitral regurgitation. Mild tricuspid regurgitation. No evidence of pulmonary hypertension. IVC is dilated with respiratory  variation.   Stress Testing: Exercise treadmill stress test 02/12/2022: Exercise treadmill stress test performed using Bruce protocol.  Patient reached 8.8 METS, and 87% of age predicted maximum heart rate.  Exercise capacity was good.  No chest pain reported.  Normal heart rate and hemodynamic response. Stress EKG revealed no ischemic changes. Low risk study.  Heart Catheterization: None  Zio patch April 2024   Predominant rhythm was normal sinus rhythm with average heart rate 66bpm and ranged from 47 to 141bpm.   Rare PCAs, atrial couplets and atrial triplets   Occasional PVCs (PVC load 3.8%).  rare ventricular couplets, ventricular bigeminy and trigeminy   Nonsustained atrial tachycardia up to 11.4 seconds with fastest heart rate 190bpm  LABORATORY DATA:    Latest Ref Rng & Units 09/02/2022    8:41 AM 01/12/2021    9:07 AM 10/06/2020    9:59 AM  CBC  WBC 3.8 - 10.8 Thousand/uL 8.1  7.0  11.5   Hemoglobin 13.2 - 17.1 g/dL 35.0  09.3  81.8   Hematocrit 38.5 - 50.0 % 41.5  41.9  42.5   Platelets 140 - 400 Thousand/uL 270  307  391        Latest Ref Rng & Units  09/02/2022    8:41 AM 01/29/2021    8:35 AM 01/12/2021    9:07 AM  CMP  Glucose 65 - 99 mg/dL 299  98  96   BUN 7 - 25 mg/dL 12  11  10    Creatinine 0.70 - 1.28 mg/dL 3.71  6.96  7.89   Sodium 135 - 146 mmol/L 141  139  136   Potassium 3.5 - 5.3 mmol/L 4.2  4.4  4.1   Chloride 98 - 110 mmol/L 104  102  104   CO2 20 - 32 mmol/L 30  31  27    Calcium 8.6 - 10.3 mg/dL 9.2  9.1  8.7   Total Protein 6.1 - 8.1 g/dL 6.7     Total Bilirubin 0.2 - 1.2 mg/dL 0.5     AST 10 - 35 U/L 11     ALT 9 - 46 U/L 8       Lipid Panel  Lab Results  Component Value Date   CHOL 162 09/02/2022   HDL 33 (L) 09/02/2022   LDLCALC 104 (H) 09/02/2022   TRIG 148 09/02/2022   CHOLHDL 4.9 09/02/2022   No components found for: "NTPROBNP" No results for input(s): "PROBNP" in the last 8760 hours. No results for input(s): "TSH" in the last 8760 hours.  BMP Recent Labs    09/02/22 0841  NA 141  K 4.2  CL 104  CO2 30  GLUCOSE 100*  BUN 12  CREATININE 1.12  CALCIUM 9.2     HEMOGLOBIN A1C Lab Results  Component Value Date   HGBA1C 5.8 (H) 01/29/2021   MPG 120 01/29/2021    IMPRESSION:    ICD-10-CM   1. Premature ventricular complex  I49.3 EKG 12-Lead    diltiazem (CARDIZEM CD) 180 MG 24 hr capsule    2. Nonrheumatic mitral valve regurgitation  I34.0     3. Essential (primary) hypertension  I10     4. Mixed hyperlipidemia  E78.2     5. OSA on CPAP  G47.33     6. Aortic atherosclerosis (HCC)  I70.0 CT CARDIAC SCORING (SELF PAY ONLY)    7. Hypertriglyceridemia  E78.1 CT CARDIAC SCORING (SELF PAY ONLY)    8. Statin intolerance  Z78.9  9. Former smoker  Z87.891     10. Pure hypercholesterolemia  E78.00 CT CARDIAC SCORING (SELF PAY ONLY)       RECOMMENDATIONS: Brett [name] is a 77 y.o. Caucasian male whose past medical history and cardiac risk factors include: Hyperlipidemia, statin intolerance, GERD, hypertension, hiatal hernia, iron deficiency anemia, former smoker,  hypertriglyceridemia, OSA and CPAP.  Premature ventricular complex Noted on EKG with PCP in April 2024. Subsequently underwent Zio patch which noted a PVC burden of approximately 3.8%. Will discontinue amlodipine. Start Cardizem 180 mg p.o. daily. I have asked him to keep a close eye on his blood pressure due to medication changes.  Nonrheumatic mitral valve regurgitation Asymptomatic. Initially noted on physical examination. Most recent echocardiogram notes moderate MR-follow-up echo in September 2024. Reemphasized the importance of blood pressure management. Monitor for now  Essential (primary) hypertension Office blood pressures within acceptable limits. Changes as noted above.  Pure hypercholesterolemia Statin intolerance Hypertriglyceridemia Mixed hyperlipidemia Aortic atherosclerosis (HCC) Currently on fenofibrate and Zetia. History of statin intolerance. Discussed nonstatin options such as Nexletol, Nexlizet, PCSK9 inhibitors. Most recent lipid profile from April 2024 reviewed. Will proceed with coronary artery calcification for further risk stratification.  Based on the numbers would consider transitioning Zetia to next visit.  He would like to avoid injectable medications if possible   FINAL MEDICATION LIST END OF ENCOUNTER: Meds ordered this encounter  Medications   diltiazem (CARDIZEM CD) 180 MG 24 hr capsule    Sig: Take 1 capsule (180 mg total) by mouth daily.    Dispense:  90 capsule    Refill:  0    Medications Discontinued During This Encounter  Medication Reason   amLODipine (NORVASC) 10 MG tablet       Current Outpatient Medications:    colestipol (COLESTID) 1 g tablet, Take 1 tablet (1 g total) by mouth 2 (two) times daily., Disp: 180 tablet, Rfl: 3   diclofenac (VOLTAREN) 75 MG EC tablet, TAKE 1 TABLET TWICE A DAY (Patient taking differently: 75 mg as needed.), Disp: 60 tablet, Rfl: 11   diltiazem (CARDIZEM CD) 180 MG 24 hr capsule, Take 1 capsule  (180 mg total) by mouth daily., Disp: 90 capsule, Rfl: 0   ezetimibe (ZETIA) 10 MG tablet, Take 10 mg by mouth at bedtime., Disp: , Rfl:    famotidine (PEPCID) 20 MG tablet, Take 1 tablet (20 mg total) by mouth daily., Disp: 10 tablet, Rfl: 0   fenofibrate (TRICOR) 48 MG tablet, Take 1 tablet (48 mg total) by mouth daily., Disp: 90 tablet, Rfl: 3   losartan (COZAAR) 50 MG tablet, Take 50 mg by mouth daily., Disp: , Rfl:    pantoprazole (PROTONIX) 40 MG tablet, Take 1 tablet (40 mg total) by mouth 2 (two) times daily., Disp: 180 tablet, Rfl: 3   tamsulosin (FLOMAX) 0.4 MG CAPS capsule, Take 1 capsule (0.4 mg total) by mouth daily., Disp: 180 capsule, Rfl: 3  Orders Placed This Encounter  Procedures   CT CARDIAC SCORING (SELF PAY ONLY)   EKG 12-Lead    There are no Patient Instructions on file for this visit.   --Continue cardiac medications as reconciled in final medication list. --Return in about 5 months (around 02/14/2023) for Follow up MR and CAC report. or sooner if needed. --Continue follow-up with your primary care physician regarding the management of your other chronic comorbid conditions.  Patient's questions and concerns were addressed to his satisfaction. He voices understanding of the instructions provided during this encounter.  This note was created using a voice recognition software as a result there may be grammatical errors inadvertently enclosed that do not reflect the nature of this encounter. Every attempt is made to correct such errors.  Tessa Lerner, [place], [facility]  Pager:  [phone] Office: [phone]

## 2022-09-29 ENCOUNTER — Telehealth: Payer: Self-pay | Admitting: Family Medicine

## 2022-09-29 NOTE — Telephone Encounter (Signed)
Contacted Mcarthur Rossetti to schedule their annual wellness visit. Appointment made for 10/07/2022.  Thank you,  [facility] Support [facility] Direct dial  [phone]

## 2022-09-30 ENCOUNTER — Encounter: Payer: Self-pay | Admitting: Gastroenterology

## 2022-09-30 ENCOUNTER — Ambulatory Visit (INDEPENDENT_AMBULATORY_CARE_PROVIDER_SITE_OTHER): Payer: Medicare HMO | Admitting: Gastroenterology

## 2022-09-30 VITALS — BP 120/54 | HR 84 | Ht 66.75 in | Wt 190.1 lb

## 2022-09-30 DIAGNOSIS — K915 Postcholecystectomy syndrome: Secondary | ICD-10-CM

## 2022-09-30 DIAGNOSIS — K219 Gastro-esophageal reflux disease without esophagitis: Secondary | ICD-10-CM | POA: Diagnosis not present

## 2022-09-30 DIAGNOSIS — Z79899 Other long term (current) drug therapy: Secondary | ICD-10-CM | POA: Diagnosis not present

## 2022-09-30 MED ORDER — BACLOFEN 10 MG PO TABS: 10.0000 mg | ORAL_TABLET | Freq: Every day | ORAL | 1 refills | Status: DC

## 2022-09-30 MED ORDER — PANTOPRAZOLE SODIUM 40 MG PO TBEC: 40.0000 mg | DELAYED_RELEASE_TABLET | Freq: Two times a day (BID) | ORAL | 3 refills | Status: DC

## 2022-09-30 NOTE — Patient Instructions (Addendum)
_______________________________________________________  If your blood pressure at your visit was 140/90 or greater, please contact your primary care physician to follow up on this.  _______________________________________________________  If you are age 75 or older, your body mass index should be between 23-30. Your Body mass index is 30 kg/m. If this is out of the aforementioned range listed, please consider follow up with your Primary Care Provider.  If you are age 23 or younger, your body mass index should be between 19-25. Your Body mass index is 30 kg/m. If this is out of the aformentioned range listed, please consider follow up with your Primary Care Provider.  ________________________________________________________  The [facility] providers would like to encourage you to use [facility] to communicate with providers for non-urgent requests or questions.  Due to long hold times on the telephone, sending your provider a message by [facility] may be a faster and more efficient way to get a response.  Please allow 48 business hours for a response.  Please remember that this is for non-urgent requests.  _______________________________________________________  We have sent the following medications to your pharmacy for you to pick up at your convenience: Protonix 40 mg : Take twice daily Baclofen 10 mg: Take 1 to 2 tablets daily at bedtime  Follow up in 1 year.  Thank you for entrusting me with your care and for choosing [facility], Dr. Ileene Patrick

## 2022-09-30 NOTE — Progress Notes (Signed)
HPI :  77 year old male with a history of GERD, IDA, postcholecystectomy related loose stools, here to reestablish care.  He was last seen on Sep 25, 2019.  Recall he has a history of longstanding GERD.  He has been on longstanding PPI now for years.  He is currently taking Protonix 40 mg twice daily.  He states this generally works pretty well for him.  Previously he was having symptoms on this we try to get him Dexilant but that was too expensive.  He may have some breakthrough about 2 days/week for which she needs to take Tums.  Typically this can occur at night when he is lying down.  He does not have much daytime symptoms that bother him.  He denies any dysphagia.  No nausea or vomiting.  No abdominal pains.  He had an EGD in 2021 for iron deficiency and had a 1 cm hiatal hernia, no Barrett's esophagus.  We discussed his regimen and, long-term risks of PPI and how he wanted to proceed moving forward.  Otherwise his EGD showed some gastritis, he was on diclofenac which thought that could potentially be related to his IDA.  His colonoscopy was markable for diverticulosis and hemorrhoids.  No polyps   He otherwise has continued Colestid twice daily for some time now.  This works really well to control his loose stools postcholecystectomy.  He feels the regimen works well for him and is quite happy with it.  No unintentional weight loss.  He is otherwise feels well without complaints today.    Previous Endoscopic Evaluations: EGD 08/09/19 EGD for IDA and loose stool  -esophagogastric landmarks identified. - 1 cm hiatal hernia. - Normal esophagus otherwise - Two small benign gastric polyps. Resected and retrieved. - Gastritis. - Normal stomach otherwise - biopsies taken to rule out H pylori - Normal duodenal bulb and second portion of the duodenum. Biopsied   Colonoscopy 08/09/2019 for IDA and loose stool -Complete exam, good prep -Diverticulosis and internal hemorrhoids   Surgical [P],  duodenal bulb - BENIGN SMALL BOWEL MUCOSA. - NO VILLOUS BLUNTING OR INCREASE IN INTRAEPITHELIAL LYMPHOCYTES. - NO DYSPLASIA OR MALIGNANCY. 2. Surgical [P], gastric antrum - MILD REACTIVE GASTROPATHY. [place] IS NEGATIVE FOR HELICOBACTER PYLORI. - NO INTESTINAL METAPLASIA, DYSPLASIA, OR MALIGNANCY. 3. Surgical [P], gastric antrum, polyp - HYPERPLASTIC POLYP. - [facility]-STARRY IS NEGATIVE FOR HELICOBACTER PYLORI. - NO INTESTINAL METAPLASIA, DYSPLASIA, OR MALIGNANCY. 4. Surgical [P], random sites - BENIGN COLONIC MUCOSA. - NO ACTIVE INFLAMMATION OR EVIDENCE OF MICROSCOPIC COLITIS. - NO DYSPLASIA OR MALIGNANCY.     Past Medical History:  Diagnosis Date   Allergy    seasonal   Anemia 06/2019   iron deficiency   Arthritis    Cataract    Diverticulosis    GERD (gastroesophageal reflux disease)    Heart murmur    Hiatal hernia 2014   Dr. Elisabeth Cara [facility])   History of hiatal hernia    Hyperlipidemia    Hypertension    IBS (irritable colon syndrome)    Irregular heart beat    Pneumonia    hx. of   PONV (postoperative nausea and vomiting)    with gallbladder surgery   SCC (squamous cell carcinoma) 01/02/2019   R temple-[id number]   Sleep apnea    cpap   Vertigo      Past Surgical History:  Procedure Laterality Date   CARPAL TUNNEL RELEASE Bilateral 05/2022   CHOLECYSTECTOMY  2004   Post-cholecystectomy syndrome    COLONOSCOPY  ESOPHAGOGASTRODUODENOSCOPY     EYE SURGERY     cataract   FRACTURE SURGERY  2006   fx. wrist   JOINT REPLACEMENT     KNEE SURGERY Left 2020   LUMBAR DISC SURGERY     L4-L5   rhino plasty     deviated septum   SHOULDER SURGERY  2011   rotator cuff   TOTAL HIP ARTHROPLASTY Right 12/13/2016   Procedure: RIGHT TOTAL HIP ARTHROPLASTY ANTERIOR APPROACH;  Surgeon: Samson Frederic, MD;  Location: [facility] OR;  Service: Orthopedics;  Laterality: Right;   TOTAL SHOULDER ARTHROPLASTY Right 04/15/2017   Procedure: RIGHT TOTAL SHOULDER ARTHROPLASTY;   Surgeon: Beverely Low, MD;  Location: [facility] OR;  Service: Orthopedics;  Laterality: Right;   TYMPANOSTOMY TUBE PLACEMENT Left 2020   UPPER GASTROINTESTINAL ENDOSCOPY     VASECTOMY  1980   Family History  Problem Relation Age of Onset   Cancer Sister        uterine    Cancer Maternal Grandmother    Colon cancer Neg Hx    Colon polyps Neg Hx    Esophageal cancer Neg Hx    Rectal cancer Neg Hx    Stomach cancer Neg Hx    Social History   Tobacco Use   Smoking status: Former    Packs/day: 2.00    Years: 51.00    Additional pack years: 0.00    Total pack years: 102.00    Types: Cigarettes, Cigars    Start date: 1963    Quit date: 2012    Years since quitting: 12.3   Smokeless tobacco: Never  Vaping Use   Vaping Use: Never used  Substance Use Topics   Alcohol use: No   Drug use: No   Current Outpatient Medications  Medication Sig Dispense Refill   colestipol (COLESTID) 1 g tablet Take 1 tablet (1 g total) by mouth 2 (two) times daily. 180 tablet 3   diclofenac (VOLTAREN) 75 MG EC tablet TAKE 1 TABLET TWICE A DAY (Patient taking differently: 75 mg as needed.) 60 tablet 11   diltiazem (CARDIZEM CD) 180 MG 24 hr capsule Take 1 capsule (180 mg total) by mouth daily. 90 capsule 0   ezetimibe (ZETIA) 10 MG tablet Take 10 mg by mouth at bedtime.     famotidine (PEPCID) 20 MG tablet Take 1 tablet (20 mg total) by mouth daily. 10 tablet 0   fenofibrate (TRICOR) 48 MG tablet Take 1 tablet (48 mg total) by mouth daily. 90 tablet 3   losartan (COZAAR) 50 MG tablet Take 50 mg by mouth daily.     pantoprazole (PROTONIX) 40 MG tablet Take 1 tablet (40 mg total) by mouth 2 (two) times daily. 180 tablet 3   tamsulosin (FLOMAX) 0.4 MG CAPS capsule Take 1 capsule (0.4 mg total) by mouth daily. 180 capsule 3   No current facility-administered medications for this visit.   Allergies  Allergen Reactions   Other     RSD   Gabapentin (Once-Daily)     Nausea    Statins     myalgias    Tramadol Nausea Only     Review of Systems: All systems reviewed and negative except where noted in HPI.   Lab Results  Component Value Date   WBC 8.1 09/02/2022   HGB 13.4 09/02/2022   HCT 41.5 09/02/2022   MCV 86.3 09/02/2022   PLT 270 09/02/2022    Lab Results  Component Value Date   IRON 97 10/31/2019  FERRITIN 31.5 10/31/2019    Lab Results  Component Value Date   ALT 8 (L) 09/02/2022   AST 11 09/02/2022   ALKPHOS 49 09/20/2016   BILITOT 0.5 09/02/2022      Physical Exam: BP (!) 120/54 (BP Location: Left Arm, Patient Position: Sitting, Cuff Size: Normal)   Pulse 84   Ht 5' 6.75" (1.695 m) Comment: height measured without shoes  Wt 190 lb 2 oz (86.2 kg)   BMI 30.00 kg/m  Constitutional: Pleasant,well-developed, male in no acute distress. HEENT: Normocephalic and atraumatic. Conjunctivae are normal. No scleral icterus. Neck supple.  Cardiovascular: Normal rate, regular rhythm.  Pulmonary/chest: Effort normal and breath sounds normal. Abdominal: Soft, nondistended, nontender.  There are no masses palpable. Extremities: no edema Lymphadenopathy: No cervical adenopathy noted. Neurological: Alert and oriented to person place and time. Skin: Skin is warm and dry. No rashes noted. Psychiatric: Normal mood and affect. Behavior is normal.   ASSESSMENT: 77 y.o. male here for assessment of the following  1. Gastroesophageal reflux disease, unspecified whether esophagitis present   2. Long-term current use of proton pump inhibitor therapy   3. Post-cholecystectomy syndrome    Longstanding GERD, no history of Barrett's.  He has been on twice daily PPI for some time, we discussed risks of chronic PPI use to include CKD, increased risk for bone fracture, C. difficile etc.  Long-term want to use the lowest dose of PPI needed to control symptoms.  Unfortunately he requires a fairly high dose to control his symptoms, in fact having some breakthrough that really bothers  him at night.  We discussed options.  I think a trial of baclofen nightly is reasonable given his predominant nocturnal symptoms that bother him.  He can try taking this 10 mg, 1-2 tabs nightly if he tolerates it.  Counseled him on side effects, risk of sedation etc.  If this works for him, potentially he could decrease his Protonix to 40 mg once daily.  Will see how he does with this.  He can also use Gaviscon as needed for breakthrough. We discussed other options in case baclofen does not help and he continues to have significant nocturnal symptoms.  He could be a candidate for TIF if he wanted to pursue other options or come off PPI.  We discussed this briefly, he is not really interested in considering TIF right now.  Otherwise we will continue Colestid twice daily.  This is really helped his bowel function, last colonoscopy as above up-to-date.  I do not think he warrants any further colonoscopies moving forward given his age and lack of polyps on the last exam.  PLAN: - continue protonix. Discussed long term risks / benefits. Long term use lowest dose needed but given breatkthrough at night hard to reduce dose now, will continue protonix 40mg  BID - add baclofen 10mg  - 1-2 tabs q HS for nocturnal symptoms - discussed TIF - he understands, may be a good candidate, but prefers medications for now - can add gaviscon or TUMS PRN - continue colestid BID - no further colonoscopy screening exams needed - f/u 1 year  Harlin Rain, MD [facility]  CC: Lewis Moccasin, MD

## 2022-10-01 ENCOUNTER — Other Ambulatory Visit: Payer: Medicare HMO

## 2022-10-07 ENCOUNTER — Ambulatory Visit (INDEPENDENT_AMBULATORY_CARE_PROVIDER_SITE_OTHER): Payer: Medicare HMO

## 2022-10-07 VITALS — Ht 66.75 in | Wt 190.0 lb

## 2022-10-07 DIAGNOSIS — Z0000 Encounter for general adult medical examination without abnormal findings: Secondary | ICD-10-CM

## 2022-10-07 NOTE — Patient Instructions (Signed)
[name] , Thank you for taking time to come for your Medicare Wellness Visit. I appreciate your ongoing commitment to your health goals. Please review the following plan we discussed and let me know if I can assist you in the future.   These are the goals we discussed:  Goals      Remain active and independent        This is a list of the screening recommended for you and due dates:  Health Maintenance  Topic Date Due   Screening for Lung Cancer  12/25/2016   COVID-19 Vaccine (7 - 2023-24 season) 04/05/2022   Flu Shot  12/16/2022   Medicare Annual Wellness Visit  10/07/2023   Colon Cancer Screening  08/08/2029   DTaP/Tdap/Td vaccine (3 - Td or Tdap) 07/13/2032   Pneumonia Vaccine  Completed   Hepatitis C Screening  Completed   Zoster (Shingles) Vaccine  Completed   HPV Vaccine  Aged Out    Advanced directives: We have a copy of your advanced directives available in your record should your provider ever need to access them.   Conditions/risks identified: Aim for 30 minutes of exercise or brisk walking, 6-8 glasses of water, and 5 servings of fruits and vegetables each day.   Next appointment: Follow up in one year for your annual wellness visit.   Preventive Care 76 Years and Older, Male  Preventive care refers to lifestyle choices and visits with your health care provider that can promote health and wellness. What does preventive care include? A yearly physical exam. This is also called an annual well check. Dental exams once or twice a year. Routine eye exams. Ask your health care provider how often you should have your eyes checked. Personal lifestyle choices, including: Daily care of your teeth and gums. Regular physical activity. Eating a healthy diet. Avoiding tobacco and drug use. Limiting alcohol use. Practicing safe sex. Taking low doses of aspirin every day. Taking vitamin and mineral supplements as recommended by your health care provider. What happens  during an annual well check? The services and screenings done by your health care provider during your annual well check will depend on your age, overall health, lifestyle risk factors, and family history of disease. Counseling  Your health care provider may ask you questions about your: Alcohol use. Tobacco use. Drug use. Emotional well-being. Home and relationship well-being. Sexual activity. Eating habits. History of falls. Memory and ability to understand (cognition). Work and work Astronomer. Screening  You may have the following tests or measurements: Height, weight, and BMI. Blood pressure. Lipid and cholesterol levels. These may be checked every 5 years, or more frequently if you are over 81 years old. Skin check. Lung cancer screening. You may have this screening every year starting at age 20 if you have a 30-pack-year history of smoking and currently smoke or have quit within the past 15 years. Fecal occult blood test (FOBT) of the stool. You may have this test every year starting at age 70. Flexible sigmoidoscopy or colonoscopy. You may have a sigmoidoscopy every 5 years or a colonoscopy every 10 years starting at age 33. Prostate cancer screening. Recommendations will vary depending on your family history and other risks. Hepatitis C blood test. Hepatitis B blood test. Sexually transmitted disease (STD) testing. Diabetes screening. This is done by checking your blood sugar (glucose) after you have not eaten for a while (fasting). You may have this done every 1-3 years. Abdominal aortic aneurysm (AAA) screening. You may  need this if you are a current or former smoker. Osteoporosis. You may be screened starting at age 59 if you are at high risk. Talk with your health care provider about your test results, treatment options, and if necessary, the need for more tests. Vaccines  Your health care provider may recommend certain vaccines, such as: Influenza vaccine. This is  recommended every year. Tetanus, diphtheria, and acellular pertussis (Tdap, Td) vaccine. You may need a Td booster every 10 years. Zoster vaccine. You may need this after age 45. Pneumococcal 13-valent conjugate (PCV13) vaccine. One dose is recommended after age 48. Pneumococcal polysaccharide (PPSV23) vaccine. One dose is recommended after age 16. Talk to your health care provider about which screenings and vaccines you need and how often you need them. This information is not intended to replace advice given to you by your health care provider. Make sure you discuss any questions you have with your health care provider. Document Released: 05/30/2015 Document Revised: 01/21/2016 Document Reviewed: 03/04/2015 Elsevier Interactive Patient Education  2017 [employer].  Fall Prevention in the Home Falls can cause injuries. They can happen to people of all ages. There are many things you can do to make your home safe and to help prevent falls. What can I do on the outside of my home? Regularly fix the edges of walkways and driveways and fix any cracks. Remove anything that might make you trip as you walk through a door, such as a raised step or threshold. Trim any bushes or trees on the path to your home. Use bright outdoor lighting. Clear any walking paths of anything that might make someone trip, such as rocks or tools. Regularly check to see if handrails are loose or broken. Make sure that both sides of any steps have handrails. Any raised decks and porches should have guardrails on the edges. Have any leaves, snow, or ice cleared regularly. Use sand or salt on walking paths during winter. Clean up any spills in your garage right away. This includes oil or grease spills. What can I do in the bathroom? Use night lights. Install grab bars by the toilet and in the tub and shower. Do not use towel bars as grab bars. Use non-skid mats or decals in the tub or shower. If you need to sit down in  the shower, use a plastic, non-slip stool. Keep the floor dry. Clean up any water that spills on the floor as soon as it happens. Remove soap buildup in the tub or shower regularly. Attach bath mats securely with double-sided non-slip rug tape. Do not have throw rugs and other things on the floor that can make you trip. What can I do in the bedroom? Use night lights. Make sure that you have a light by your bed that is easy to reach. Do not use any sheets or blankets that are too big for your bed. They should not hang down onto the floor. Have a firm chair that has side arms. You can use this for support while you get dressed. Do not have throw rugs and other things on the floor that can make you trip. What can I do in the kitchen? Clean up any spills right away. Avoid walking on wet floors. Keep items that you use a lot in easy-to-reach places. If you need to reach something above you, use a strong step stool that has a grab bar. Keep electrical cords out of the way. Do not use floor polish or wax that makes  floors slippery. If you must use wax, use non-skid floor wax. Do not have throw rugs and other things on the floor that can make you trip. What can I do with my stairs? Do not leave any items on the stairs. Make sure that there are handrails on both sides of the stairs and use them. Fix handrails that are broken or loose. Make sure that handrails are as long as the stairways. Check any carpeting to make sure that it is firmly attached to the stairs. Fix any carpet that is loose or worn. Avoid having throw rugs at the top or bottom of the stairs. If you do have throw rugs, attach them to the floor with carpet tape. Make sure that you have a light switch at the top of the stairs and the bottom of the stairs. If you do not have them, ask someone to add them for you. What else can I do to help prevent falls? Wear shoes that: Do not have high heels. Have rubber bottoms. Are comfortable  and fit you well. Are closed at the toe. Do not wear sandals. If you use a stepladder: Make sure that it is fully opened. Do not climb a closed stepladder. Make sure that both sides of the stepladder are locked into place. Ask someone to hold it for you, if possible. Clearly mark and make sure that you can see: Any grab bars or handrails. First and last steps. Where the edge of each step is. Use tools that help you move around (mobility aids) if they are needed. These include: Canes. Walkers. Scooters. Crutches. Turn on the lights when you go into a dark area. Replace any light bulbs as soon as they burn out. Set up your furniture so you have a clear path. Avoid moving your furniture around. If any of your floors are uneven, fix them. If there are any pets around you, be aware of where they are. Review your medicines with your doctor. Some medicines can make you feel dizzy. This can increase your chance of falling. Ask your doctor what other things that you can do to help prevent falls. This information is not intended to replace advice given to you by your health care provider. Make sure you discuss any questions you have with your health care provider. Document Released: 02/27/2009 Document Revised: 10/09/2015 Document Reviewed: 06/07/2014 Elsevier Interactive Patient Education  2017 [employer].

## 2022-10-07 NOTE — Progress Notes (Signed)
Subjective:   Brett [name] is a 77 y.o. male who presents for Medicare Annual/Subsequent preventive examination.  I connected with  Mcarthur Rossetti on 10/07/22 by a audio enabled telemedicine application and verified that I am speaking with the correct person using two identifiers.  Patient Location: Home  Provider Location: Home Office  I discussed the limitations of evaluation and management by telemedicine. The patient expressed understanding and agreed to proceed.  Review of Systems     Cardiac Risk Factors include: advanced age (>38men, >108 women);hypertension;male gender     Objective:    Today's Vitals   10/07/22 1432  Weight: 190 lb (86.2 kg)  Height: 5' 6.75" (1.695 m)   Body mass index is 29.98 kg/m.     10/07/2022    2:35 PM 10/05/2021    2:39 AM 01/21/2021    7:20 AM 01/12/2021    8:57 AM 12/12/2020   10:13 AM 06/06/2020    7:36 AM 07/31/2019    8:13 AM  Advanced Directives  Does Patient Have a Medical Advance Directive? Yes Yes Yes Yes No Yes Yes  Type of Estate agent of [place];Living will Living will Healthcare Power of [place];Living will Healthcare Power of [place];Living will  Healthcare Power of [place];Living will   Does patient want to make changes to medical advance directive? No - Patient declined  No - Patient declined   No - Patient declined Yes (MAU/Ambulatory/Procedural Areas - Information given)  Copy of Healthcare Power of Attorney in Chart? Yes - validated most recent copy scanned in chart (See row information)  No - copy requested   Yes - validated most recent copy scanned in chart (See row information)   Would patient like information on creating a medical advance directive?     No - Patient declined      Current Medications (verified) Outpatient Encounter Medications as of 10/07/2022  Medication Sig   baclofen (LIORESAL) 10 MG tablet Take 1-2 tablets (10-20 mg total) by mouth at bedtime.   colestipol (COLESTID) 1 g  tablet Take 1 tablet (1 g total) by mouth 2 (two) times daily.   diclofenac (VOLTAREN) 75 MG EC tablet TAKE 1 TABLET TWICE A DAY (Patient taking differently: 75 mg as needed.)   diltiazem (CARDIZEM CD) 180 MG 24 hr capsule Take 1 capsule (180 mg total) by mouth daily.   ezetimibe (ZETIA) 10 MG tablet Take 10 mg by mouth at bedtime.   famotidine (PEPCID) 20 MG tablet Take 1 tablet (20 mg total) by mouth daily.   fenofibrate (TRICOR) 48 MG tablet Take 1 tablet (48 mg total) by mouth daily.   losartan (COZAAR) 50 MG tablet Take 50 mg by mouth daily.   pantoprazole (PROTONIX) 40 MG tablet Take 1 tablet (40 mg total) by mouth 2 (two) times daily.   tamsulosin (FLOMAX) 0.4 MG CAPS capsule Take 1 capsule (0.4 mg total) by mouth daily.   No facility-administered encounter medications on file as of 10/07/2022.    Allergies (verified) Other, Gabapentin (once-daily), Statins, and Tramadol   History: Past Medical History:  Diagnosis Date   Allergy    seasonal   Anemia 06/2019   iron deficiency   Arthritis    Cataract    Diverticulosis    GERD (gastroesophageal reflux disease)    Heart murmur    Hiatal hernia 2014   Dr. Elisabeth [facility])   History of hiatal hernia    Hyperlipidemia    Hypertension    IBS (irritable  colon syndrome)    Irregular heart beat    Pneumonia    hx. of   PONV (postoperative nausea and vomiting)    with gallbladder surgery   SCC (squamous cell carcinoma) 01/02/2019   R temple-txpbx   Sleep apnea    cpap   Vertigo    Past Surgical History:  Procedure Laterality Date   CARPAL TUNNEL RELEASE Bilateral 05/2022   CHOLECYSTECTOMY  2004   Post-cholecystectomy syndrome    COLONOSCOPY     ESOPHAGOGASTRODUODENOSCOPY     EYE SURGERY     cataract   FRACTURE SURGERY  2006   fx. wrist   JOINT REPLACEMENT     KNEE SURGERY Left 2020   LUMBAR DISC SURGERY     L4-L5   rhino plasty     deviated septum   SHOULDER SURGERY  2011   rotator cuff   TOTAL HIP ARTHROPLASTY  Right 12/13/2016   Procedure: RIGHT TOTAL HIP ARTHROPLASTY ANTERIOR APPROACH;  Surgeon: Samson Frederic, MD;  Location: [facility] OR;  Service: Orthopedics;  Laterality: Right;   TOTAL SHOULDER ARTHROPLASTY Right 04/15/2017   Procedure: RIGHT TOTAL SHOULDER ARTHROPLASTY;  Surgeon: Beverely Low, MD;  Location: [facility] OR;  Service: Orthopedics;  Laterality: Right;   TYMPANOSTOMY TUBE PLACEMENT Left 2020   UPPER GASTROINTESTINAL ENDOSCOPY     VASECTOMY  1980   Family History  Problem Relation Age of Onset   Cancer Sister        uterine    Cancer Maternal Grandmother    Colon cancer Neg Hx    Colon polyps Neg Hx    Esophageal cancer Neg Hx    Rectal cancer Neg Hx    Stomach cancer Neg Hx    Social History   Socioeconomic History   Marital status: Married    Spouse name: Not on file   Number of children: 2   Years of education: Not on file   Highest education level: Not on file  Occupational History   Not on file  Tobacco Use   Smoking status: Former    Packs/day: 2.00    Years: 51.00    Additional pack years: 0.00    Total pack years: 102.00    Types: Cigarettes, Cigars    Start date: 1963    Quit date: 2012    Years since quitting: 12.4   Smokeless tobacco: Never  Vaping Use   Vaping Use: Never used  Substance and Sexual Activity   Alcohol use: No   Drug use: No   Sexual activity: Yes  Other Topics Concern   Not on file  Social History Narrative   Not on file   Social Determinants of Health   Financial Resource Strain: Low Risk  (10/07/2022)   Overall Financial Resource Strain (CARDIA)    Difficulty of Paying Living Expenses: Not hard at all  Food Insecurity: No Food Insecurity (10/07/2022)   Hunger Vital Sign    Worried About Running Out of Food in the Last Year: Never true    Ran Out of Food in the Last Year: Never true  Transportation Needs: No Transportation Needs (10/07/2022)   PRAPARE - Administrator, Civil Service (Medical): No    Lack of  Transportation (Non-Medical): No  Physical Activity: Sufficiently Active (10/07/2022)   Exercise Vital Sign    Days of Exercise per Week: 5 days    Minutes of Exercise per Session: 150+ min  Stress: No Stress Concern Present (10/07/2022)   [employer] of  Occupational Health - Occupational Stress Questionnaire    Feeling of Stress : Not at all  Social Connections: Moderately Integrated (10/07/2022)   Social Connection and Isolation Panel [NHANES]    Frequency of Communication with Friends and Family: Once a week    Frequency of Social Gatherings with Friends and Family: Three times a week    Attends Religious Services: 1 to 4 times per year    Active Member of Clubs or Organizations: No    Attends Banker Meetings: Never    Marital Status: Married    Tobacco Counseling Counseling given: Not Answered   Clinical Intake:  Pre-visit preparation completed: Yes  Pain : No/denies pain  Diabetes: No  How often do you need to have someone help you when you read instructions, pamphlets, or other written materials from your doctor or pharmacy?: 1 - Never  Diabetic?No   Interpreter Needed?: No  Information entered by :: Kandis Fantasia LPN   Activities of Daily Living    10/07/2022    2:35 PM 10/05/2022    2:50 PM  In your present state of health, do you have any difficulty performing the following activities:  Hearing? 0 0  Vision? 0 0  Difficulty concentrating or making decisions? 0 0  Walking or climbing stairs? 0 0  Dressing or bathing? 0 0  Doing errands, shopping? 0 0  Preparing Food and eating ? N N  Using the Toilet? N N  In the past six months, have you accidently leaked urine? N N  Do you have problems with loss of bowel control? N N  Managing your Medications? N N  Managing your Finances? N N  Housekeeping or managing your Housekeeping? N N    Patient Care Team: Park Meo, FNP as PCP - General (Family Medicine) Janalyn Harder, MD  (Inactive) as Consulting Physician (Dermatology) Tessa Lerner, DO as Consulting Physician (Cardiology) Coletta Memos, MD as Consulting Physician (Neurosurgery) Dominica Severin, MD as Consulting Physician (Orthopedic Surgery)  Indicate any recent Medical Services you may have received from other than [facility] providers in the past year (date may be approximate).     Assessment:   This is a routine wellness examination for [place].  Hearing/Vision screen Hearing Screening - Comments:: Hearing loss; followed by audiology  Vision Screening - Comments:: Wears rx glasses - up to date with routine eye exams with Lenn Sink    Dietary issues and exercise activities discussed: Current Exercise Habits: Home exercise routine, Type of exercise: stretching;walking, Time (Minutes): > 60, Frequency (Times/Week): 5, Weekly Exercise (Minutes/Week): 0, Intensity: Mild   Goals Addressed             This Visit's Progress    Remain active and independent         Depression Screen    10/07/2022    2:34 PM 09/01/2022    3:08 PM 01/29/2021    8:09 AM 02/13/2020    8:02 AM 07/31/2019    8:11 AM 04/02/2019    8:07 AM 11/28/2018    8:05 AM  PHQ 2/9 Scores  PHQ - 2 Score 0 0 0 0 0 0 0  PHQ- 9 Score 0 0   0      Fall Risk    10/07/2022    2:33 PM 10/05/2022    2:50 PM 09/01/2022    3:08 PM 01/29/2021    8:09 AM 07/29/2020   10:07 AM  Fall Risk   Falls in the past year? 0 0  0 0 0  Number falls in past yr: 0  0 0 0  Injury with Fall? 0  0 0 0  Risk for fall due to : No Fall Risks      Follow up Falls prevention discussed;Education provided;Falls evaluation completed   Falls evaluation completed     FALL RISK PREVENTION PERTAINING TO THE HOME:  Any stairs in or around the home? Yes  If so, are there any without handrails? No  Home free of loose throw rugs in walkways, pet beds, electrical cords, etc? Yes  Adequate lighting in your home to reduce risk of falls? Yes   ASSISTIVE DEVICES UTILIZED  TO PREVENT FALLS:  Life alert? No  Use of a cane, walker or w/c? No  Grab bars in the bathroom? Yes  Shower chair or bench in shower? No  Elevated toilet seat or a handicapped toilet? Yes   TIMED UP AND GO:  Was the test performed? No . Telephonic visit    Cognitive Function:        10/07/2022    2:36 PM 07/29/2020    8:33 AM  6CIT Screen  What Year? 0 points 0 points  What month? 0 points 0 points  What time? 0 points 0 points  Count back from 20 0 points 0 points  Months in reverse 0 points 0 points  Repeat phrase 0 points 0 points  Total Score 0 points 0 points    Immunizations Immunization History  Administered Date(s) Administered   COVID-19, mRNA, vaccine(Comirnaty)12 years and older 02/08/2022   Covid-19, Mrna,Vaccine(Spikevax)76yrs and older 02/08/2022   Fluad Quad(high Dose 65+) 02/22/2019, 02/26/2020   Influenza, High Dose Seasonal PF 05/04/2017, 02/21/2018, 01/29/2021   Influenza,inj,Quad PF,6+ Mos 06/18/2016   Influenza-Unspecified 04/16/2017, 02/22/2019, 02/26/2020, 02/08/2022   PFIZER(Purple Top)SARS-COV-2 Vaccination 06/25/2019, 07/20/2019, 03/29/2020, 08/24/2020   Pfizer Covid-19 Vaccine Bivalent Booster 108yrs & up 02/02/2021   Pneumococcal Conjugate-13 10/09/2014   Pneumococcal Polysaccharide-23 10/09/2014   Rsv, Bivalent, Protein Subunit Rsvpref,pf Verdis Frederickson) 02/08/2022   Td (Adult),5 Lf Tetanus Toxid, Preservative Free 07/13/2022   Tdap 12/15/2012   Zoster Recombinat (Shingrix) 02/26/2020, 05/13/2020   Zoster, Live 05/17/2010    TDAP status: Up to date  Pneumococcal vaccine status: Up to date  Covid-19 vaccine status: Information provided on how to obtain vaccines.   Qualifies for Shingles Vaccine? Yes   Zostavax completed Yes   Shingrix Completed?: Yes  Screening Tests Health Maintenance  Topic Date Due   Lung Cancer Screening  12/25/2016   Medicare Annual Wellness (AWV)  07/29/2021   COVID-19 Vaccine (7 - 2023-24 season) 04/05/2022    INFLUENZA VACCINE  12/16/2022   Colonoscopy  08/08/2029   DTaP/Tdap/Td (3 - Td or Tdap) 07/13/2032   Pneumonia Vaccine 29+ Years old  Completed   Hepatitis C Screening  Completed   Zoster Vaccines- Shingrix  Completed   HPV VACCINES  Aged Out    Health Maintenance  Health Maintenance Due  Topic Date Due   Lung Cancer Screening  12/25/2016   Medicare Annual Wellness (AWV)  07/29/2021   COVID-19 Vaccine (7 - 2023-24 season) 04/05/2022    Colorectal cancer screening: Type of screening: Colonoscopy. Completed 08/09/19. Repeat every 10 years  Lung Cancer Screening: (Low Dose CT Chest recommended if Age 56-80 years, 30 pack-year currently smoking OR have quit w/in 15years.) does qualify.   Lung Cancer Screening Referral: ordered 09/01/22  Additional Screening:  Hepatitis C Screening: does qualify; Completed 07/29/20  Vision Screening: Recommended annual ophthalmology exams for  early detection of glaucoma and other disorders of the eye. Is the patient up to date with their annual eye exam?  Yes  Who is the provider or what is the name of the office in which the patient attends annual eye exams? [facility] If pt is not established with a provider, would they like to be referred to a provider to establish care? No .   Dental Screening: Recommended annual dental exams for proper oral hygiene  Community Resource Referral / Chronic Care Management: CRR required this visit?  No   CCM required this visit?  No      Plan:     I have personally reviewed and noted the following in the patient's chart:   Medical and social history Use of alcohol, tobacco or illicit drugs  Current medications and supplements including opioid prescriptions. Patient is not currently taking opioid prescriptions. Functional ability and status Nutritional status Physical activity Advanced directives List of other physicians Hospitalizations, surgeries, and ER visits in previous 12  months Vitals Screenings to include cognitive, depression, and falls Referrals and appointments  In addition, I have reviewed and discussed with patient certain preventive protocols, quality metrics, and best practice recommendations. A written personalized care plan for preventive services as well as general preventive health recommendations were provided to patient.     Durwin Nora, [place]   9/60/4540   Due to this being a virtual visit, the after visit summary with patients personalized plan was offered to patient via mail or my-chart. Patient would like to access on my-chart  Nurse Notes: No concerns

## 2022-10-22 ENCOUNTER — Ambulatory Visit (HOSPITAL_COMMUNITY)
Admission: RE | Admit: 2022-10-22 | Discharge: 2022-10-22 | Disposition: A | Discharge status: Home / Self Care | Payer: Medicare HMO | Source: Ambulatory Visit | Attending: Cardiology | Admitting: Cardiology

## 2022-10-22 DIAGNOSIS — I700 Atherosclerosis of aorta: Secondary | ICD-10-CM | POA: Insufficient documentation

## 2022-10-22 DIAGNOSIS — E781 Pure hyperglyceridemia: Secondary | ICD-10-CM | POA: Insufficient documentation

## 2022-10-22 DIAGNOSIS — E7800 Pure hypercholesterolemia, unspecified: Secondary | ICD-10-CM | POA: Insufficient documentation

## 2022-10-27 NOTE — Progress Notes (Signed)
Patient informed. 

## 2022-10-28 ENCOUNTER — Encounter: Payer: Self-pay | Admitting: Cardiology

## 2022-10-28 NOTE — Telephone Encounter (Signed)
That is to be expected / seen as we d/c norvasc (which acts more on BP). Hence his BP is trending up.   Cardizem (new med) controls the pulse and extra beats (less effect on BP).   Have him increase losartan to 100mg  po qday and order/release BMP in one week.   Brett Cadogan Butler, DO, [facility]

## 2022-10-28 NOTE — Telephone Encounter (Signed)
From patient.

## 2022-10-29 ENCOUNTER — Other Ambulatory Visit: Payer: Self-pay

## 2022-10-29 DIAGNOSIS — I493 Ventricular premature depolarization: Secondary | ICD-10-CM

## 2022-11-16 LAB — BASIC METABOLIC PANEL
BUN/Creatinine Ratio: 11 (ref 10–24)
BUN: 12 mg/dL (ref 8–27)
CO2: 26 mmol/L (ref 20–29)
Calcium: 9.1 mg/dL (ref 8.6–10.2)
Chloride: 103 mmol/L (ref 96–106)
Creatinine, Ser: 1.09 mg/dL (ref 0.76–1.27)
Glucose: 79 mg/dL (ref 70–99)
Potassium: 4.4 mmol/L (ref 3.5–5.2)
Sodium: 141 mmol/L (ref 134–144)
eGFR: 70 mL/min/{1.73_m2} (ref >59)

## 2022-11-23 ENCOUNTER — Other Ambulatory Visit: Payer: Self-pay

## 2022-11-23 ENCOUNTER — Telehealth: Payer: Self-pay | Admitting: Gastroenterology

## 2022-11-23 MED ORDER — BACLOFEN 10 MG PO TABS: 10.0000 mg | ORAL_TABLET | Freq: Every day | ORAL | 1 refills | Status: DC

## 2022-11-23 NOTE — Telephone Encounter (Signed)
Inbound call from patient stating he needs next refill for baclofen medication switched to [facility] instead of his pharmacy on file. Please advise, thank you.

## 2022-11-23 NOTE — Telephone Encounter (Signed)
Patient last seen in May.  Dr. Adela Lank prescribed baclofen at bedtime. New script sent to [facility] as requested.

## 2022-11-30 ENCOUNTER — Other Ambulatory Visit: Payer: Self-pay | Admitting: Cardiology

## 2022-11-30 DIAGNOSIS — I493 Ventricular premature depolarization: Secondary | ICD-10-CM

## 2022-12-06 ENCOUNTER — Encounter: Payer: Self-pay | Admitting: Family Medicine

## 2022-12-06 ENCOUNTER — Ambulatory Visit (INDEPENDENT_AMBULATORY_CARE_PROVIDER_SITE_OTHER): Payer: Medicare HMO | Admitting: Family Medicine

## 2022-12-06 VITALS — BP 140/72 | HR 62 | Temp 97.8°F | Ht 66.0 in | Wt 189.0 lb

## 2022-12-06 DIAGNOSIS — M2550 Pain in unspecified joint: Secondary | ICD-10-CM | POA: Insufficient documentation

## 2022-12-06 MED ORDER — MELOXICAM 15 MG PO TABS: 15.0000 mg | ORAL_TABLET | Freq: Every day | ORAL | 0 refills | Status: DC

## 2022-12-06 NOTE — Assessment & Plan Note (Signed)
Patient is here for generalized worsening joint pain. He would like to be tested for Lyme disease, we discussed the symptoms and likelihood of Lyme and he would like to proceed. Will trial Mobic 15mg  daily to see if this helps with his joint pain. Instructed not to take with Diclofenac or Ibuprofen.

## 2022-12-06 NOTE — Progress Notes (Signed)
Subjective:  HPI: Brett [name] is a 77 y.o. male presenting on 12/06/2022 for No chief complaint on file.   HPI Patient is in today for worsening joint pain since he was bit by several spiders. The first occurrence was in May on the back of his neck and then he was bit again 3 weeks ago on one of his arms. He did see one of the spiders but did not see the other insect that bit him. He also reports being bitten by several ticks. He was diagnosed with an ear infection on 11/23/2022 and treated with Augmentin, he reports his joint pain was relieved when he took this course of antibiotics and has since returned. He is concerned he may have Lyme disease and would like to be tested. He denies fever, chills, malaise, nausea, vomiting, or headaches. He does not recall having a bull eye rash with any of his tick bites. He is taking Tylenol and Diclofenac for his joint pain.  Review of Systems  All other systems reviewed and are negative.   Relevant past medical history reviewed and updated as indicated.   Past Medical History:  Diagnosis Date   Allergy    seasonal   Anemia 06/2019   iron deficiency   Arthritis    Cataract    Diverticulosis    GERD (gastroesophageal reflux disease)    Heart murmur    Hiatal hernia 2014   Dr. Elisabeth [facility]a [facility])   History of hiatal hernia    Hyperlipidemia    Hypertension    IBS (irritable colon syndrome)    Irregular heart beat    Pneumonia    hx. of   PONV (postoperative nausea and vomiting)    with gallbladder surgery   SCC (squamous cell carcinoma) 01/02/2019   R temple-txpbx   Sleep apnea    cpap   Vertigo      Past Surgical History:  Procedure Laterality Date   CARPAL TUNNEL RELEASE Bilateral 05/2022   CHOLECYSTECTOMY  2004   Post-cholecystectomy syndrome    COLONOSCOPY     ESOPHAGOGASTRODUODENOSCOPY     EYE SURGERY     cataract   FRACTURE SURGERY  2006   fx. wrist   JOINT REPLACEMENT     KNEE SURGERY Left 2020   LUMBAR DISC SURGERY      L4-L5   rhino plasty     deviated septum   SHOULDER SURGERY  2011   rotator cuff   TOTAL HIP ARTHROPLASTY Right 12/13/2016   Procedure: RIGHT TOTAL HIP ARTHROPLASTY ANTERIOR APPROACH;  Surgeon: Samson Frederic, MD;  Location: [facility] OR;  Service: Orthopedics;  Laterality: Right;   TOTAL SHOULDER ARTHROPLASTY Right 04/15/2017   Procedure: RIGHT TOTAL SHOULDER ARTHROPLASTY;  Surgeon: Beverely Low, MD;  Location[facility] OR;  Service: Orthopedics;  Laterality: Right;   TYMPANOSTOMY TUBE PLACEMENT Left 2020   UPPER GASTROINTESTINAL ENDOSCOPY     VASECTOMY  1980    Allergies and medications reviewed and updated.   Current Outpatient Medications:    baclofen (LIORESAL) 10 MG tablet, Take 1-2 tablets (10-20 mg total) by mouth at bedtime., Disp: 180 each, Rfl: 1   colestipol (COLESTID) 1 g tablet, Take 1 tablet (1 g total) by mouth 2 (two) times daily., Disp: 180 tablet, Rfl: 3   diclofenac (VOLTAREN) 75 MG EC tablet, TAKE 1 TABLET TWICE A DAY (Patient taking differently: 75 mg as needed.), Disp: 60 tablet, Rfl: 11   diltiazem (CARDIZEM CD) 180 MG 24 hr capsule, TAKE 1 CAPSULE  BY MOUTH DAILY, Disp: 90 capsule, Rfl: 3   ezetimibe (ZETIA) 10 MG tablet, Take 10 mg by mouth at bedtime., Disp: , Rfl:    famotidine (PEPCID) 20 MG tablet, Take 1 tablet (20 mg total) by mouth daily., Disp: 10 tablet, Rfl: 0   fenofibrate (TRICOR) 48 MG tablet, Take 1 tablet (48 mg total) by mouth daily., Disp: 90 tablet, Rfl: 3   losartan (COZAAR) 50 MG tablet, Take 100 mg by mouth daily., Disp: , Rfl:    pantoprazole (PROTONIX) 40 MG tablet, Take 1 tablet (40 mg total) by mouth 2 (two) times daily., Disp: 180 tablet, Rfl: 3   tamsulosin (FLOMAX) 0.4 MG CAPS capsule, Take 1 capsule (0.4 mg total) by mouth daily., Disp: 180 capsule, Rfl: 3  Allergies  Allergen Reactions   Other     RSD   Gabapentin (Once-Daily)     Nausea    Statins     myalgias   Tramadol Nausea Only    Objective:   BP (!) 140/72   Pulse 62    Temp 97.8 F (36.6 C) (Oral)   Ht 5\' 6"  (1.676 m)   Wt 189 lb (85.7 kg)   SpO2 95%   BMI 30.51 kg/m      12/06/2022    8:12 AM 10/07/2022    2:32 PM 09/30/2022    1:20 PM  Vitals with BMI  Height 5\' 6"  5' 6.75" 5' 6.75"  Weight 189 lbs 190 lbs 190 lbs 2 oz  BMI 30.52 30 30.02  Systolic 140 -- 120  Diastolic 72 -- 54  Pulse 62  84     Physical Exam Vitals and nursing note reviewed.  Constitutional:      Appearance: Normal appearance. He is normal weight.  HENT:     Head: Normocephalic and atraumatic.  Skin:    General: Skin is warm and dry.     Capillary Refill: Capillary refill takes less than 2 seconds.  Neurological:     General: No focal deficit present.     Mental Status: He is alert and oriented to person, place, and time. Mental status is at baseline.  Psychiatric:        Mood and Affect: Mood normal.        Behavior: Behavior normal.        Thought Content: Thought content normal.        Judgment: Judgment normal.     Assessment & Plan:  Arthralgia, unspecified joint Assessment & Plan: Patient is here for generalized worsening joint pain. He would like to be tested for Lyme disease, we discussed the symptoms and likelihood of Lyme and he would like to proceed. Will trial Mobic 15mg  daily to see if this helps with his joint pain. Instructed not to take with Diclofenac or Ibuprofen.  Orders: -     B. burgdorfi antibodies by WB     Follow up plan: Return if symptoms worsen or fail to improve.  Park Meo, FNP

## 2022-12-09 LAB — B. BURGDORFI ANTIBODIES BY WB
B burgdorferi IgG Abs (IB): NEGATIVE
B burgdorferi IgM Abs (IB): NEGATIVE
Lyme Disease 18 kD IgG: NONREACTIVE
Lyme Disease 28 kD IgG: NONREACTIVE
Lyme Disease 30 kD IgG: NONREACTIVE
Lyme Disease 39 kD IgG: NONREACTIVE
Lyme Disease 39 kD IgM: NONREACTIVE
Lyme Disease 41 kD IgG: REACTIVE — AB
Lyme Disease 41 kD IgM: NONREACTIVE
Lyme Disease 45 kD IgG: NONREACTIVE
Lyme Disease 58 kD IgG: REACTIVE — AB
Lyme Disease 66 kD IgG: NONREACTIVE

## 2022-12-15 ENCOUNTER — Other Ambulatory Visit: Payer: Self-pay

## 2022-12-15 ENCOUNTER — Telehealth: Payer: Self-pay

## 2022-12-15 MED ORDER — PANTOPRAZOLE SODIUM 40 MG PO TBEC: 40.0000 mg | DELAYED_RELEASE_TABLET | Freq: Two times a day (BID) | ORAL | 3 refills | Status: DC

## 2022-12-15 MED ORDER — FAMOTIDINE 20 MG PO TABS: 20.0000 mg | ORAL_TABLET | Freq: Every day | ORAL | 0 refills | Status: DC

## 2022-12-15 NOTE — Telephone Encounter (Signed)
Pt came in to office to get these refills filled by pcp. Pt states that these refill request are still being sent to previous pcpand pt needs refills of these meds sent to [facility] please:  pantoprazole (PROTONIX) 40 MG tablet [[id number]] famotidine (PEPCID) 20 MG tablet [[id number]]   Cb#: [phone]

## 2022-12-20 ENCOUNTER — Other Ambulatory Visit: Payer: Self-pay

## 2022-12-20 MED ORDER — LOSARTAN POTASSIUM 100 MG PO TABS: 100.0000 mg | ORAL_TABLET | Freq: Every day | ORAL | 3 refills | Status: DC

## 2022-12-20 NOTE — Progress Notes (Signed)
Spoke to patient he voiced understanding patient stated his systolic is sill running in the 140's I went ahead and sent Losartan 100 mg MAR has been updated

## 2022-12-21 DIAGNOSIS — M19019 Primary osteoarthritis, unspecified shoulder: Secondary | ICD-10-CM | POA: Insufficient documentation

## 2022-12-24 ENCOUNTER — Telehealth: Payer: Self-pay

## 2022-12-24 ENCOUNTER — Other Ambulatory Visit: Payer: Self-pay

## 2022-12-24 DIAGNOSIS — I10 Essential (primary) hypertension: Secondary | ICD-10-CM

## 2022-12-24 MED ORDER — CHLORTHALIDONE 25 MG PO TABS
25.0000 mg | ORAL_TABLET | Freq: Every day | ORAL | 0 refills | Status: DC
Start: 2022-12-24 — End: 2023-01-11

## 2022-12-24 NOTE — Telephone Encounter (Signed)
Patient stated that  his BP has been running high ( 160/90+) even after taking his Losartan twice a day now.  148/68 was the lowest reading he has had so far. Patient stated that when he was advised to increase his Losartan to 2 tabs daily, he was asked to stop Amlodipine and start Diltiazem. Please advise.

## 2022-12-24 NOTE — Telephone Encounter (Signed)
Since home blood pressures are still not at goal. Start chlorthalidone 25 mg p.o. every morning. Continue losartan 100 mg p.o. every evening. Check BMP and magnesium level in 1 week after starting chlorthalidone.  Please order and release labs.  Kiwan G[place]den [place], DO, [facility]

## 2022-12-24 NOTE — Telephone Encounter (Signed)
Called and spoke with patient regarding his high BP. Chlorthalidone 25 mg sent to patients pharmacy and patient advised to continue Losartan 100 mg daily. Patient is aware that after starting Chlorthalidone, he is to go to [facility] to have labs 1 week after starting medication. Medication has been sent. Patient aware.

## 2022-12-29 ENCOUNTER — Encounter: Payer: Self-pay | Admitting: *Deleted

## 2023-01-04 LAB — MAGNESIUM: Magnesium: 2.1 mg/dL (ref 1.6–2.3)

## 2023-01-04 LAB — BASIC METABOLIC PANEL: eGFR: 65 mL/min/{1.73_m2} (ref >59)

## 2023-01-05 NOTE — Progress Notes (Signed)
Called and spoke to patient he voiced understanding. Patient made aware to continue medication. Patient has kept a log of his blood pressure   8/16  156/64 8/17  116/52 am , 118/61 pm  8/18  124/56 am , 113/59 noon , 143/61 pm  8/19   146/68

## 2023-01-05 NOTE — Progress Notes (Signed)
Called pt and LVM

## 2023-01-10 ENCOUNTER — Encounter: Payer: Self-pay | Admitting: Cardiology

## 2023-01-10 NOTE — Telephone Encounter (Signed)
From patient.

## 2023-01-11 ENCOUNTER — Other Ambulatory Visit: Payer: Self-pay | Admitting: Cardiology

## 2023-01-11 DIAGNOSIS — I493 Ventricular premature depolarization: Secondary | ICD-10-CM

## 2023-01-11 DIAGNOSIS — I10 Essential (primary) hypertension: Secondary | ICD-10-CM

## 2023-01-11 MED ORDER — DILTIAZEM HCL ER COATED BEADS 180 MG PO CP24
180.0000 mg | ORAL_CAPSULE | Freq: Every morning | ORAL | 3 refills | Status: DC
Start: 2023-01-11 — End: 2023-06-03

## 2023-01-11 MED ORDER — CHLORTHALIDONE 25 MG PO TABS
25.0000 mg | ORAL_TABLET | Freq: Every morning | ORAL | 0 refills | Status: DC
Start: 2023-01-11 — End: 2023-03-21

## 2023-01-11 MED ORDER — LOSARTAN POTASSIUM 100 MG PO TABS: 100.0000 mg | ORAL_TABLET | Freq: Every evening | ORAL | 3 refills | Status: DC

## 2023-01-11 NOTE — Progress Notes (Signed)
Spoke to the patient over the phone.  Overall home blood pressures are well-controlled but at times morning readings could be as high as 150 mmHg.  Currently takes diltiazem and losartan in the morning and chlorthalidone at night.   Refill diltiazem, losartan, chlorthalidone. Recommend taking chlorthalidone in the morning and losartan at night. He will average his blood pressures and send [place] a message.  Tessa Lerner, [place], [facility]  Pager:  [phone] Office: [phone]

## 2023-01-11 NOTE — Telephone Encounter (Signed)
Spoke to him and refills sent.  ST

## 2023-01-12 ENCOUNTER — Encounter: Payer: Self-pay | Admitting: Cardiology

## 2023-01-13 NOTE — Telephone Encounter (Signed)
From patient.

## 2023-01-25 ENCOUNTER — Ambulatory Visit: Payer: Medicare HMO | Admitting: Cardiology

## 2023-01-25 ENCOUNTER — Encounter: Payer: Self-pay | Admitting: Cardiology

## 2023-01-25 VITALS — BP 137/60 | HR 56 | Resp 16 | Ht 66.0 in | Wt 181.0 lb

## 2023-01-25 DIAGNOSIS — I493 Ventricular premature depolarization: Secondary | ICD-10-CM

## 2023-01-25 DIAGNOSIS — I10 Essential (primary) hypertension: Secondary | ICD-10-CM

## 2023-01-25 DIAGNOSIS — I2510 Atherosclerotic heart disease of native coronary artery without angina pectoris: Secondary | ICD-10-CM

## 2023-01-25 DIAGNOSIS — I700 Atherosclerosis of aorta: Secondary | ICD-10-CM

## 2023-01-25 DIAGNOSIS — Z87891 Personal history of nicotine dependence: Secondary | ICD-10-CM

## 2023-01-25 DIAGNOSIS — E781 Pure hyperglyceridemia: Secondary | ICD-10-CM

## 2023-01-25 DIAGNOSIS — E7800 Pure hypercholesterolemia, unspecified: Secondary | ICD-10-CM

## 2023-01-25 DIAGNOSIS — G4733 Obstructive sleep apnea (adult) (pediatric): Secondary | ICD-10-CM

## 2023-01-25 DIAGNOSIS — Z789 Other specified health status: Secondary | ICD-10-CM

## 2023-01-25 DIAGNOSIS — I340 Nonrheumatic mitral (valve) insufficiency: Secondary | ICD-10-CM

## 2023-01-25 NOTE — Progress Notes (Signed)
ID:  Brett [name], DOB July 02, 1945, MRN [id number]  PCP:  Park Meo, FNP  Cardiologist:  Tessa Lerner, DO, [facility] (established care 12/23/2021).  Date: 01/25/23 Last Office Visit: 09/24/2022  Chief Complaint  Patient presents with   MR   CAC   Follow-up    HPI  Brett [name] is a 77 y.o. Caucasian male whose past medical history and cardiovascular risk factors include: Mitral regurgitation, hyperlipidemia, statin intolerance, GERD, hypertension, hiatal hernia, iron deficiency anemia, former smoker, hypertriglyceridemia, OSA and CPAP.  Referred to practice for evaluation of a cardiac murmur on physical examination as well as echocardiogram he is noted to have mitral regurgitation.  Shared decision was to follow-up on a echo in October 2024 which is still forthcoming.  In the past he also had a Zio patch for frequent PVCs and his PVC burden was 3.8%.  At the last visit we discontinued amlodipine and started him on diltiazem.  In addition given his elevated 10-year risk of ASCVD and lipid profile shared decision was to proceed with coronary calcium score for further risk stratification.  In the past he has refused to be on injectable medication such as Repatha for lipid management.  Since last office visit, denies anginal chest pain or heart failure symptoms.  He is doing well on the new doses of diltiazem and blood pressure medications.  His morning blood pressures are around 140 mmHg and evening blood pressures are closer to 120 mmHg.  Patient is being considered for back surgery in the coming months as well as left shoulder surgery in January 2025.  He walks at least 5000 steps per day overall function capacity is limited due to back pain.  He has had a recent echo and stress test in the past results reviewed and noted below for further reference.  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  ALLERGIES: Allergies  Allergen Reactions   Other     RSD    Gabapentin (Once-Daily)     Nausea    Statins     myalgias   Tramadol Nausea Only    MEDICATION LIST PRIOR TO VISIT: Current Meds  Medication Sig   baclofen (LIORESAL) 10 MG tablet Take 1-2 tablets (10-20 mg total) by mouth at bedtime.   chlorthalidone (HYGROTON) 25 MG tablet Take 1 tablet (25 mg total) by mouth every morning.   colestipol (COLESTID) 1 g tablet Take 1 tablet (1 g total) by mouth 2 (two) times daily.   diclofenac (VOLTAREN) 75 MG EC tablet TAKE 1 TABLET TWICE A DAY (Patient taking differently: 75 mg as needed.)   diltiazem (CARDIZEM CD) 180 MG 24 hr capsule Take 1 capsule (180 mg total) by mouth every morning.   ezetimibe (ZETIA) 10 MG tablet Take 10 mg by mouth at bedtime.   famotidine (PEPCID) 20 MG tablet Take 1 tablet (20 mg total) by mouth daily.   fenofibrate (TRICOR) 48 MG tablet Take 1 tablet (48 mg total) by mouth daily.   losartan (COZAAR) 100 MG tablet Take 1 tablet (100 mg total) by mouth every evening.   meloxicam (MOBIC) 15 MG tablet Take 1 tablet (15 mg total) by mouth daily.   pantoprazole (PROTONIX) 40 MG tablet Take 1 tablet (40 mg total) by mouth 2 (two) times daily.   tamsulosin (FLOMAX) 0.4 MG CAPS capsule Take 1 capsule (0.4 mg total) by mouth daily.     PAST MEDICAL HISTORY: Past Medical History:  Diagnosis Date  Allergy    seasonal   Anemia 06/2019   iron deficiency   Arthritis    Cataract    Diverticulosis    GERD (gastroesophageal reflux disease)    Heart murmur    Hiatal hernia 2014   Dr. Elisabeth Cara [facility])   History of hiatal hernia    Hyperlipidemia    Hypertension    IBS (irritable colon syndrome)    Irregular heart beat    Pneumonia    hx. of   PONV (postoperative nausea and vomiting)    with gallbladder surgery   SCC (squamous cell carcinoma) 01/02/2019   R [id number]   Sleep apnea    cpap   Vertigo     PAST SURGICAL HISTORY: Past Surgical History:  Procedure Laterality Date   CARPAL TUNNEL RELEASE Bilateral  05/2022   CHOLECYSTECTOMY  2004   Post-cholecystectomy syndrome    COLONOSCOPY     ESOPHAGOGASTRODUODENOSCOPY     EYE SURGERY     cataract   FRACTURE SURGERY  2006   fx. wrist   JOINT REPLACEMENT     KNEE SURGERY Left 2020   LUMBAR DISC SURGERY     L4-L5   rhino plasty     deviated septum   SHOULDER SURGERY  2011   rotator cuff   TOTAL HIP ARTHROPLASTY Right 12/13/2016   Procedure: RIGHT TOTAL HIP ARTHROPLASTY ANTERIOR APPROACH;  Surgeon: Samson Frederic, MD;  Location: [facility] OR;  Service: Orthopedics;  Laterality: Right;   TOTAL SHOULDER ARTHROPLASTY Right 04/15/2017   Procedure: RIGHT TOTAL SHOULDER ARTHROPLASTY;  Surgeon: Beverely Low, MD;  Location: [facility] OR;  Service: Orthopedics;  Laterality: Right;   TYMPANOSTOMY TUBE PLACEMENT Left 2020   UPPER GASTROINTESTINAL ENDOSCOPY     VASECTOMY  1980    FAMILY HISTORY: The patient family history includes Cancer in his maternal grandmother and sister.  SOCIAL HISTORY:  The patient  reports that he quit smoking about 12 years ago. His smoking use included cigarettes and cigars. He started smoking about 61 years ago. He has a 102 pack-year smoking history. He has never used smokeless tobacco. He reports that he does not drink alcohol and does not use drugs.  REVIEW OF SYSTEMS: Review of Systems  Constitutional: Positive for weight loss.  Cardiovascular:  Negative for chest pain, claudication, dyspnea on exertion, irregular heartbeat, leg swelling, near-syncope, orthopnea, palpitations, paroxysmal nocturnal dyspnea and syncope.  Respiratory:  Negative for shortness of breath.   Hematologic/Lymphatic: Negative for bleeding problem.  Musculoskeletal:  Negative for muscle cramps and myalgias.  Neurological:  Negative for dizziness and light-headedness.    PHYSICAL EXAM:    01/25/2023    9:38 AM 12/06/2022    8:12 AM 10/07/2022    2:32 PM  Vitals with BMI  Height 5\' 6"  5\' 6"  5' 6.75"  Weight 181 lbs 189 lbs 190 lbs  BMI 29.23 30.52  30  Systolic 137 140 --  Diastolic 60 72 --  Pulse 56 62    Physical Exam  Constitutional: No distress.  Age appropriate, hemodynamically stable.   HENT:  Frank-sign  Neck: No JVD present.  Cardiovascular: Normal rate, regular rhythm, S1 normal, S2 normal, intact distal pulses and normal pulses. Exam reveals no gallop, no S3 and no S4.  Murmur heard. Holosystolic murmur is present with a grade of 3/6 at the apex. Pulses:      Dorsalis pedis pulses are 2+ on the right side and 2+ on the left side.       Posterior tibial  pulses are 2+ on the right side and 2+ on the left side.  Pulmonary/Chest: Effort normal and breath sounds normal. No stridor. He has no wheezes. He has no rales.  Abdominal: Soft. Bowel sounds are normal. He exhibits no distension. There is no abdominal tenderness.  Musculoskeletal:        General: No edema.     Cervical back: Neck supple.  Neurological: He is alert and oriented to person, place, and time. He has intact cranial nerves (2-12).  Skin: Skin is warm and moist.   CARDIAC DATABASE: EKG: September 01, 2022: Sinus rhythm, 74 bpm, frequent PVCs and ventricular bigeminy pattern. Sep 24, 2022: Sinus bradycardia, 56 bpm, without underlying ischemia injury pattern.  Echocardiogram: 01/29/2022: Normal LV systolic function with visual EF 60-65%. Left ventricle cavity is normal in size. Normal left ventricular wall thickness. Normal global wall motion. Doppler evidence of grade I (impaired) diastolic dysfunction, normal LAP. Calculated EF 64%. Trace aortic regurgitation. Moderate (Grade II) mitral regurgitation. Mild tricuspid regurgitation. No evidence of pulmonary hypertension. IVC is dilated with respiratory variation.   Stress Testing: Exercise treadmill stress test 02/12/2022: Exercise treadmill stress test performed using Bruce protocol.  Patient reached 8.8 METS, and 87% of age predicted maximum heart rate.  Exercise capacity was good.  No chest pain  reported.  Normal heart rate and hemodynamic response. Stress EKG revealed no ischemic changes. Low risk study.  Heart Catheterization: None  CT Cardiac Scoring: October 22, 2022 1. Coronary calcium score of 56. This was 25th percentile for age,gender, and race matched controls.  2. Aortic atherosclerosis.  Noncardiac findings: No significant extracardiac findings within the visualized chest.   Zio patch April 2024   Predominant rhythm was normal sinus rhythm with average heart rate 66bpm and ranged from 47 to 141bpm.   Rare PCAs, atrial couplets and atrial triplets   Occasional PVCs (PVC load 3.8%).  rare ventricular couplets, ventricular bigeminy and trigeminy   Nonsustained atrial tachycardia up to 11.4 seconds with fastest heart rate 190bpm  LABORATORY DATA:    Latest Ref Rng & Units 09/02/2022    8:41 AM 01/12/2021    9:07 AM 10/06/2020    9:59 AM  CBC  WBC 3.8 - 10.8 Thousand/uL 8.1  7.0  11.5   Hemoglobin 13.2 - 17.1 g/dL 32.4  40.1  02.7   Hematocrit 38.5 - 50.0 % 41.5  41.9  42.5   Platelets 140 - 400 Thousand/uL 270  307  391        Latest Ref Rng & Units 01/03/2023    9:57 AM 11/15/2022   12:18 PM 09/02/2022    8:41 AM  CMP  Glucose 70 - 99 mg/dL 253  79  664   BUN 8 - 27 mg/dL 15  12  12    Creatinine 0.76 - 1.27 mg/dL 4.03  4.74  2.59   Sodium 134 - 144 mmol/L 138  141  141   Potassium 3.5 - 5.2 mmol/L 4.1  4.4  4.2   Chloride 96 - 106 mmol/L 96  103  104   CO2 20 - 29 mmol/L 29  26  30    Calcium 8.6 - 10.2 mg/dL 9.5  9.1  9.2   Total Protein 6.1 - 8.1 g/dL   6.7   Total Bilirubin 0.2 - 1.2 mg/dL   0.5   AST 10 - 35 U/L   11   ALT 9 - 46 U/L   8     Lipid Panel  Lab  Results  Component Value Date   CHOL 162 09/02/2022   HDL 33 (L) 09/02/2022   LDLCALC 104 (H) 09/02/2022   TRIG 148 09/02/2022   CHOLHDL 4.9 09/02/2022   No components found for: "NTPROBNP" No results for input(s): "PROBNP" in the last 8760 hours. No results for input(s): "TSH" in the last  8760 hours.  BMP Recent Labs    09/02/22 0841 11/15/22 1218 01/03/23 0957  NA 141 141 138  K 4.2 4.4 4.1  CL 104 103 96  CO2 30 26 29   GLUCOSE 100* 79 109*  BUN 12 12 15   CREATININE 1.12 1.09 1.16  CALCIUM 9.2 9.1 9.5     HEMOGLOBIN A1C Lab Results  Component Value Date   HGBA1C 5.8 (H) 01/29/2021   MPG 120 01/29/2021    IMPRESSION:    ICD-10-CM   1. Premature ventricular complex  I49.3     2. Nonrheumatic mitral valve regurgitation  I34.0     3. Essential (primary) hypertension  I10     4. OSA on CPAP  G47.33     5. Coronary atherosclerosis due to calcified coronary lesion  I25.10    I25.84     6. Aortic atherosclerosis (HCC)  I70.0     7. Hypertriglyceridemia  E78.1     8. Pure hypercholesterolemia  E78.00     9. Statin intolerance  Z78.9     10. Former smoker  Z87.891         RECOMMENDATIONS: Brett [name] is a 77 y.o. Caucasian male whose past medical history and cardiac risk factors include: Hyperlipidemia, statin intolerance, GERD, hypertension, hiatal hernia, iron deficiency anemia, former smoker, hypertriglyceridemia, OSA and CPAP.  Premature ventricular complex Noted on surface ECG with his PCP in April 2024. Zio patch notes a PVC burden of approximately 3.8%. Has done well on diltiazem 180 mg p.o. daily. Monitor for now  Nonrheumatic mitral valve regurgitation Asymptomatic. Followed by echo scheduled later this year-tentatively October 2024.  Essential (primary) hypertension Office blood pressures are acceptable. Home blood pressures are usually low 140 mmHg in the morning and in the 120s at night. Continue current medical therapy. Reemphasized importance of low-salt diet and weight loss.  Coronary atherosclerosis due to calcified coronary lesion Aortic atherosclerosis (HCC) Continue aspirin and lipid-lowering therapy. Has undergone echo and stress test in the past. Reemphasized the importance of secondary prevention with focus on  improving her modifiable cardiovascular risk factors such as glycemic control, lipid management, blood pressure control, weight loss.  Hypertriglyceridemia Pure hypercholesterolemia Statin intolerance Currently on Zetia and fenofibrate. History of statin intolerance. Patient plans to have repeat lipids with PCP in October 2024 and based on the results we will consider either continuation of current therapy versus uptitration to other nonstatin medications.  In the past patient has clearly voiced that he does not want to be on injectables if possible. Further recommendations forthcoming. He will let us know when the labs are available after his yearly well visit  Patient states that he is being considered for both back surgery and left shoulder surgery.  Back surgery likely in the coming months and shoulder surgery in January 2025.  Will hold off on EKG at today's office visit but would recommend it closer to his date of surgery.  He has undergone appropriate cardiovascular testing in the recent past as outlined above.  Good functional capacity for age.  Currently he is optimized from a cardiovascular standpoint and is considered to be acceptable candidate for upcoming back  surgery.  Both patient and wife verbalized understanding.  FINAL MEDICATION LIST END OF ENCOUNTER: No orders of the defined types were placed in this encounter.   There are no discontinued medications.     Current Outpatient Medications:    baclofen (LIORESAL) 10 MG tablet, Take 1-2 tablets (10-20 mg total) by mouth at bedtime., Disp: 180 each, Rfl: 1   chlorthalidone (HYGROTON) 25 MG tablet, Take 1 tablet (25 mg total) by mouth every morning., Disp: 90 tablet, Rfl: 0   colestipol (COLESTID) 1 g tablet, Take 1 tablet (1 g total) by mouth 2 (two) times daily., Disp: 180 tablet, Rfl: 3   diclofenac (VOLTAREN) 75 MG EC tablet, TAKE 1 TABLET TWICE A DAY (Patient taking differently: 75 mg as needed.), Disp: 60 tablet, Rfl:  11   diltiazem (CARDIZEM CD) 180 MG 24 hr capsule, Take 1 capsule (180 mg total) by mouth every morning., Disp: 90 capsule, Rfl: 3   ezetimibe (ZETIA) 10 MG tablet, Take 10 mg by mouth at bedtime., Disp: , Rfl:    famotidine (PEPCID) 20 MG tablet, Take 1 tablet (20 mg total) by mouth daily., Disp: 10 tablet, Rfl: 0   fenofibrate (TRICOR) 48 MG tablet, Take 1 tablet (48 mg total) by mouth daily., Disp: 90 tablet, Rfl: 3   losartan (COZAAR) 100 MG tablet, Take 1 tablet (100 mg total) by mouth every evening., Disp: 90 tablet, Rfl: 3   meloxicam (MOBIC) 15 MG tablet, Take 1 tablet (15 mg total) by mouth daily., Disp: 30 tablet, Rfl: 0   pantoprazole (PROTONIX) 40 MG tablet, Take 1 tablet (40 mg total) by mouth 2 (two) times daily., Disp: 180 tablet, Rfl: 3   tamsulosin (FLOMAX) 0.4 MG CAPS capsule, Take 1 capsule (0.4 mg total) by mouth daily., Disp: 180 capsule, Rfl: 3  No orders of the defined types were placed in this encounter.   There are no Patient Instructions on file for this visit.   --Continue cardiac medications as reconciled in final medication list. --Return in about 6 months (around 07/25/2023) for Follow up MR, PVCs, CAC. or sooner if needed. --Continue follow-up with your primary care physician regarding the management of your other chronic comorbid conditions.  Patient's questions and concerns were addressed to his satisfaction. He voices understanding of the instructions provided during this encounter.   This note was created using a voice recognition software as a result there may be grammatical errors inadvertently enclosed that do not reflect the nature of this encounter. Every attempt is made to correct such errors.  Tessa Lerner, [place], [facility]  Pager:  [phone] Office: [phone]

## 2023-01-28 ENCOUNTER — Encounter: Payer: Self-pay | Admitting: Cardiology

## 2023-02-02 ENCOUNTER — Other Ambulatory Visit: Payer: Self-pay | Admitting: Neurosurgery

## 2023-02-08 ENCOUNTER — Encounter: Payer: Self-pay | Admitting: Cardiology

## 2023-02-08 ENCOUNTER — Encounter: Payer: Self-pay | Admitting: Family Medicine

## 2023-02-08 NOTE — Pre-Procedure Instructions (Signed)
Surgical Instructions   Your procedure is scheduled on Wednesday, October 9th. Report to [facility] Main Entrance "A" at 11:45 A.M., then check in with the Admitting office. Any questions or running late day of surgery: call [phone]  Questions prior to your surgery date: call [phone], Monday-Friday, 8am-4pm. If you experience any cold or flu symptoms such as cough, fever, chills, shortness of breath, etc. between now and your scheduled surgery, please notify us at the above number.     Remember:  Do not eat after midnight the night before your surgery  You may drink clear liquids until 10:45 AM the morning of your surgery.   Clear liquids allowed are: Water, Non-Citrus Juices (without pulp), Carbonated Beverages, Clear Tea, Black Coffee Only (NO MILK, CREAM OR POWDERED CREAMER of any kind), and Gatorade.    Take these medicines the morning of surgery with A SIP OF WATER  diltiazem (CARDIZEM CD)  colestipol (COLESTID)  fenofibrate (TRICOR)  pantoprazole (PROTONIX)  tamsulosin [facility])    May take these medicines IF NEEDED: acetaminophen (TYLENOL)  famotidine (PEPCID)  oxyCODONE (OXY IR/ROXICODONE)     One week prior to surgery, STOP taking any Aspirin (unless otherwise instructed by your surgeon) Aleve, Naproxen, Ibuprofen, Motrin, Advil, diclofenac (VOLTAREN), Goody's, BC's, all herbal medications, fish oil, and non-prescription vitamins.                     Do NOT Smoke (Tobacco/Vaping) for 24 hours prior to your procedure.  If you use a CPAP at night, you may bring your mask/headgear for your overnight stay.   You will be asked to remove any contacts, glasses, piercing's, hearing aid's, dentures/partials prior to surgery. Please bring cases for these items if needed.    Patients discharged the day of surgery will not be allowed to drive home, and someone needs to stay with them for 24 hours.  SURGICAL WAITING ROOM VISITATION Patients may have no more than 2  support people in the waiting area - these visitors may rotate.   Pre-op nurse will coordinate an appropriate time for 1 ADULT support person, who may not rotate, to accompany patient in pre-op.  Children under the age of 22 must have an adult with them who is not the patient and must remain in the main waiting area with an adult.  If the patient needs to stay at the hospital during part of their recovery, the visitor guidelines for inpatient rooms apply.  Please refer to the [facility] website for the visitor guidelines for any additional information.   If you received a COVID test during your pre-op visit  it is requested that you wear a mask when out in public, stay away from anyone that may not be feeling well and notify your surgeon if you develop symptoms. If you have been in contact with anyone that has tested positive in the last 10 days please notify you surgeon.      Pre-operative 5 CHG Bathing Instructions   You can play a key role in reducing the risk of infection after surgery. Your skin needs to be as free of germs as possible. You can reduce the number of germs on your skin by washing with CHG (chlorhexidine gluconate) soap before surgery. CHG is an antiseptic soap that kills germs and continues to kill germs even after washing.   DO NOT use if you have an allergy to chlorhexidine/CHG or antibacterial soaps. If your skin becomes reddened or irritated, stop using the CHG  and notify one of our RNs at [phone].   Please shower with the CHG soap starting 4 days before surgery using the following schedule:     Please keep in mind the following:  DO NOT shave, including legs and underarms, starting the day of your first shower.   You may shave your face at any point before/day of surgery.  Place clean sheets on your bed the day you start using CHG soap. Use a clean washcloth (not used since being washed) for each shower. DO NOT sleep with pets once you start using the CHG.    CHG Shower Instructions:  Wash your face and private area with normal soap. If you choose to wash your hair, wash first with your normal shampoo.  After you use shampoo/soap, rinse your hair and body thoroughly to remove shampoo/soap residue.  Turn the water OFF and apply about 3 tablespoons (45 ml) of CHG soap to a CLEAN washcloth.  Apply CHG soap ONLY FROM YOUR NECK DOWN TO YOUR TOES (washing for 3-5 minutes)  DO NOT use CHG soap on face, private areas, open wounds, or sores.  Pay special attention to the area where your surgery is being performed.  If you are having back surgery, having someone wash your back for you may be helpful. Wait 2 minutes after CHG soap is applied, then you may rinse off the CHG soap.  Pat dry with a clean towel  Put on clean clothes/pajamas   If you choose to wear lotion, please use ONLY the CHG-compatible lotions on the back of this paper.   Additional instructions for the day of surgery: DO NOT APPLY any lotions, deodorants, cologne, or perfumes.   Do not bring valuables to the hospital. [facility] is not responsible for any belongings/valuables. Do not wear nail polish, gel polish, artificial nails, or any other type of covering on natural nails (fingers and toes) Do not wear jewelry or makeup Put on clean/comfortable clothes.  Please brush your teeth.  Ask your nurse before applying any prescription medications to the skin.     CHG Compatible Lotions   Aveeno Moisturizing lotion  Cetaphil Moisturizing Cream  Cetaphil Moisturizing Lotion  Clairol Herbal Essence Moisturizing Lotion, Dry Skin  Clairol Herbal Essence Moisturizing Lotion, Extra Dry Skin  Clairol Herbal Essence Moisturizing Lotion, Normal Skin  Curel Age Defying Therapeutic Moisturizing Lotion with Alpha Hydroxy  Curel Extreme Care Body Lotion  Curel Soothing Hands Moisturizing Hand Lotion  Curel Therapeutic Moisturizing Cream, Fragrance-Free  Curel Therapeutic Moisturizing  Lotion, Fragrance-Free  Curel Therapeutic Moisturizing Lotion, Original Formula  Eucerin Daily Replenishing Lotion  Eucerin Dry Skin Therapy Plus Alpha Hydroxy Crme  Eucerin Dry Skin Therapy Plus Alpha Hydroxy Lotion  Eucerin Original Crme  Eucerin Original Lotion  Eucerin Plus Crme Eucerin Plus Lotion  Eucerin TriLipid Replenishing Lotion  Keri Anti-Bacterial Hand Lotion  Keri Deep Conditioning Original Lotion Dry Skin Formula Softly Scented  Keri Deep Conditioning Original Lotion, Fragrance Free Sensitive Skin Formula  Keri Lotion Fast Absorbing Fragrance Free Sensitive Skin Formula  Keri Lotion Fast Absorbing Softly Scented Dry Skin Formula  Keri Original Lotion  Keri Skin Renewal Lotion Keri Silky Smooth Lotion  Keri Silky Smooth Sensitive Skin Lotion  Nivea Body Creamy Conditioning Oil  Nivea Body Extra Enriched Teacher, adult education Moisturizing Lotion Nivea Crme  Nivea Skin Firming Lotion  NutraDerm 30 Skin Lotion  NutraDerm Skin Lotion  NutraDerm Therapeutic Skin Cream  NutraDerm Therapeutic Skin  Lotion  ProShield Protective Hand Cream  Provon moisturizing lotion  Please read over the following fact sheets that you were given.

## 2023-02-09 ENCOUNTER — Encounter (HOSPITAL_COMMUNITY)
Admission: RE | Admit: 2023-02-09 | Discharge: 2023-02-09 | Disposition: A | Discharge status: Home / Self Care | Payer: Medicare HMO | Source: Ambulatory Visit | Attending: Neurosurgery | Admitting: Neurosurgery

## 2023-02-09 ENCOUNTER — Encounter (HOSPITAL_COMMUNITY): Payer: Self-pay

## 2023-02-09 ENCOUNTER — Other Ambulatory Visit: Payer: Self-pay

## 2023-02-09 VITALS — BP 125/58 | HR 59 | Temp 97.8°F | Resp 18 | Ht 68.0 in | Wt 180.7 lb

## 2023-02-09 DIAGNOSIS — Z01818 Encounter for other preprocedural examination: Secondary | ICD-10-CM

## 2023-02-09 DIAGNOSIS — Z01812 Encounter for preprocedural laboratory examination: Secondary | ICD-10-CM | POA: Insufficient documentation

## 2023-02-09 LAB — BASIC METABOLIC PANEL
Anion gap: 9 (ref 5–15)
BUN: 17 mg/dL (ref 8–23)
CO2: 28 mmol/L (ref 22–32)
Calcium: 8.9 mg/dL (ref 8.9–10.3)
Chloride: 95 mmol/L — ABNORMAL LOW (ref 98–111)
Creatinine, Ser: 1.24 mg/dL (ref 0.61–1.24)
GFR, Estimated: >60 mL/min (ref >60)
Glucose, Bld: 111 mg/dL — ABNORMAL HIGH (ref 70–99)
Potassium: 3.2 mmol/L — ABNORMAL LOW (ref 3.5–5.1)
Sodium: 132 mmol/L — ABNORMAL LOW (ref 135–145)

## 2023-02-09 LAB — TYPE AND SCREEN
ABO/RH(D): A POS
Antibody Screen: NEGATIVE

## 2023-02-09 LAB — CBC
HCT: 39.7 % (ref 39.0–52.0)
Hemoglobin: 13.1 g/dL (ref 13.0–17.0)
MCH: 28.4 pg (ref 26.0–34.0)
MCHC: 33 g/dL (ref 30.0–36.0)
MCV: 86.1 fL (ref 80.0–100.0)
Platelets: 289 10*3/uL (ref 150–400)
RBC: 4.61 MIL/uL (ref 4.22–5.81)
RDW: 12.7 % (ref 11.5–15.5)
WBC: 8.1 10*3/uL (ref 4.0–10.5)
nRBC: 0 % (ref 0.0–0.2)

## 2023-02-09 LAB — SURGICAL PCR SCREEN
MRSA, PCR: NEGATIVE
Staphylococcus aureus: NEGATIVE

## 2023-02-09 NOTE — Progress Notes (Signed)
PCP - Kurtis Bushman, FNP Cardiologist -   PPM/ICD - denies Device Orders - n/a Rep Notified - n/a  Chest x-ray - n/a EKG - 09/24/2022 Stress Test - 02/12/2022 ECHO - 01/29/2022 Cardiac Cath -   Sleep Study - diagnosed with OSA CPAP - nightly  Non-diabetic Last dose of GLP1 agonist-  n/a GLP1 instructions: n/a  Blood Thinner Instructions: denies Aspirin Instructions:denies  ERAS Protcol - clear fluids until 1045  COVID TEST- n/a   Anesthesia review: Yes.   HTN, OSA with CPAP, heart murmur.   Patient denies shortness of breath, fever, cough and chest pain at PAT appointment   All instructions explained to the patient, with a verbal understanding of the material. Patient agrees to go over the instructions while at home for a better understanding. Patient also instructed to self quarantine after being tested for COVID-19. The opportunity to ask questions was provided.

## 2023-02-11 ENCOUNTER — Ambulatory Visit (HOSPITAL_COMMUNITY): Payer: Medicare HMO | Attending: Cardiology

## 2023-02-11 DIAGNOSIS — I340 Nonrheumatic mitral (valve) insufficiency: Secondary | ICD-10-CM | POA: Diagnosis present

## 2023-02-11 LAB — ECHOCARDIOGRAM COMPLETE
Area-P 1/2: 2.66 cm2
S' Lateral: 2.4 cm

## 2023-02-11 NOTE — Progress Notes (Signed)
Anesthesia Chart Review:  77 yo male follows with cardiology for hx of HLD, HTN, mitral regurgitation, OSA on CPAP, frequent PVCs. Stress test 02/12/22 was low risk. Cleared by Dr. Odis Hollingshead in letter dated 01/28/23, "Brett [name] is at acceptable risk, from a cardiac standpoint, for his upcoming surgery: L3/L4 lumbar fusion."  Former smoker, 102 pack years, quit 2012.  Hiatal hernia and GERD, maintained on pantoprazole and famotidine.   Preop labs reviewed, mild hyponatremia sodium 132, mild hypokalemia potassium 3.2, otherwise unremarkable.   EKG 09/24/22: Sinus bradycardia. Rate 56.  TTE 02/11/23:  1. Left ventricular ejection fraction, by estimation, is 55 to 60%. The  left ventricle has normal function. The left ventricle has no regional  wall motion abnormalities. Left ventricular diastolic parameters are  consistent with Grade I diastolic  dysfunction (impaired relaxation).   2. Right ventricular systolic function is normal. The right ventricular  size is normal. There is normal pulmonary artery systolic pressure. The  estimated right ventricular systolic pressure is 33.0 mmHg.   3. The mitral valve is normal in structure. Mild mitral valve  regurgitation. No evidence of mitral stenosis.   4. The aortic valve is tricuspid. There is mild calcification of the  aortic valve. Aortic valve regurgitation is trivial. No aortic stenosis is  present.   5. The inferior vena cava is normal in size with greater than 50%  respiratory variability, suggesting right atrial pressure of 3 mmHg.   Exercise treadmill stress test 02/12/2022: Exercise treadmill stress test performed using Bruce protocol.  Patient reached 8.8 METS, and 87% of age predicted maximum heart rate.  Exercise capacity was good.  No chest pain reported.  Normal heart rate and hemodynamic response. Stress EKG revealed no ischemic changes. Low risk study.   CT Cardiac Scoring: October 22, 2022 1. Coronary calcium score of 56. This was  25th percentile for age,gender, and race matched controls.  2. Aortic atherosclerosis.  Noncardiac findings: No significant extracardiac findings within the visualized chest.    Zio patch April 2024   Predominant rhythm was normal sinus rhythm with average heart rate 66bpm and ranged from 47 to 141bpm.   Rare PCAs, atrial couplets and atrial triplets   Occasional PVCs (PVC load 3.8%).  rare ventricular couplets, ventricular bigeminy and trigeminy   Nonsustained atrial tachycardia up to 11.4 seconds with fastest heart rate 190bpm   Brett [name] [facility] Short Stay Center/Anesthesiology Phone [phone] 02/11/2023 4:29 PM

## 2023-02-11 NOTE — Anesthesia Preprocedure Evaluation (Addendum)
Anesthesia Evaluation  Patient identified by MRN, date of birth, ID band Patient awake    Reviewed: Allergy & Precautions, NPO status , Patient's Chart, lab work & pertinent test results  History of Anesthesia Complications (+) PONV and history of anesthetic complications  Airway Mallampati: III  TM Distance: >3 FB Neck ROM: Full    Dental  (+) Teeth Intact, Dental Advisory Given   Pulmonary neg shortness of breath, sleep apnea and Continuous Positive Airway Pressure Ventilation , neg COPD, neg recent URI, former smoker   breath sounds clear to auscultation       Cardiovascular hypertension, Pt. on medications (-) angina (-) Past MI and (-) CHF + Valvular Problems/Murmurs  Rhythm:Regular  1. Left ventricular ejection fraction, by estimation, is 55 to 60%. The  left ventricle has normal function. The left ventricle has no regional  wall motion abnormalities. Left ventricular diastolic parameters are  consistent with Grade I diastolic  dysfunction (impaired relaxation).   2. Right ventricular systolic function is normal. The right ventricular  size is normal. There is normal pulmonary artery systolic pressure. The  estimated right ventricular systolic pressure is 33.0 mmHg.   3. The mitral valve is normal in structure. Mild mitral valve  regurgitation. No evidence of mitral stenosis.   4. The aortic valve is tricuspid. There is mild calcification of the  aortic valve. Aortic valve regurgitation is trivial. No aortic stenosis is  present.   5. The inferior vena cava is normal in size with greater than 50%  respiratory variability, suggesting right atrial pressure of 3 mmHg.      Neuro/Psych neg Seizures  Neuromuscular disease    GI/Hepatic Neg liver ROS, hiatal hernia,GERD  Medicated and Controlled,,  Endo/Other  negative endocrine ROS    Renal/GU negative Renal ROSLab Results      Component                Value                Date                      NA                       132 (L)             02/09/2023                K                        3.2 (L)             02/09/2023                CO2                      28                  02/09/2023                GLUCOSE                  111 (H)             02/09/2023                BUN  17                  02/09/2023                CREATININE               1.24                02/09/2023                CALCIUM                  8.9                 02/09/2023                EGFR                     65                  01/03/2023                GFRNONAA                 >60                 02/09/2023                Musculoskeletal  (+) Arthritis ,    Abdominal   Peds  Hematology Lab Results      Component                Value               Date                      WBC                      8.1                 02/09/2023                HGB                      13.1                02/09/2023                HCT                      39.7                02/09/2023                MCV                      86.1                02/09/2023                PLT                      289                 02/09/2023              Anesthesia Other Findings   Reproductive/Obstetrics  Anesthesia Physical Anesthesia Plan  ASA: 2  Anesthesia Plan: General   Post-op Pain Management: Ofirmev IV (intra-op)*   Induction: Intravenous  PONV Risk Score and Plan: 4 or greater and Ondansetron and Dexamethasone  Airway Management Planned: Oral ETT  Additional Equipment: None  Intra-op Plan:   Post-operative Plan: Extubation in OR  Informed Consent: I have reviewed the patients History and Physical, chart, labs and discussed the procedure including the risks, benefits and alternatives for the proposed anesthesia with the patient or authorized representative who has indicated his/her understanding and  acceptance.     Dental advisory given  Plan Discussed with: CRNA  Anesthesia Plan Comments: (PAT note by Antionette Poles, PA-C:  77 yo male follows with cardiology for hx of HLD, HTN, mitral regurgitation, OSA on CPAP, frequent PVCs. Stress test 02/12/22 was low risk. Cleared by Dr. Odis Hollingshead in letter dated 01/28/23, "Brett [name] is at acceptable risk, from a cardiac standpoint, for his upcoming surgery: L3/L4 lumbar fusion."  Former smoker, 102 pack years, quit 2012.  Hiatal hernia and GERD, maintained on pantoprazole and famotidine.   Preop labs reviewed, mild hyponatremia sodium 132, mild hypokalemia potassium 3.2, otherwise unremarkable.   EKG 09/24/22: Sinus bradycardia. Rate 56.  TTE 02/11/23:  1. Left ventricular ejection fraction, by estimation, is 55 to 60%. The  left ventricle has normal function. The left ventricle has no regional  wall motion abnormalities. Left ventricular diastolic parameters are  consistent with Grade I diastolic  dysfunction (impaired relaxation).   2. Right ventricular systolic function is normal. The right ventricular  size is normal. There is normal pulmonary artery systolic pressure. The  estimated right ventricular systolic pressure is 33.0 mmHg.   3. The mitral valve is normal in structure. Mild mitral valve  regurgitation. No evidence of mitral stenosis.   4. The aortic valve is tricuspid. There is mild calcification of the  aortic valve. Aortic valve regurgitation is trivial. No aortic stenosis is  present.   5. The inferior vena cava is normal in size with greater than 50%  respiratory variability, suggesting right atrial pressure of 3 mmHg.   Exercise treadmill stress test 02/12/2022: Exercise treadmill stress test performed using Bruce protocol.  Patient reached 8.8 METS, and 87% of age predicted maximum heart rate.  Exercise capacity was good.  No chest pain reported.  Normal heart rate and hemodynamic response. Stress EKG revealed no ischemic  changes. Low risk study.   CT Cardiac Scoring: October 22, 2022 1. Coronary calcium score of 56. This was 25th percentile for age,gender, and race matched controls.  2. Aortic atherosclerosis.  Noncardiac findings: No significant extracardiac findings within the visualized chest.    Zio patch April 2024   Predominant rhythm was normal sinus rhythm with average heart rate 66bpm and ranged from 47 to 141bpm.   Rare PCAs, atrial couplets and atrial triplets   Occasional PVCs (PVC load 3.8%).  rare ventricular couplets, ventricular bigeminy and trigeminy   Nonsustained atrial tachycardia up to 11.4 seconds with fastest heart rate 190bpm  )         Anesthesia Quick Evaluation

## 2023-02-18 ENCOUNTER — Encounter: Payer: Self-pay | Admitting: Family Medicine

## 2023-02-22 ENCOUNTER — Other Ambulatory Visit: Payer: Self-pay | Admitting: Family Medicine

## 2023-02-22 ENCOUNTER — Other Ambulatory Visit: Payer: Self-pay

## 2023-02-22 MED ORDER — COLESTIPOL HCL 1 G PO TABS: 1.0000 g | ORAL_TABLET | Freq: Two times a day (BID) | ORAL | 3 refills | Status: DC

## 2023-02-22 NOTE — Progress Notes (Signed)
OK to continue BID per GI 09/30/2022 for bowel function

## 2023-02-23 ENCOUNTER — Other Ambulatory Visit: Payer: Medicare HMO

## 2023-02-23 NOTE — Progress Notes (Signed)
Notified patient that he needs to check in at [facility] admitting office at 7:00am on October 11 for his surgery which starts at 9:00am. Pt instructed to stop clear liquids at 6:00am . Pt is to follow other  written instructions given to him at PAT visit. Mr. Bara verbalized understanding and asked no further questions.

## 2023-02-25 ENCOUNTER — Ambulatory Visit (HOSPITAL_BASED_OUTPATIENT_CLINIC_OR_DEPARTMENT_OTHER): Payer: Medicare HMO | Admitting: Physician Assistant

## 2023-02-25 ENCOUNTER — Encounter: Payer: Self-pay | Admitting: Family Medicine

## 2023-02-25 ENCOUNTER — Ambulatory Visit (HOSPITAL_COMMUNITY): Payer: Medicare HMO | Admitting: Physician Assistant

## 2023-02-25 ENCOUNTER — Other Ambulatory Visit: Payer: Self-pay

## 2023-02-25 ENCOUNTER — Ambulatory Visit (HOSPITAL_COMMUNITY): Payer: Medicare HMO

## 2023-02-25 ENCOUNTER — Encounter (HOSPITAL_COMMUNITY)
Admission: RE | Disposition: A | Discharge status: Home / Self Care | Payer: Self-pay | Source: Ambulatory Visit | Attending: Neurosurgery

## 2023-02-25 ENCOUNTER — Ambulatory Visit (HOSPITAL_COMMUNITY)
Admission: RE | Admit: 2023-02-25 | Discharge: 2023-02-26 | Disposition: A | Discharge status: Home / Self Care | Payer: Medicare HMO | Source: Ambulatory Visit | Attending: Neurosurgery | Admitting: Neurosurgery

## 2023-02-25 ENCOUNTER — Encounter (HOSPITAL_COMMUNITY): Payer: Self-pay | Admitting: Neurosurgery

## 2023-02-25 DIAGNOSIS — M51369 Other intervertebral disc degeneration, lumbar region without mention of lumbar back pain or lower extremity pain: Secondary | ICD-10-CM

## 2023-02-25 DIAGNOSIS — G4730 Sleep apnea, unspecified: Secondary | ICD-10-CM | POA: Insufficient documentation

## 2023-02-25 DIAGNOSIS — I340 Nonrheumatic mitral (valve) insufficiency: Secondary | ICD-10-CM | POA: Insufficient documentation

## 2023-02-25 DIAGNOSIS — Z85828 Personal history of other malignant neoplasm of skin: Secondary | ICD-10-CM | POA: Insufficient documentation

## 2023-02-25 DIAGNOSIS — I10 Essential (primary) hypertension: Secondary | ICD-10-CM | POA: Diagnosis not present

## 2023-02-25 DIAGNOSIS — K219 Gastro-esophageal reflux disease without esophagitis: Secondary | ICD-10-CM | POA: Insufficient documentation

## 2023-02-25 DIAGNOSIS — Z87891 Personal history of nicotine dependence: Secondary | ICD-10-CM | POA: Diagnosis not present

## 2023-02-25 DIAGNOSIS — M48061 Spinal stenosis, lumbar region without neurogenic claudication: Secondary | ICD-10-CM

## 2023-02-25 DIAGNOSIS — M4316 Spondylolisthesis, lumbar region: Secondary | ICD-10-CM | POA: Diagnosis present

## 2023-02-25 LAB — CBC
HCT: 36.1 % — ABNORMAL LOW (ref 39.0–52.0)
Hemoglobin: 12.1 g/dL — ABNORMAL LOW (ref 13.0–17.0)
MCH: 28.1 pg (ref 26.0–34.0)
MCHC: 33.5 g/dL (ref 30.0–36.0)
MCV: 84 fL (ref 80.0–100.0)
Platelets: 308 10*3/uL (ref 150–400)
RBC: 4.3 MIL/uL (ref 4.22–5.81)
RDW: 12.7 % (ref 11.5–15.5)
WBC: 12.7 10*3/uL — ABNORMAL HIGH (ref 4.0–10.5)
nRBC: 0 % (ref 0.0–0.2)

## 2023-02-25 LAB — CREATININE, SERUM
Creatinine, Ser: 1.19 mg/dL (ref 0.61–1.24)
GFR, Estimated: >60 mL/min (ref >60)

## 2023-02-25 SURGERY — POSTERIOR LUMBAR FUSION 1 LEVEL
Anesthesia: General

## 2023-02-25 MED ORDER — SENNOSIDES-DOCUSATE SODIUM 8.6-50 MG PO TABS: 1.0000 | ORAL_TABLET | Freq: Every evening | ORAL | Status: DC | PRN

## 2023-02-25 MED ORDER — ACETAMINOPHEN 500 MG PO TABS
1000.0000 mg | ORAL_TABLET | Freq: Four times a day (QID) | ORAL | Status: DC
  Administered 2023-02-25 – 2023-02-26 (×2): 1000 mg via ORAL
  Filled 2023-02-25 (×2): qty 2

## 2023-02-25 MED ORDER — HYDROCODONE-ACETAMINOPHEN 5-325 MG PO TABS: 1.0000 | ORAL_TABLET | ORAL | Status: DC | PRN

## 2023-02-25 MED ORDER — PHENYLEPHRINE 80 MCG/ML (10ML) SYRINGE FOR IV PUSH (FOR BLOOD PRESSURE SUPPORT)
PREFILLED_SYRINGE | INTRAVENOUS | Status: DC | PRN
  Administered 2023-02-25 (×4): 80 ug via INTRAVENOUS

## 2023-02-25 MED ORDER — LOSARTAN POTASSIUM 50 MG PO TABS
100.0000 mg | ORAL_TABLET | Freq: Every evening | ORAL | Status: DC
  Filled 2023-02-25: qty 2

## 2023-02-25 MED ORDER — THROMBIN 20000 UNITS EX SOLR
CUTANEOUS | Status: AC
  Filled 2023-02-25: qty 20000

## 2023-02-25 MED ORDER — CHLORHEXIDINE GLUCONATE CLOTH 2 % EX PADS: 6.0000 | MEDICATED_PAD | Freq: Once | CUTANEOUS | Status: DC

## 2023-02-25 MED ORDER — ACETAMINOPHEN 500 MG PO TABS: 1000.0000 mg | ORAL_TABLET | Freq: Once | ORAL | Status: DC | PRN

## 2023-02-25 MED ORDER — PANTOPRAZOLE SODIUM 40 MG PO TBEC
40.0000 mg | DELAYED_RELEASE_TABLET | Freq: Two times a day (BID) | ORAL | Status: DC
  Administered 2023-02-25: 40 mg via ORAL
  Filled 2023-02-25 (×2): qty 1

## 2023-02-25 MED ORDER — DIAZEPAM 5 MG PO TABS
5.0000 mg | ORAL_TABLET | Freq: Four times a day (QID) | ORAL | Status: DC | PRN
  Filled 2023-02-25: qty 1

## 2023-02-25 MED ORDER — OXYCODONE HCL 5 MG PO TABS: 5.0000 mg | ORAL_TABLET | Freq: Once | ORAL | Status: DC | PRN

## 2023-02-25 MED ORDER — POTASSIUM CHLORIDE IN NACL 20-0.9 MEQ/L-% IV SOLN: INTRAVENOUS | Status: DC

## 2023-02-25 MED ORDER — CEFAZOLIN SODIUM-DEXTROSE 2-4 GM/100ML-% IV SOLN
2.0000 g | INTRAVENOUS | Status: AC
  Administered 2023-02-25: 2 g via INTRAVENOUS

## 2023-02-25 MED ORDER — LIDOCAINE HCL (CARDIAC) PF 100 MG/5ML IV SOSY
PREFILLED_SYRINGE | INTRAVENOUS | Status: DC | PRN
Start: 2023-02-25 — End: 2023-02-25
  Administered 2023-02-25: 60 mg via INTRATRACHEAL

## 2023-02-25 MED ORDER — FAMOTIDINE 20 MG PO TABS
20.0000 mg | ORAL_TABLET | Freq: Every day | ORAL | Status: DC | PRN
  Administered 2023-02-26: 20 mg via ORAL
  Filled 2023-02-25: qty 1

## 2023-02-25 MED ORDER — EPHEDRINE SULFATE-NACL 50-0.9 MG/10ML-% IV SOSY
PREFILLED_SYRINGE | INTRAVENOUS | Status: DC | PRN
  Administered 2023-02-25 (×2): 5 mg via INTRAVENOUS

## 2023-02-25 MED ORDER — FENTANYL CITRATE (PF) 100 MCG/2ML IJ SOLN
25.0000 ug | INTRAMUSCULAR | Status: DC | PRN
  Administered 2023-02-25: 50 ug via INTRAVENOUS

## 2023-02-25 MED ORDER — FENTANYL CITRATE (PF) 100 MCG/2ML IJ SOLN
INTRAMUSCULAR | Status: AC
  Filled 2023-02-25: qty 2

## 2023-02-25 MED ORDER — FENTANYL CITRATE (PF) 250 MCG/5ML IJ SOLN
INTRAMUSCULAR | Status: AC
  Filled 2023-02-25: qty 5

## 2023-02-25 MED ORDER — OXYCODONE HCL 5 MG/5ML PO SOLN: 5.0000 mg | Freq: Once | ORAL | Status: DC | PRN

## 2023-02-25 MED ORDER — LACTATED RINGERS IV SOLN: INTRAVENOUS | Status: AC

## 2023-02-25 MED ORDER — ALBUMIN HUMAN 5 % IV SOLN
INTRAVENOUS | Status: DC | PRN
Start: 2023-02-25 — End: 2023-02-25

## 2023-02-25 MED ORDER — BUPIVACAINE HCL (PF) 0.5 % IJ SOLN
INTRAMUSCULAR | Status: AC
  Filled 2023-02-25: qty 30

## 2023-02-25 MED ORDER — CEFAZOLIN SODIUM-DEXTROSE 2-4 GM/100ML-% IV SOLN
INTRAVENOUS | Status: AC
  Filled 2023-02-25: qty 100

## 2023-02-25 MED ORDER — ACETAMINOPHEN 10 MG/ML IV SOLN: 1000.0000 mg | Freq: Once | INTRAVENOUS | Status: DC | PRN

## 2023-02-25 MED ORDER — BISACODYL 5 MG PO TBEC: 5.0000 mg | DELAYED_RELEASE_TABLET | Freq: Every day | ORAL | Status: DC | PRN

## 2023-02-25 MED ORDER — PHENYLEPHRINE HCL-NACL 20-0.9 MG/250ML-% IV SOLN
INTRAVENOUS | Status: DC | PRN
  Administered 2023-02-25: 30 ug/min via INTRAVENOUS

## 2023-02-25 MED ORDER — PROPOFOL 10 MG/ML IV BOLUS
INTRAVENOUS | Status: AC
  Filled 2023-02-25: qty 20

## 2023-02-25 MED ORDER — ACETAMINOPHEN 160 MG/5ML PO SOLN: 1000.0000 mg | Freq: Once | ORAL | Status: DC | PRN

## 2023-02-25 MED ORDER — BUPIVACAINE HCL (PF) 0.5 % IJ SOLN
INTRAMUSCULAR | Status: DC | PRN
  Administered 2023-02-25: 30 mL

## 2023-02-25 MED ORDER — THROMBIN 20000 UNITS EX SOLR
CUTANEOUS | Status: DC | PRN
  Administered 2023-02-25: 20 mL via TOPICAL

## 2023-02-25 MED ORDER — MAGNESIUM CITRATE PO SOLN: 1.0000 | Freq: Once | ORAL | Status: DC | PRN

## 2023-02-25 MED ORDER — ONDANSETRON HCL 4 MG PO TABS
4.0000 mg | ORAL_TABLET | Freq: Four times a day (QID) | ORAL | Status: DC | PRN
  Administered 2023-02-25 – 2023-02-26 (×2): 4 mg via ORAL
  Filled 2023-02-25 (×2): qty 1

## 2023-02-25 MED ORDER — CHLORTHALIDONE 25 MG PO TABS
25.0000 mg | ORAL_TABLET | Freq: Every morning | ORAL | Status: DC
  Filled 2023-02-25: qty 1

## 2023-02-25 MED ORDER — ACETAMINOPHEN 650 MG RE SUPP: 650.0000 mg | RECTAL | Status: DC | PRN

## 2023-02-25 MED ORDER — FENOFIBRATE 54 MG PO TABS
54.0000 mg | ORAL_TABLET | Freq: Every day | ORAL | Status: DC
  Filled 2023-02-25: qty 1

## 2023-02-25 MED ORDER — SENNA 8.6 MG PO TABS
1.0000 | ORAL_TABLET | Freq: Two times a day (BID) | ORAL | Status: DC
  Administered 2023-02-25 – 2023-02-26 (×3): 8.6 mg via ORAL
  Filled 2023-02-25 (×3): qty 1

## 2023-02-25 MED ORDER — SODIUM CHLORIDE 0.9% FLUSH: 3.0000 mL | INTRAVENOUS | Status: DC | PRN

## 2023-02-25 MED ORDER — CHLORHEXIDINE GLUCONATE 0.12 % MT SOLN
OROMUCOSAL | Status: AC
  Administered 2023-02-25: 15 mL via OROMUCOSAL
  Filled 2023-02-25: qty 15

## 2023-02-25 MED ORDER — PHENOL 1.4 % MT LIQD: 1.0000 | OROMUCOSAL | Status: DC | PRN

## 2023-02-25 MED ORDER — MENTHOL 3 MG MT LOZG: 1.0000 | LOZENGE | OROMUCOSAL | Status: DC | PRN

## 2023-02-25 MED ORDER — BACLOFEN 10 MG PO TABS
10.0000 mg | ORAL_TABLET | Freq: Every day | ORAL | Status: DC
  Administered 2023-02-25: 10 mg via ORAL
  Filled 2023-02-25: qty 1

## 2023-02-25 MED ORDER — CHLORHEXIDINE GLUCONATE 0.12 % MT SOLN: 15.0000 mL | Freq: Once | OROMUCOSAL | Status: AC

## 2023-02-25 MED ORDER — PROPOFOL 10 MG/ML IV BOLUS
INTRAVENOUS | Status: DC | PRN
Start: 2023-02-25 — End: 2023-02-25
  Administered 2023-02-25: 110 mg via INTRAVENOUS

## 2023-02-25 MED ORDER — SUGAMMADEX SODIUM 200 MG/2ML IV SOLN
INTRAVENOUS | Status: DC | PRN
  Administered 2023-02-25: 163.2 mg via INTRAVENOUS

## 2023-02-25 MED ORDER — COLESTIPOL HCL 1 G PO TABS
1.0000 g | ORAL_TABLET | Freq: Two times a day (BID) | ORAL | Status: DC
  Administered 2023-02-25: 1 g via ORAL
  Filled 2023-02-25 (×2): qty 1

## 2023-02-25 MED ORDER — SODIUM CHLORIDE 0.9% FLUSH
3.0000 mL | Freq: Two times a day (BID) | INTRAVENOUS | Status: DC
  Administered 2023-02-25: 3 mL via INTRAVENOUS

## 2023-02-25 MED ORDER — FENTANYL CITRATE (PF) 250 MCG/5ML IJ SOLN
INTRAMUSCULAR | Status: DC | PRN
Start: 2023-02-25 — End: 2023-02-25
  Administered 2023-02-25 (×2): 50 ug via INTRAVENOUS
  Administered 2023-02-25: 100 ug via INTRAVENOUS

## 2023-02-25 MED ORDER — DILTIAZEM HCL ER COATED BEADS 180 MG PO CP24
180.0000 mg | ORAL_CAPSULE | Freq: Every morning | ORAL | Status: DC
  Filled 2023-02-25: qty 1

## 2023-02-25 MED ORDER — LIDOCAINE-EPINEPHRINE 0.5 %-1:200000 IJ SOLN
INTRAMUSCULAR | Status: AC
  Filled 2023-02-25: qty 50

## 2023-02-25 MED ORDER — ONDANSETRON HCL 4 MG/2ML IJ SOLN
INTRAMUSCULAR | Status: DC | PRN
  Administered 2023-02-25: 4 mg via INTRAVENOUS

## 2023-02-25 MED ORDER — OXYCODONE HCL 5 MG PO TABS
5.0000 mg | ORAL_TABLET | ORAL | Status: DC | PRN
  Administered 2023-02-25 – 2023-02-26 (×3): 5 mg via ORAL
  Filled 2023-02-25 (×3): qty 1

## 2023-02-25 MED ORDER — DEXAMETHASONE SODIUM PHOSPHATE 10 MG/ML IJ SOLN
INTRAMUSCULAR | Status: DC | PRN
  Administered 2023-02-25: 10 mg via INTRAVENOUS

## 2023-02-25 MED ORDER — MORPHINE SULFATE (PF) 2 MG/ML IV SOLN: 2.0000 mg | INTRAVENOUS | Status: DC | PRN

## 2023-02-25 MED ORDER — ONDANSETRON HCL 4 MG/2ML IJ SOLN
4.0000 mg | Freq: Four times a day (QID) | INTRAMUSCULAR | Status: DC | PRN
  Administered 2023-02-26: 4 mg via INTRAVENOUS
  Filled 2023-02-25: qty 2

## 2023-02-25 MED ORDER — SODIUM CHLORIDE 0.9 % IV SOLN: 250.0000 mL | INTRAVENOUS | Status: DC

## 2023-02-25 MED ORDER — EZETIMIBE 10 MG PO TABS
10.0000 mg | ORAL_TABLET | Freq: Every day | ORAL | Status: DC
  Administered 2023-02-25: 10 mg via ORAL
  Filled 2023-02-25: qty 1

## 2023-02-25 MED ORDER — METHOCARBAMOL 500 MG PO TABS
500.0000 mg | ORAL_TABLET | Freq: Four times a day (QID) | ORAL | Status: DC | PRN
  Filled 2023-02-25: qty 1

## 2023-02-25 MED ORDER — 0.9 % SODIUM CHLORIDE (POUR BTL) OPTIME
TOPICAL | Status: DC | PRN
  Administered 2023-02-25: 1000 mL

## 2023-02-25 MED ORDER — ACETAMINOPHEN 325 MG PO TABS: 650.0000 mg | ORAL_TABLET | ORAL | Status: DC | PRN

## 2023-02-25 MED ORDER — OXYCODONE HCL ER 10 MG PO T12A
10.0000 mg | EXTENDED_RELEASE_TABLET | Freq: Two times a day (BID) | ORAL | Status: DC
  Filled 2023-02-25: qty 1

## 2023-02-25 MED ORDER — ZOLPIDEM TARTRATE 5 MG PO TABS
5.0000 mg | ORAL_TABLET | Freq: Every evening | ORAL | Status: DC | PRN
  Administered 2023-02-25: 5 mg via ORAL
  Filled 2023-02-25: qty 1

## 2023-02-25 MED ORDER — OXYCODONE HCL 5 MG PO TABS: 10.0000 mg | ORAL_TABLET | ORAL | Status: DC | PRN

## 2023-02-25 MED ORDER — ROCURONIUM BROMIDE 100 MG/10ML IV SOLN
INTRAVENOUS | Status: DC | PRN
Start: 2023-02-25 — End: 2023-02-25
  Administered 2023-02-25 (×2): 20 mg via INTRAVENOUS
  Administered 2023-02-25: 10 mg via INTRAVENOUS
  Administered 2023-02-25: 20 mg via INTRAVENOUS
  Administered 2023-02-25: 70 mg via INTRAVENOUS

## 2023-02-25 MED ORDER — HEPARIN SODIUM (PORCINE) 5000 UNIT/ML IJ SOLN: 5000.0000 [IU] | Freq: Three times a day (TID) | INTRAMUSCULAR | Status: DC

## 2023-02-25 MED ORDER — ORAL CARE MOUTH RINSE: 15.0000 mL | Freq: Once | OROMUCOSAL | Status: AC

## 2023-02-25 SURGICAL SUPPLY — 61 items
ADH SKN CLS APL DERMABOND .7 (GAUZE/BANDAGES/DRESSINGS) ×1
APL SKNCLS STERI-STRIP NONHPOA (GAUZE/BANDAGES/DRESSINGS)
BAG COUNTER SPONGE SURGICOUNT (BAG) ×2 IMPLANT
BAG SPNG CNTER NS LX DISP (BAG) ×1
BASKET BONE COLLECTION (BASKET) ×2 IMPLANT
BENZOIN TINCTURE PRP APPL 2/3 (GAUZE/BANDAGES/DRESSINGS) IMPLANT
BLADE BONE MILL MEDIUM (MISCELLANEOUS) ×2 IMPLANT
BLADE CLIPPER SURG (BLADE) IMPLANT
BUR MATCHSTICK NEURO 3.0 LAGG (BURR) ×2 IMPLANT
BUR PRECISION FLUTE 5.0 (BURR) ×2 IMPLANT
CANISTER SUCT 3000ML PPV (MISCELLANEOUS) ×2 IMPLANT
CAP RELINE MOD TULIP RMM (Cap) IMPLANT
CNTNR URN SCR LID CUP LEK RST (MISCELLANEOUS) ×2 IMPLANT
CONT SPEC 4OZ STRL OR WHT (MISCELLANEOUS) ×1
COVER BACK TABLE 60X90IN (DRAPES) ×2 IMPLANT
DERMABOND ADVANCED .7 DNX12 (GAUZE/BANDAGES/DRESSINGS) ×2 IMPLANT
DRAPE C-ARM 42X72 X-RAY (DRAPES) ×4 IMPLANT
DRAPE C-ARMOR (DRAPES) IMPLANT
DRAPE LAPAROTOMY 100X72X124 (DRAPES) ×2 IMPLANT
DRAPE SURG 17X23 STRL (DRAPES) ×2 IMPLANT
DURAPREP 26ML APPLICATOR (WOUND CARE) ×2 IMPLANT
ELECT REM PT RETURN 9FT ADLT (ELECTROSURGICAL) ×1
ELECTRODE REM PT RTRN 9FT ADLT (ELECTROSURGICAL) ×2 IMPLANT
GAUZE 4X4 16PLY ~~LOC~~+RFID DBL (SPONGE) IMPLANT
GAUZE SPONGE 4X4 12PLY STRL (GAUZE/BANDAGES/DRESSINGS) IMPLANT
GLOVE EXAM NITRILE XL STR (GLOVE) IMPLANT
GLOVE SURG LTX SZ6.5 (GLOVE) ×4 IMPLANT
GOWN STRL REUS W/ TWL LRG LVL3 (GOWN DISPOSABLE) ×4 IMPLANT
GOWN STRL REUS W/ TWL XL LVL3 (GOWN DISPOSABLE) IMPLANT
GOWN STRL REUS W/TWL 2XL LVL3 (GOWN DISPOSABLE) IMPLANT
GOWN STRL REUS W/TWL LRG LVL3 (GOWN DISPOSABLE) ×2
GOWN STRL REUS W/TWL XL LVL3 (GOWN DISPOSABLE) ×2
KIT BASIN OR (CUSTOM PROCEDURE TRAY) ×2 IMPLANT
KIT POSITION SURG JACKSON T1 (MISCELLANEOUS) ×2 IMPLANT
KIT TURNOVER KIT B (KITS) ×2 IMPLANT
MILL BONE PREP (MISCELLANEOUS) IMPLANT
NDL HYPO 25X1 1.5 SAFETY (NEEDLE) ×2 IMPLANT
NDL SPNL 18GX3.5 QUINCKE PK (NEEDLE) IMPLANT
NEEDLE HYPO 25X1 1.5 SAFETY (NEEDLE) ×1 IMPLANT
NEEDLE SPNL 18GX3.5 QUINCKE PK (NEEDLE) IMPLANT
NS IRRIG 1000ML POUR BTL (IV SOLUTION) ×2 IMPLANT
PACK LAMINECTOMY NEURO (CUSTOM PROCEDURE TRAY) ×2 IMPLANT
PAD ARMBOARD 7.5X6 YLW CONV (MISCELLANEOUS) ×4 IMPLANT
ROD RELINE COCR LORD 5.0X65 (Rod) IMPLANT
SCREW LOCK RSS 4.5/5.0MM (Screw) IMPLANT
SCREW SHANK RELINE MOD 6.5X35 (Screw) IMPLANT
SPACER HALF DOME 9X22X9.5 10D (Spacer) IMPLANT
SPACER HALF DOME 9X24X9.5 10D (Spacer) IMPLANT
SPIKE FLUID TRANSFER (MISCELLANEOUS) ×2 IMPLANT
SPONGE SURGIFOAM ABS GEL 100 (HEMOSTASIS) ×2 IMPLANT
SPONGE T-LAP 4X18 ~~LOC~~+RFID (SPONGE) IMPLANT
STRIP CLOSURE SKIN 1/2X4 (GAUZE/BANDAGES/DRESSINGS) IMPLANT
SUT PROLENE 6 0 BV (SUTURE) IMPLANT
SUT VIC AB 0 CT1 18XCR BRD8 (SUTURE) ×2 IMPLANT
SUT VIC AB 0 CT1 8-18 (SUTURE) ×3
SUT VIC AB 2-0 CT1 18 (SUTURE) ×2 IMPLANT
SUT VIC AB 3-0 SH 8-18 (SUTURE) ×2 IMPLANT
TOWEL GREEN STERILE (TOWEL DISPOSABLE) ×2 IMPLANT
TOWEL GREEN STERILE FF (TOWEL DISPOSABLE) ×2 IMPLANT
TRAY FOLEY MTR SLVR 16FR STAT (SET/KITS/TRAYS/PACK) ×2 IMPLANT
WATER STERILE IRR 1000ML POUR (IV SOLUTION) ×2 IMPLANT

## 2023-02-25 NOTE — Anesthesia Procedure Notes (Signed)
Procedure Name: Intubation Date/Time: 02/25/2023 9:52 AM  Performed by: Hessie Diener, CRNAPre-anesthesia Checklist: Patient identified, Emergency Drugs available, Suction available and Patient being monitored Patient Re-evaluated:Patient Re-evaluated prior to induction Oxygen Delivery Method: Circle System Utilized Preoxygenation: Pre-oxygenation with 100% oxygen Induction Type: IV induction Ventilation: Mask ventilation without difficulty Tube type: Oral Number of attempts: 1 Airway Equipment and Method: Stylet and Oral airway Placement Confirmation: ETT inserted through vocal cords under direct vision, positive ETCO2 and breath sounds checked- equal and bilateral Tube secured with: Tape Dental Injury: Teeth and Oropharynx as per pre-operative assessment

## 2023-02-25 NOTE — Transfer of Care (Signed)
Immediate Anesthesia Transfer of Care Note  [name]  Procedure(s) Performed: Posterior Lumbar Interbody Fusion - Posterior Lateral and Interbody fusion Lumbar three-four  Patient Location: PACU  Anesthesia Type:General  Level of Consciousness: awake  Airway & Oxygen Therapy: Patient Spontanous Breathing  Post-op Assessment: Report given to RN  Post vital signs: Reviewed and stable  Last Vitals:  Vitals Value Taken Time  BP 111/55 02/25/23 1436  Temp    Pulse 71 02/25/23 1439  Resp 14 02/25/23 1438  SpO2 96 % 02/25/23 1439  Vitals shown include unfiled device data.  Last Pain:  Vitals:   02/25/23 0747  PainSc: 2          Complications: No notable events documented.

## 2023-02-25 NOTE — H&P (Signed)
BP 139/63   Pulse 63   Temp 98.7 F (37.1 C)   Resp 18   Ht 5\' 8"  (1.727 m)   Wt 81.6 kg   SpO2 95%   BMI 27.37 kg/m  Brett [name] returned today with an MRI of the lumbar spine.  He has classic adjacent segment disease, severe stenosis present at the L3-4 level.  Where he was operated on is wide open; where as at L3 he has a fairly large herniated disc eccentric to the left and severe stenosis, ligamentous hypertrophy, and facet hypertrophy.  The rest of the spine looks good.  Conus is otherwise normal.  The cauda equina is normal.  Paraspinous soft tissues are normal.  No tumors, no infections.   He would like to proceed with operative decompression and, again, another level arthrodesed with cages and screws.  Allergies  Allergen Reactions   Other     RSD   Gabapentin (Once-Daily) Nausea Only   Statins     myalgias   Tramadol Nausea Only   Past Medical History:  Diagnosis Date   Allergy    seasonal   Anemia 06/2019   iron deficiency   Arthritis    Cataract    Diverticulosis    GERD (gastroesophageal reflux disease)    Heart murmur    Hiatal hernia 2014   [facility])   History of hiatal hernia    Hyperlipidemia    Hypertension    IBS (irritable colon syndrome)    Irregular heart beat    Pneumonia    hx. of   PONV (postoperative nausea and vomiting)    with gallbladder surgery   SCC (squamous cell carcinoma) 01/02/2019   R [id number]   Sleep apnea    cpap   Vertigo    Past Surgical History:  Procedure Laterality Date   CARPAL TUNNEL RELEASE Bilateral 05/2022   CHOLECYSTECTOMY  2004   Post-cholecystectomy syndrome    COLONOSCOPY     ESOPHAGOGASTRODUODENOSCOPY     EYE SURGERY     cataract   FRACTURE SURGERY  2006   fx. wrist   JOINT REPLACEMENT     KNEE SURGERY Left 2020   LUMBAR DISC SURGERY     L4-L5   rhino plasty     deviated septum   SHOULDER SURGERY  2011   rotator cuff   TOTAL HIP ARTHROPLASTY Right 12/13/2016   Procedure: RIGHT TOTAL  HIP ARTHROPLASTY ANTERIOR APPROACH;  Surgeon: Samson Frederic, MD;  Location: [facility] OR;  Service: Orthopedics;  Laterality: Right;   TOTAL SHOULDER ARTHROPLASTY Right 04/15/2017   Procedure: RIGHT TOTAL SHOULDER ARTHROPLASTY;  Surgeon: Beverely Low, MD;  Location: [facility] OR;  Service: Orthopedics;  Laterality: Right;   TYMPANOSTOMY TUBE PLACEMENT Left 2020   UPPER GASTROINTESTINAL ENDOSCOPY     VASECTOMY  1980   Family History  Problem Relation Age of Onset   Cancer Sister        uterine    Cancer Maternal Grandmother    Colon cancer Neg Hx    Colon polyps Neg Hx    Esophageal cancer Neg Hx    Rectal cancer Neg Hx    Stomach cancer Neg Hx    Social History   Socioeconomic History   Marital status: Married    Spouse name: Not on file   Number of children: 2   Years of education: Not on file   Highest education level: Bachelor's degree (e.g., BA, AB, BS)  Occupational History   Not on  file  Tobacco Use   Smoking status: Former    Current packs/day: 0.00    Average packs/day: 2.0 packs/day for 51.0 years (102.0 ttl pk-yrs)    Types: Cigarettes, Cigars    Start date: 69    Quit date: 2012    Years since quitting: 12.7   Smokeless tobacco: Never  Vaping Use   Vaping status: Never Used  Substance and Sexual Activity   Alcohol use: No   Drug use: No   Sexual activity: Yes  Other Topics Concern   Not on file  Social History Narrative   Not on file   Social Determinants of Health   Financial Resource Strain: Low Risk  (12/04/2022)   Overall Financial Resource Strain (CARDIA)    Difficulty of Paying Living Expenses: Not hard at all  Food Insecurity: No Food Insecurity (12/04/2022)   Hunger Vital Sign    Worried About Running Out of Food in the Last Year: Never true    Ran Out of Food in the Last Year: Never true  Transportation Needs: No Transportation Needs (12/04/2022)   PRAPARE - Administrator, Civil Service (Medical): No    Lack of Transportation  (Non-Medical): No  Physical Activity: Sufficiently Active (12/04/2022)   Exercise Vital Sign    Days of Exercise per Week: 6 days    Minutes of Exercise per Session: 150+ min  Stress: No Stress Concern Present (12/04/2022)   [employer] of Occupational Health - Occupational Stress Questionnaire    Feeling of Stress : Not at all  Social Connections: Moderately Isolated (12/04/2022)   Social Connection and Isolation Panel [NHANES]    Frequency of Communication with Friends and Family: Once a week    Frequency of Social Gatherings with Friends and Family: Twice a week    Attends Religious Services: Never    Database administrator or Organizations: No    Attends Banker Meetings: Never    Marital Status: Married  Catering manager Violence: Not At Risk (10/07/2022)   Humiliation, Afraid, Rape, and Kick questionnaire    Fear of Current or Ex-Partner: No    Emotionally Abused: No    Physically Abused: No    Sexually Abused: No   Physical Exam Constitutional:      Appearance: Normal appearance. He is obese.  HENT:     Head: Normocephalic and atraumatic.     Right Ear: External ear normal.     Left Ear: External ear normal.     Nose: Nose normal.     Mouth/Throat:     Mouth: Mucous membranes are moist.     Pharynx: Oropharynx is clear.  Eyes:     Extraocular Movements: Extraocular movements intact.     Pupils: Pupils are equal, round, and reactive to light.  Cardiovascular:     Rate and Rhythm: Normal rate and regular rhythm.     Pulses: Normal pulses.     Heart sounds: Normal heart sounds.  Pulmonary:     Effort: Pulmonary effort is normal.  Abdominal:     General: Abdomen is flat.  Musculoskeletal:        General: Normal range of motion.     Cervical back: Normal range of motion.  Skin:    General: Skin is warm and dry.  Neurological:     General: No focal deficit present.     Mental Status: He is alert and oriented to person, place, and time.     Cranial  Nerves:  No cranial nerve deficit.     Sensory: No sensory deficit.     Motor: No weakness.     Coordination: Coordination normal.     Gait: Gait abnormal.     Deep Tendon Reflexes: Reflexes normal.  Psychiatric:        Mood and Affect: Mood normal.        Behavior: Behavior normal.        Thought Content: Thought content normal.        Judgment: Judgment normal.

## 2023-02-25 NOTE — Anesthesia Procedure Notes (Signed)
Procedure Name: Intubation Date/Time: 02/25/2023 7:52 PM  Performed by: Hessie Diener, CRNAPre-anesthesia Checklist: Patient identified, Emergency Drugs available, Suction available and Patient being monitored Patient Re-evaluated:Patient Re-evaluated prior to induction Oxygen Delivery Method: Circle System Utilized Preoxygenation: Pre-oxygenation with 100% oxygen Induction Type: IV induction Ventilation: Mask ventilation without difficulty Laryngoscope Size: Mac and 4 Grade View: Grade I Tube type: Oral Tube size: 7.5 mm Number of attempts: 1 Airway Equipment and Method: Stylet and Oral airway Placement Confirmation: ETT inserted through vocal cords under direct vision, positive ETCO2 and breath sounds checked- equal and bilateral Tube secured with: Tape Dental Injury: Teeth and Oropharynx as per pre-operative assessment

## 2023-02-26 DIAGNOSIS — M48061 Spinal stenosis, lumbar region without neurogenic claudication: Secondary | ICD-10-CM | POA: Diagnosis not present

## 2023-02-26 MED ORDER — ONDANSETRON HCL 4 MG PO TABS: 4.0000 mg | ORAL_TABLET | Freq: Three times a day (TID) | ORAL | 0 refills | Status: DC | PRN

## 2023-02-26 MED ORDER — BACLOFEN 10 MG PO TABS: 10.0000 mg | ORAL_TABLET | Freq: Three times a day (TID) | ORAL | 0 refills | Status: DC | PRN

## 2023-02-26 MED ORDER — OXYCODONE HCL 5 MG PO TABS: 5.0000 mg | ORAL_TABLET | ORAL | 0 refills | Status: DC | PRN

## 2023-02-26 NOTE — Progress Notes (Signed)
PT Cancellation Note  Patient Details Name: Brett [name] MRN: [id number] DOB: Feb 08, 1946   Cancelled Treatment:    Reason Eval/Treat Not Completed: PT screened, no needs identified, will sign off. Pt doing very well with mobility per OT and no PT needs.   Angelina Ok [facility] 02/26/2023, 9:11 AM Skip Mayer PT Acute [employer] [phone]

## 2023-02-26 NOTE — Discharge Summary (Signed)
Patient ID: Brett [name] MRN: [id number] DOB/AGE: [birth year] 77 y.o.  Admit date: 02/25/2023 Discharge date: 02/26/2023  Admission Diagnoses:adjacent segment disease, severe stenosis present at the L3-4 level   Discharge Diagnoses: adjacent segment disease, severe stenosis present at the L3-4 level    Discharged Condition: Stable  Hospital Course:  ROMMEL PANDA is a 77 y.o. male who was admitted following an uncomplicated revision lumbar fusion, extension of fusion to L3. They were recovered in PACU and transferred to [facility]. Hospital course was uncomplicated. Patient's incision was persistently draining serous/bloody discharge upon exam. Staples placed at inferior and superior portions of incision and drainage slowed. Patient comfortable for dc and understands parameters for contacting provider per incision. Pt to f/u in office for routine post op visit. Pt is in agreement w/ plan.    Discharge Exam: Blood pressure 132/72, pulse 95, temperature 97.8 F (36.6 C), temperature source Oral, resp. rate 20, height 5\' 8"  (1.727 m), weight 81.6 kg, SpO2 94%. A&O Speech fluent, appropriate Strength intact grossly SILTx4.  Dressing changed at bedside, c/d/I.   Disposition: Discharge disposition: 01-Home or Self Care        Allergies as of 02/26/2023       Reactions   Other    RSD   Gabapentin (once-daily) Nausea Only   Statins    myalgias   Tramadol Nausea Only        Medication List     TAKE these medications    acetaminophen 500 MG tablet Commonly known as: TYLENOL Take 1,000 mg by mouth every 6 (six) hours as needed for moderate pain.   baclofen 10 MG tablet Commonly known as: LIORESAL Take 1 tablet (10 mg total) by mouth 3 (three) times daily as needed for muscle spasms. What changed:  how much to take when to take this reasons to take this   chlorthalidone 25 MG tablet Commonly known as: HYGROTON Take 1 tablet (25 mg total) by mouth every morning.    colestipol 1 g tablet Commonly known as: COLESTID Take 1 tablet (1 g total) by mouth 2 (two) times daily.   diltiazem 180 MG 24 hr capsule Commonly known as: CARDIZEM CD Take 1 capsule (180 mg total) by mouth every morning.   ezetimibe 10 MG tablet Commonly known as: ZETIA Take 10 mg by mouth at bedtime.   famotidine 20 MG tablet Commonly known as: PEPCID Take 1 tablet (20 mg total) by mouth daily. What changed:  when to take this reasons to take this   fenofibrate 48 MG tablet Commonly known as: TRICOR Take 1 tablet (48 mg total) by mouth daily.   losartan 100 MG tablet Commonly known as: COZAAR Take 1 tablet (100 mg total) by mouth every evening.   ondansetron 4 MG tablet Commonly known as: ZOFRAN Take 1 tablet (4 mg total) by mouth every 8 (eight) hours as needed for nausea or vomiting.   oxyCODONE 5 MG immediate release tablet Commonly known as: Oxy IR/ROXICODONE Take 1 tablet (5 mg total) by mouth every 4 (four) hours as needed for moderate pain ((score 4 to 6)).   pantoprazole 40 MG tablet Commonly known as: PROTONIX Take 1 tablet (40 mg total) by mouth 2 (two) times daily.         Signed: Clovis Riley 02/26/2023, 9:33 AM

## 2023-02-26 NOTE — Progress Notes (Signed)
Patient is discharged from room 3C04 at this time. Alert and in stable condition. IV site d/c'd and instructions read to patient and spouse with understanding verbalized and all questions answered. Dressing changed to incision and some supplies given to patient. Left unit via wheelchair with all belongings at side.

## 2023-02-26 NOTE — Discharge Instructions (Signed)
Wound Care Leave incision open to air. You may shower. Do not scrub directly on incision.  Do not put any creams, lotions, or ointments on incision. Activity Walk each and every day, increasing distance each day. No lifting greater than 5 lbs.  Avoid bending, arching, and twisting. No driving for 2 weeks; may ride as a passenger locally. If provided with back brace, wear when out of bed.  It is not necessary to wear in bed. Diet Resume your normal diet.  Return to Work Will be discussed at you follow up appointment. Call Your Doctor If Any of These Occur Redness, drainage, or swelling at the wound.  Temperature greater than 101 degrees. Severe pain not relieved by pain medication. Incision starts to come apart. Follow Up Appt Call today for appointment in 4 weeks ([phone]) or for problems.  If you have any hardware placed in your spine, you will need an x-ray before your appointment.

## 2023-02-26 NOTE — Care Management (Signed)
Patient with order to DC to home today. Unit staff to provide DME needed for home.   No HH needs identified Patient will have family/ friends provide transportation home. No other TOC needs identified for DC 

## 2023-02-26 NOTE — Evaluation (Signed)
Occupational Therapy Evaluation Patient Details Name: Brett [name] MRN: [id number] DOB: 1946-02-08 Today's Date: 02/26/2023   History of Present Illness Pt is a 77 y/o M presenting with severe stenosis at L3-4 level, L3 herniated disc. S/p L3-4 on 10/11. PMH includes arthritis, GERD, cataracts, diverticulosis, heart murmur, IBS, HTN, HLD   Clinical Impression   Pt reports ind at baseline with ADLs/functional mobility, lives with spouse who can assist at d/c. Pt currently needing set up - supervision for ADLs, mod I for bed mobility and mod I for transfers without AD. Pt provided with handout and educated on back precautions, compensatory strategies for ADLs/mobility and pt able to verbalize/demo during session. Pt presenting with impairments listed below, however has no acute OT needs at this time, will s/o. Please reconsult if there is a change in pt status. Anticipate no OT follow up needs at d/c.        If plan is discharge home, recommend the following: A little help with bathing/dressing/bathroom;Assistance with cooking/housework;Direct supervision/assist for medications management;Direct supervision/assist for financial management;Assist for transportation    Functional Status Assessment  Patient has had a recent decline in their functional status and demonstrates the ability to make significant improvements in function in a reasonable and predictable amount of time.  Equipment Recommendations  None recommended by OT (pt has all needed DME)    Recommendations for Other Services PT consult     Precautions / Restrictions Precautions Precautions: Back Precaution Booklet Issued: Yes (comment) Precaution Comments: educated on back prec Required Braces or Orthoses: Other Brace Other Brace: no brace needed per MD Restrictions Weight Bearing Restrictions: No      Mobility Bed Mobility Overal bed mobility: Modified Independent             General bed mobility comments: min  cues for log roll technique    Transfers Overall transfer level: Modified independent Equipment used: None                      Balance Overall balance assessment: Mild deficits observed, not formally tested                                         ADL either performed or assessed with clinical judgement   ADL Overall ADL's : Needs assistance/impaired Eating/Feeding: Set up   Grooming: Set up   Upper Body Bathing: Supervision/ safety   Lower Body Bathing: Supervison/ safety   Upper Body Dressing : Supervision/safety   Lower Body Dressing: Supervision/safety   Toilet Transfer: Supervision/safety   Toileting- Clothing Manipulation and Hygiene: Supervision/safety       Functional mobility during ADLs: Supervision/safety       Vision   Vision Assessment?: No apparent visual deficits     Perception Perception: Not tested       Praxis Praxis: Not tested       Pertinent Vitals/Pain Pain Assessment Pain Assessment: No/denies pain     Extremity/Trunk Assessment Upper Extremity Assessment Upper Extremity Assessment: Overall WFL for tasks assessed   Lower Extremity Assessment Lower Extremity Assessment: Overall WFL for tasks assessed   Cervical / Trunk Assessment Cervical / Trunk Assessment: Back Surgery   Communication Communication Communication: No apparent difficulties   Cognition Arousal: Alert Behavior During Therapy: WFL for tasks assessed/performed Overall Cognitive Status: Within Functional Limits for tasks assessed  General Comments  VSS    Exercises     Shoulder Instructions      Home Living Family/patient expects to be discharged to:: Private residence Living Arrangements: Spouse/significant other Available Help at Discharge: Family;Available 24 hours/day Type of Home: House Home Access: Stairs to enter [employer] of Steps: 3   Home Layout:  One level     Bathroom Shower/Tub: Producer, television/film/video: Handicapped height Bathroom Accessibility: Yes   Home Equipment: Shower seat   Additional Comments: reports home is handicap accessible      Prior Functioning/Environment Prior Level of Function : Independent/Modified Independent                        OT Problem List:        OT Treatment/Interventions:      OT Goals(Current goals can be found in the care plan section) Acute Rehab OT Goals Patient Stated Goal: none stated OT Goal Formulation: With patient Time For Goal Achievement: 03/12/23 Potential to Achieve Goals: Good  OT Frequency:      Co-evaluation              AM-PAC OT "6 Clicks" Daily Activity     Outcome Measure Help from another person eating meals?: None Help from another person taking care of personal grooming?: None Help from another person toileting, which includes using toliet, bedpan, or urinal?: A Little Help from another person bathing (including washing, rinsing, drying)?: A Little Help from another person to put on and taking off regular upper body clothing?: A Little Help from another person to put on and taking off regular lower body clothing?: A Little 6 Click Score: 20   End of Session Nurse Communication: Mobility status;Patient requests pain meds;Other (comment) (per PA ok to mobilize after bandage on back changed)  Activity Tolerance: Patient tolerated treatment well Patient left: in bed;with call bell/phone within reach  OT Visit Diagnosis: Unsteadiness on feet (R26.81);Other abnormalities of gait and mobility (R26.89);Muscle weakness (generalized) (M62.81)                Time: 0102-7253 OT Time Calculation (min): 25 min Charges:  OT General Charges $OT Visit: 1 Visit OT Evaluation $OT Eval Low Complexity: 1 Low OT Treatments $Self Care/Home Management : 8-22 mins  Brett [name], OTD, OTR/L SecureChat Preferred Acute Rehab [phone]   Brett [name] 02/26/2023, 9:13 AM

## 2023-02-28 ENCOUNTER — Other Ambulatory Visit (HOSPITAL_COMMUNITY): Payer: Self-pay

## 2023-02-28 ENCOUNTER — Other Ambulatory Visit: Payer: Self-pay | Admitting: Family Medicine

## 2023-02-28 MED ORDER — EZETIMIBE 10 MG PO TABS: 10.0000 mg | ORAL_TABLET | Freq: Every day | ORAL | 1 refills | Status: DC

## 2023-03-03 ENCOUNTER — Ambulatory Visit: Payer: Medicare HMO | Admitting: Cardiology

## 2023-03-03 NOTE — Anesthesia Postprocedure Evaluation (Signed)
Anesthesia Post Note  [name]  Procedure(s) Performed: Posterior Lumbar Interbody Fusion - Posterior Lateral and Interbody fusion Lumbar three-four     Patient location during evaluation: PACU Anesthesia Type: General Level of consciousness: awake and alert Pain management: pain level controlled Vital Signs Assessment: post-procedure vital signs reviewed and stable Respiratory status: spontaneous breathing, nonlabored ventilation, respiratory function stable and patient connected to nasal cannula oxygen Cardiovascular status: blood pressure returned to baseline and stable Postop Assessment: no apparent nausea or vomiting Anesthetic complications: no   No notable events documented.               [name]

## 2023-03-07 ENCOUNTER — Encounter: Payer: Self-pay | Admitting: Family Medicine

## 2023-03-07 ENCOUNTER — Ambulatory Visit (INDEPENDENT_AMBULATORY_CARE_PROVIDER_SITE_OTHER): Payer: Medicare HMO | Admitting: Family Medicine

## 2023-03-07 VITALS — BP 115/64 | HR 60 | Temp 97.8°F | Ht 68.0 in | Wt 181.0 lb

## 2023-03-07 DIAGNOSIS — E782 Mixed hyperlipidemia: Secondary | ICD-10-CM

## 2023-03-07 DIAGNOSIS — I700 Atherosclerosis of aorta: Secondary | ICD-10-CM

## 2023-03-07 DIAGNOSIS — I10 Essential (primary) hypertension: Secondary | ICD-10-CM

## 2023-03-07 DIAGNOSIS — Z23 Encounter for immunization: Secondary | ICD-10-CM | POA: Diagnosis not present

## 2023-03-07 MED ORDER — TAMSULOSIN HCL 0.4 MG PO CAPS: 0.4000 mg | ORAL_CAPSULE | Freq: Every day | ORAL | 3 refills | Status: DC

## 2023-03-07 MED ORDER — EZETIMIBE 10 MG PO TABS: 10.0000 mg | ORAL_TABLET | Freq: Every day | ORAL | 1 refills | Status: DC

## 2023-03-07 MED ORDER — FAMOTIDINE 20 MG PO TABS: 20.0000 mg | ORAL_TABLET | Freq: Two times a day (BID) | ORAL | 3 refills | Status: DC

## 2023-03-07 NOTE — Assessment & Plan Note (Signed)
Continue Fenofibrate, Colestid, Zetia. Fasting labs today.  I recommend consuming a heart healthy diet such as Mediterranean diet or DASH diet with whole grains, fruits, vegetable, fish, lean meats, nuts, and olive oil. Limit sweets and processed foods. I also encourage moderate intensity exercise 150 minutes weekly. This is 3-5 times weekly for 30-50 minutes each session. Goal should be pace of 3 miles/hours, or walking 1.5 miles in 30 minutes. The 10-year ASCVD risk score (Arnett DK, et al., 2019) is: 27.1%

## 2023-03-07 NOTE — Assessment & Plan Note (Signed)
Chronic. Well controlled. Continue Diltiazem 180mg  daily, Losartan 100mg  daily. Recommend heart healthy diet such as Mediterranean diet with whole grains, fruits, vegetable, fish, lean meats, nuts, and olive oil. Limit salt. Encouraged moderate walking, 3-5 times/week for 30-50 minutes each session. Aim for at least 150 minutes.week. Goal should be pace of 3 miles/hours, or walking 1.5 miles in 30 minutes. Avoid tobacco products. Avoid excess alcohol. Take medications as prescribed and bring medications and blood pressure log with cuff to each office visit. Seek medical care for chest pain, palpitations, shortness of breath with exertion, dizziness/lightheadedness, vision changes, recurrent headaches, or swelling of extremities.

## 2023-03-07 NOTE — Progress Notes (Signed)
Subjective:  HPI: Brett [name] is a 77 y.o. male presenting on 03/07/2023 for No chief complaint on file.   HPI Patient is in today for chronic condition follow up for HTN and HLD.   HYPERTENSION / HYPERLIPIDEMIA Satisfied with current treatment? yes Duration of hypertension: chronic BP monitoring frequency: daily BP range: <130/80 BP medication side effects: no Past BP meds: chlorthalidone, diltiazem, and losartan (cozaar) Duration of hyperlipidemia: chronic Cholesterol medication side effects: no Cholesterol supplements: none Past cholesterol medications: fenofibrate (tricor) and ezetimide (zetia) Medication compliance: excellent compliance Aspirin: no Recent stressors: no Recurrent headaches: no Visual changes: no Palpitations: no Dyspnea: no Chest pain: no Lower extremity edema: no Dizzy/lightheaded: no   Review of Systems  All other systems reviewed and are negative.   Relevant past medical history reviewed and updated as indicated.   Past Medical History:  Diagnosis Date   Allergy    seasonal   Anemia 06/2019   iron deficiency   Arthritis    Cataract    Diverticulosis    GERD (gastroesophageal reflux disease)    Heart murmur    Hiatal hernia 2014   Dr. [facility])   History of hiatal hernia    Hyperlipidemia    Hypertension    IBS (irritable colon syndrome)    Irregular heart beat    Pneumonia    hx. of   PONV (postoperative nausea and vomiting)    with gallbladder surgery   SCC (squamous cell carcinoma) 01/02/2019   R [id number]   Sleep apnea    cpap   Vertigo      Past Surgical History:  Procedure Laterality Date   CARPAL TUNNEL RELEASE Bilateral 05/2022   CHOLECYSTECTOMY  2004   Post-cholecystectomy syndrome    COLONOSCOPY     ESOPHAGOGASTRODUODENOSCOPY     EYE SURGERY     cataract   FRACTURE SURGERY  2006   fx. wrist   JOINT REPLACEMENT     KNEE SURGERY Left 2020   LUMBAR DISC SURGERY     L4-L5   rhino plasty      deviated septum   SHOULDER SURGERY  2011   rotator cuff   TOTAL HIP ARTHROPLASTY Right 12/13/2016   Procedure: RIGHT TOTAL HIP ARTHROPLASTY ANTERIOR APPROACH;  Surgeon: Samson Frederic, MD;  Location: [facility] OR;  Service: Orthopedics;  Laterality: Right;   TOTAL SHOULDER ARTHROPLASTY Right 04/15/2017   Procedure: RIGHT TOTAL SHOULDER ARTHROPLASTY;  Surgeon: Beverely Low, MD;  Location: [facility] OR;  Service: Orthopedics;  Laterality: Right;   TYMPANOSTOMY TUBE PLACEMENT Left 2020   UPPER GASTROINTESTINAL ENDOSCOPY     VASECTOMY  1980    Allergies and medications reviewed and updated.   Current Outpatient Medications:    acetaminophen (TYLENOL) 500 MG tablet, Take 1,000 mg by mouth every 6 (six) hours as needed for moderate pain., Disp: , Rfl:    chlorthalidone (HYGROTON) 25 MG tablet, Take 1 tablet (25 mg total) by mouth every morning., Disp: 90 tablet, Rfl: 0   colestipol (COLESTID) 1 g tablet, Take 1 tablet (1 g total) by mouth 2 (two) times daily., Disp: 180 tablet, Rfl: 3   diltiazem (CARDIZEM CD) 180 MG 24 hr capsule, Take 1 capsule (180 mg total) by mouth every morning., Disp: 90 capsule, Rfl: 3   fenofibrate (TRICOR) 48 MG tablet, Take 1 tablet (48 mg total) by mouth daily., Disp: 90 tablet, Rfl: 3   losartan (COZAAR) 100 MG tablet, Take 1 tablet (100 mg total) by mouth every evening.,  Disp: 90 tablet, Rfl: 3   pantoprazole (PROTONIX) 40 MG tablet, Take 1 tablet (40 mg total) by mouth 2 (two) times daily., Disp: 180 tablet, Rfl: 3   tamsulosin (FLOMAX) 0.4 MG CAPS capsule, Take 1 capsule (0.4 mg total) by mouth daily., Disp: 30 capsule, Rfl: 3   ezetimibe (ZETIA) 10 MG tablet, Take 1 tablet (10 mg total) by mouth at bedtime., Disp: 90 tablet, Rfl: 1   famotidine (PEPCID) 20 MG tablet, Take 1 tablet (20 mg total) by mouth 2 (two) times daily. Only as neededTake 20 mg by mouth 2 (two) times daily. Only as needed, Disp: 90 tablet, Rfl: 3  Allergies  Allergen Reactions   Statins     myalgias    Tramadol Nausea Only    Objective:   BP 115/64   Pulse 60   Temp 97.8 F (36.6 C) (Oral)   Ht 5\' 8"  (1.727 m)   Wt 181 lb (82.1 kg)   SpO2 94%   BMI 27.52 kg/m      03/07/2023    8:21 AM 02/26/2023    7:15 AM 02/26/2023    3:36 AM  Vitals with BMI  Height 5\' 8"     Weight 181 lbs    BMI 27.53    Systolic 115 132 329  Diastolic 64 72 54  Pulse 60 95 86     Physical Exam Vitals and nursing note reviewed.  Constitutional:      Appearance: Normal appearance. He is normal weight.  HENT:     Head: Normocephalic and atraumatic.  Neck:     Vascular: No carotid bruit.  Cardiovascular:     Rate and Rhythm: Normal rate and regular rhythm.     Pulses: Normal pulses.     Heart sounds: Normal heart sounds.  Pulmonary:     Effort: Pulmonary effort is normal.     Breath sounds: Normal breath sounds.  Skin:    General: Skin is warm and dry.     Capillary Refill: Capillary refill takes less than 2 seconds.  Neurological:     General: No focal deficit present.     Mental Status: He is alert and oriented to person, place, and time. Mental status is at baseline.  Psychiatric:        Mood and Affect: Mood normal.        Behavior: Behavior normal.        Thought Content: Thought content normal.        Judgment: Judgment normal.     Assessment & Plan:  Essential (primary) hypertension Assessment & Plan: Chronic. Well controlled. Continue Diltiazem 180mg  daily, Losartan 100mg  daily. Recommend heart healthy diet such as Mediterranean diet with whole grains, fruits, vegetable, fish, lean meats, nuts, and olive oil. Limit salt. Encouraged moderate walking, 3-5 times/week for 30-50 minutes each session. Aim for at least 150 minutes.week. Goal should be pace of 3 miles/hours, or walking 1.5 miles in 30 minutes. Avoid tobacco products. Avoid excess alcohol. Take medications as prescribed and bring medications and blood pressure log with cuff to each office visit. Seek medical care for  chest pain, palpitations, shortness of breath with exertion, dizziness/lightheadedness, vision changes, recurrent headaches, or swelling of extremities.   Orders: -     CBC with Differential/Platelet -     COMPLETE METABOLIC PANEL WITH GFR -     Lipid panel  Mixed hyperlipidemia Assessment & Plan: Continue Fenofibrate, Colestid, Zetia. Fasting labs today.  I recommend consuming a heart healthy diet such  as Mediterranean diet or DASH diet with whole grains, fruits, vegetable, fish, lean meats, nuts, and olive oil. Limit sweets and processed foods. I also encourage moderate intensity exercise 150 minutes weekly. This is 3-5 times weekly for 30-50 minutes each session. Goal should be pace of 3 miles/hours, or walking 1.5 miles in 30 minutes. The 10-year ASCVD risk score (Arnett DK, et al., 2019) is: 27.1%   Orders: -     CBC with Differential/Platelet -     COMPLETE METABOLIC PANEL WITH GFR -     Lipid panel  Flu vaccine need -     Flu Vaccine Trivalent High Dose (Fluad)  Aortic atherosclerosis (HCC) Assessment & Plan: Continue Zetia, Colestid, and Fenofibrate. BP well controlled. Fasting lipids today. Followed by cardiology.   Other orders -     Ezetimibe; Take 1 tablet (10 mg total) by mouth at bedtime.  Dispense: 90 tablet; Refill: 1 -     Famotidine; Take 1 tablet (20 mg total) by mouth 2 (two) times daily. Only as neededTake 20 mg by mouth 2 (two) times daily. Only as needed  Dispense: 90 tablet; Refill: 3 -     Tamsulosin HCl; Take 1 capsule (0.4 mg total) by mouth daily.  Dispense: 30 capsule; Refill: 3     Follow up plan: Return in about 6 months (around 09/05/2023) for annual physical with labs 1 week prior.  Park Meo, FNP

## 2023-03-07 NOTE — Assessment & Plan Note (Signed)
Continue Zetia, Colestid, and Fenofibrate. BP well controlled. Fasting lipids today. Followed by cardiology.

## 2023-03-08 LAB — LIPID PANEL
Cholesterol: 188 mg/dL (ref <200)
HDL: 43 mg/dL (ref >=40)
LDL Cholesterol (Calc): 119 mg/dL — ABNORMAL HIGH
Non-HDL Cholesterol (Calc): 145 mg/dL — ABNORMAL HIGH (ref <130)
Total CHOL/HDL Ratio: 4.4 (calc) (ref <5.0)
Triglycerides: 147 mg/dL (ref <150)

## 2023-03-08 LAB — CBC WITH DIFFERENTIAL/PLATELET
Absolute Lymphocytes: 2148 {cells}/uL (ref 850–3900)
Absolute Monocytes: 577 {cells}/uL (ref 200–950)
Basophils Absolute: 84 {cells}/uL (ref 0–200)
Basophils Relative: 0.9 %
Eosinophils Absolute: 632 {cells}/uL — ABNORMAL HIGH (ref 15–500)
Eosinophils Relative: 6.8 %
HCT: 38.2 % — ABNORMAL LOW (ref 38.5–50.0)
Hemoglobin: 12.3 g/dL — ABNORMAL LOW (ref 13.2–17.1)
MCH: 27.4 pg (ref 27.0–33.0)
MCHC: 32.2 g/dL (ref 32.0–36.0)
MCV: 85.1 fL (ref 80.0–100.0)
MPV: 8.3 fL (ref 7.5–12.5)
Monocytes Relative: 6.2 %
Neutro Abs: 5859 {cells}/uL (ref 1500–7800)
Neutrophils Relative %: 63 %
Platelets: 406 10*3/uL — ABNORMAL HIGH (ref 140–400)
RBC: 4.49 10*6/uL (ref 4.20–5.80)
RDW: 12.4 % (ref 11.0–15.0)
Total Lymphocyte: 23.1 %
WBC: 9.3 10*3/uL (ref 3.8–10.8)

## 2023-03-08 LAB — COMPLETE METABOLIC PANEL WITH GFR
AG Ratio: 1.5 (calc) (ref 1.0–2.5)
ALT: 15 U/L (ref 9–46)
AST: 9 U/L — ABNORMAL LOW (ref 10–35)
Albumin: 3.9 g/dL (ref 3.6–5.1)
Alkaline phosphatase (APISO): 64 U/L (ref 35–144)
BUN: 14 mg/dL (ref 7–25)
CO2: 33 mmol/L — ABNORMAL HIGH (ref 20–32)
Calcium: 9.3 mg/dL (ref 8.6–10.3)
Chloride: 94 mmol/L — ABNORMAL LOW (ref 98–110)
Creat: 0.94 mg/dL (ref 0.70–1.28)
Globulin: 2.6 g/dL (ref 1.9–3.7)
Glucose, Bld: 104 mg/dL — ABNORMAL HIGH (ref 65–99)
Potassium: 3.8 mmol/L (ref 3.5–5.3)
Sodium: 136 mmol/L (ref 135–146)
Total Bilirubin: 0.4 mg/dL (ref 0.2–1.2)
Total Protein: 6.5 g/dL (ref 6.1–8.1)
eGFR: 84 mL/min/{1.73_m2} (ref >=60)

## 2023-03-09 ENCOUNTER — Other Ambulatory Visit: Payer: Self-pay | Admitting: Family Medicine

## 2023-03-09 DIAGNOSIS — E782 Mixed hyperlipidemia: Secondary | ICD-10-CM

## 2023-03-10 NOTE — Op Note (Signed)
02/25/2023  9:18 PM  PATIENT:  Brett [name]  77 y.o. male  PRE-OPERATIVE DIAGNOSIS:  Stenosis  POST-OPERATIVE DIAGNOSIS:  Stenosis  PROCEDURE:  Procedure(s): Posterior Lumbar Interbody Fusion - Posterior Lateral and Interbody fusion Lumbar three-four  Laminectomy L3 beyond the needed exposure for a PLIF at L3/4 Segmental pedicle screw fixation L3/4 SURGEON:  Surgeon(s): Coletta Memos, MD Bedelia Person, MD  ASSISTANTS:Thomas, Christiane Ha  ANESTHESIA:   general  EBL:  No intake/output data recorded.  BLOOD ADMINISTERED:none  CELL SAVER GIVEN:not used  COUNT:per nursing  DRAINS: Urinary Catheter (Foley)   SPECIMEN:  No Specimen  DICTATION: [name] is a 77 y.o. male whom was taken to the operating room intubated, and placed under a general anesthetic without difficulty. A foley catheter was placed under sterile conditions. He was positioned prone on a Jackson table with all pressure points properly padded.  HIs lumbar region was prepped and draped in a sterile manner. I infiltrated 10cc's 1/2%lidocaine/1:2000,000 strength epinephrine into the planned incision. I opened the skin with a 10 blade and took the incision down to the thoracolumbar fascia. I exposed the lamina of L2,3,and 4 in a subperiosteal fashion bilaterally. I confirmed my location with an intraoperative xray.  I placed self retaining retractors and started the decompression.  I decompressed the spinal canal via a laminectomy of L3, inferior facetectomies of L3 and partial superior facetectomies of L4 bilaterally. The lateral recesses were fully decompressed and unroofed. The bony removal was well beyond the needed exposure to perform  PLIF at L3/4. I used the drill and Kerrison punches to remove the bone and ligamentum flavum.Dr. Maisie Fus assisted with the decompression.  PLIF's were performed at L3/4 in the same fashion. I opened the disc space with a 15 blade then used a variety of instruments to remove the  disc and prepare the space for the arthrodesis. I used curettes, rongeurs, punches, shavers for the disc space, and rasps in the discetomy. I measured the disc space and placed 2 cages(Half Dome X expandable titanium) into the disc space(s). The disc space and cages were packed with autograft morsels.    I placed pedicle screws at L3 and L4, using fluoroscopic guidance. I drilled a pilot hole, then cannulated the pedicle with a drill at each site. I then tapped each pedicle, assessing each site for pedicle violations. No cutouts were appreciated. Screws (nuvasive relign) were then placed at each site without difficulty. I attached rods and locking caps with the appropriate tools. The locking caps were secured with torque limited screwdrivers. Final films were performed and the final construct appeared to be in good position.  I closed the wound in a layered fashion. I approximated the thoracolumbar fascia, subcutaneous, and subcuticular planes with vicryl sutures. I used dermabond and an occlusive bandage for a sterile dressing.     PLAN OF CARE: Admit to inpatient   PATIENT DISPOSITION:  PACU - hemodynamically stable.   Delay start of Pharmacological VTE agent (>24hrs) due to surgical blood loss or risk of bleeding:  yes

## 2023-03-15 ENCOUNTER — Telehealth: Payer: Self-pay

## 2023-03-15 NOTE — Progress Notes (Signed)
Care Guide Note  03/15/2023 Name: [name] MRN: [id number] DOB: 10/27/1945  Referred by: Park Meo, FNP Reason for referral : Care Coordination (Outreach to schedule with Pharm d )   Brett [name] is a 77 y.o. year old male who is a primary care patient of Park Meo, FNP. KHALANI GAGNIER was referred to the pharmacist for assistance related to HLD.    Successful contact was made with the patient to discuss pharmacy services including being ready for the pharmacist to call at least 5 minutes before the scheduled appointment time, to have medication bottles and any blood sugar or blood pressure readings ready for review. The patient agreed to meet with the pharmacist via with the pharmacist via telephone visit on (date/time).  04/06/2023  Penne Lash, RMA Care Guide [facility]  [place], [place] [address] Direct Dial: [phone] [email or web address]

## 2023-03-19 ENCOUNTER — Other Ambulatory Visit: Payer: Self-pay | Admitting: Cardiology

## 2023-03-19 ENCOUNTER — Encounter: Payer: Self-pay | Admitting: Family Medicine

## 2023-03-19 DIAGNOSIS — I10 Essential (primary) hypertension: Secondary | ICD-10-CM

## 2023-03-21 ENCOUNTER — Other Ambulatory Visit: Payer: Self-pay

## 2023-03-21 DIAGNOSIS — K219 Gastro-esophageal reflux disease without esophagitis: Secondary | ICD-10-CM

## 2023-03-21 MED ORDER — FAMOTIDINE 40 MG PO TABS
40.0000 mg | ORAL_TABLET | Freq: Every day | ORAL | 1 refills | Status: DC
Start: 2023-03-21 — End: 2023-03-21

## 2023-03-21 MED ORDER — FAMOTIDINE 40 MG PO TABS: 40.0000 mg | ORAL_TABLET | Freq: Every day | ORAL | 1 refills | Status: DC

## 2023-03-25 DIAGNOSIS — H35372 Puckering of macula, left eye: Secondary | ICD-10-CM | POA: Insufficient documentation

## 2023-04-06 ENCOUNTER — Other Ambulatory Visit: Payer: Self-pay | Admitting: Pharmacist

## 2023-04-06 ENCOUNTER — Encounter: Payer: Self-pay | Admitting: Pharmacist

## 2023-04-06 NOTE — Progress Notes (Signed)
04/06/2023 Name: Brett [name] MRN: [id number] DOB: November 21, 1945  Chief Complaint  Patient presents with   Hyperlipidemia    Brett [name] is a 77 y.o. year old male who presented for a telephone visit.   They were referred to the pharmacist by their PCP for assistance in managing hyperlipidemia.   Subjective:  Care Team: Primary Care Provider: Park Meo, FNP ; Next Scheduled Visit: 09/05/22  Medication Access/Adherence  Current Pharmacy:  [id number] [place], [place] - [address] AT [facility] [address] [place] [address] Phone: [phone] Fax: [phone]  [facility] - [place], [place] - [address] [place] [address] Phone: [phone] Fax: [phone]  Patient reports affordability concerns with their medications: No  Patient reports access/transportation concerns to their pharmacy: No  Patient reports adherence concerns with their medications:  No    Hyperlipidemia/ASCVD Risk Reduction  Current lipid lowering medications: ezetimibe, fibrate for trigs Medications tried in the past: pravastatin; refuses injections  Antiplatelet regimen: n/a  ASCVD History: n/a Family History: n/a  Current physical activity: reports regular exercise  Current medication access support: [employer] ([email or web address]_IRX)   Clinical ASCVD: No  The 10-year ASCVD risk score (Arnett DK, et al., 2019) is: 31.9%   Values used to calculate the score:     Age: 89 years     Sex: Male     Is Non-Hispanic African American: No     Diabetic: No     Tobacco smoker: No     Systolic Blood Pressure: 131 mmHg     Is BP treated: Yes     HDL Cholesterol: 43 mg/dL     Total Cholesterol: 188 mg/dL   Objective:  Lab Results  Component Value Date   HGBA1C 5.8 (H) 01/29/2021    Lab Results  Component Value Date   CREATININE 0.94 03/07/2023   BUN 14 03/07/2023   NA 136 03/07/2023    K 3.8 03/07/2023   CL 94 (L) 03/07/2023   CO2 33 (H) 03/07/2023    Lab Results  Component Value Date   CHOL 188 03/07/2023   HDL 43 03/07/2023   LDLCALC 119 (H) 03/07/2023   TRIG 147 03/07/2023   CHOLHDL 4.4 03/07/2023    Medications Reviewed Today     Reviewed by Danella Maiers, [facility] (Pharmacist) on 04/06/23 at [phone]  Med List Status: <None>   Medication Order Taking? Sig Documenting Provider Last Dose Status Informant  acetaminophen (TYLENOL) 500 MG tablet [id number] No Take 1,000 mg by mouth every 6 (six) hours as needed for moderate pain. [provider] Taking Active Self  chlorthalidone (HYGROTON) 25 MG tablet [id number]  TAKE 1 TABLET BY MOUTH EVERY  MORNING Tolia, Sunit, DO  Active   colestipol (COLESTID) 1 g tablet [id number] No Take 1 tablet (1 g total) by mouth 2 (two) times daily. Park Meo, FNP Taking Active   diltiazem (CARDIZEM CD) 180 MG 24 hr capsule [id number] No Take 1 capsule (180 mg total) by mouth every morning. Tessa Lerner, DO Taking Active Self  ezetimibe (ZETIA) 10 MG tablet [id number]  Take 1 tablet (10 mg total) by mouth at bedtime. Park Meo, FNP  Active   famotidine (PEPCID) 40 MG tablet [id number]  Take 1 tablet (40 mg total) by mouth daily. Park Meo, FNP  Active   fenofibrate (TRICOR) 48 MG tablet [id number] No Take 1 tablet (  48 mg total) by mouth daily. Valentino Nose, NP Taking Active Self  losartan (COZAAR) 100 MG tablet [id number] No Take 1 tablet (100 mg total) by mouth every evening. Tessa Lerner, DO Taking Active Self  pantoprazole (PROTONIX) 40 MG tablet [id number] No Take 1 tablet (40 mg total) by mouth 2 (two) times daily. Park Meo, FNP Taking Active Self  tamsulosin (FLOMAX) 0.4 MG CAPS capsule [id number]  Take 1 capsule (0.4 mg total) by mouth daily. Park Meo, FNP  Active              Assessment/Plan:   Hyperlipidemia/ASCVD Risk Reduction: - Currently uncontrolled. LDL currently 119, goal  <100 - Reviewed long term complications of uncontrolled cholesterol - Reviewed dietary recommendations: patient reports his diet is normal/balanced  Reviewed lifestyle recommendations including exercise/patient already engages - Recommend to consider bempedoic acid (Nexletol)--patient respectfully declines; declines PCSK9 (I.e. Repatha injectable)  -Continue ezetimibe and fibrate   Follow Up Plan: PCP in April  Kieth Brightly, PharmD, BCACP, CPP Clinical Pharmacist, [facility]

## 2023-04-14 ENCOUNTER — Other Ambulatory Visit: Payer: Self-pay | Admitting: Family Medicine

## 2023-04-25 ENCOUNTER — Telehealth: Payer: Self-pay | Admitting: *Deleted

## 2023-04-25 ENCOUNTER — Encounter: Payer: Self-pay | Admitting: Cardiology

## 2023-04-25 NOTE — Telephone Encounter (Signed)
   Pre-operative Risk Assessment    Patient Name: Brett [name]  DOB: 07-11-1945 MRN: [id number]  DATE OF LAST VISIT: 01/25/23 DR. TOLIA DATE OF NEXT VISIT: 07/25/23 DR. TOLIA    Request for Surgical Clearance    Procedure:   LEFT REVERSE TOTAL SHOULDER ARTHROPLASTY  Date of Surgery:  Clearance TBD                                 Surgeon:  DR. Malon Kindle Surgeon's Group or Practice Name:  [facility] Phone number:  [phone]-[phone] MAZE Fax number:  [phone]   Type of Clearance Requested:   - Medical ; NONE INDICATED TO BE HELD   Type of Anesthesia:  General    Additional requests/questions:    Elpidio Anis   04/25/2023, 4:53 PM

## 2023-04-26 ENCOUNTER — Ambulatory Visit: Payer: Medicare HMO | Attending: Cardiology

## 2023-04-26 VITALS — HR 55 | Wt 191.8 lb

## 2023-04-26 DIAGNOSIS — I493 Ventricular premature depolarization: Secondary | ICD-10-CM | POA: Diagnosis not present

## 2023-04-26 NOTE — Telephone Encounter (Signed)
Dr. Odis Hollingshead recommended EKG closer to surgery during his office visit on 01/25/23. Please arrange a nurse visit for EKG in the office and then a virtual visit with APP.   Levi Aland, NP-C 04/26/2023, 8:59 AM [address], [address] Office [phone] Fax [phone]

## 2023-04-26 NOTE — Telephone Encounter (Signed)
S/w pt is aware pt will come in today for a nurse visit. Pt will need a EKG per Dr. Odis Hollingshead for pre op clearance. S/w triage to make aware a 2:00 appt was made today and will send to triage pool to [facility]. Will remove from pre op pool.

## 2023-04-26 NOTE — Progress Notes (Signed)
   Nurse Visit   Date of Encounter: 04/26/2023 ID: Brett [name], DOB 1946-04-03, MRN [id number]  PCP:  Park Meo, FNP   [facility] Providers Cardiologist:  None      Visit Details   VS:  Pulse (!) 55   Wt 191 lb 12.8 oz (87 kg)   BMI 29.16 kg/m  , BMI Body mass index is 29.16 kg/m.  Wt Readings from Last 3 Encounters:  04/26/23 191 lb 12.8 oz (87 kg)  03/07/23 181 lb (82.1 kg)  02/25/23 180 lb (81.6 kg)     Reason for visit: EKG Performed today:   , Vitals, EKG, Provider consulted:Dr. Ladona Ridgel, and Education Changes (medications, testing, etc.) : None Length of Visit: 10 minutes    Medications Adjustments/Labs and Tests Ordered: Orders Placed This Encounter  Procedures   EKG 12-Lead   EKG 12-Lead   EKG 12-Lead   Pre-Op clearancec  Signed, Daegen Berrocal Merilynn Finland, LPN  02/72/5366 3:06 PM

## 2023-04-27 NOTE — Telephone Encounter (Signed)
   Patient Name: Brett [name]  DOB: 01-07-1946 MRN: [id number]  Primary Cardiologist: Odis Hollingshead  Chart reviewed as part of pre-operative protocol coverage. Given past medical history and time since last visit, based on ACC/AHA guidelines, [name] is at acceptable risk for the planned procedure without further cardiovascular testing.   EKG completed on 04/26/2023. Sinus bradycardia, unchanged from prior EKG.  The patient was advised that if he develops new symptoms prior to surgery to contact our office to arrange for a follow-up visit, and he verbalized understanding.  I will route this recommendation to the requesting party via Epic fax function and remove from pre-op pool.  Please call with questions.  Joni Reining, NP 04/27/2023, 7:57 AM

## 2023-05-22 ENCOUNTER — Encounter: Payer: Self-pay | Admitting: Cardiology

## 2023-05-23 NOTE — Telephone Encounter (Signed)
 Spoke with pt and discussed with him that Dr. Odis Hollingshead recommends him coming in to be evaluated. Pt has been scheduled with Robin Searing, NP for 05/24/23 at 2:20 PM. Pt is aware. Pt advised on ED precautions in the mean time and pt verbalized understanding.

## 2023-05-23 NOTE — Progress Notes (Signed)
 Cardiology Office Note    Patient Name: Brett [name] Date of Encounter: 05/24/2023  Primary Care Provider:  Kayla Jeoffrey RAMAN, FNP Primary Cardiologist:  None Primary Electrophysiologist: None   Past Medical History    Past Medical History:  Diagnosis Date   Allergy    seasonal   Anemia 06/2019   iron deficiency   Arthritis    Cataract    Diverticulosis    GERD (gastroesophageal reflux disease)    Heart murmur    Hiatal hernia 2014   Dr. Janczak ([place] )   History of hiatal hernia    Hyperlipidemia    Hypertension    IBS (irritable colon syndrome)    Irregular heart beat    Pneumonia    hx. of   PONV (postoperative nausea and vomiting)    with gallbladder surgery   SCC (squamous cell carcinoma) 01/02/2019   R [id number]   Sleep apnea    cpap   Vertigo     History of Present Illness  Brett [name] is a 78 y.o. male with a PMH of HTN, HLD with statin intolerance, aortic atherosclerosis OSA (on CPAP), former tobacco abuse, GERD, IDA, mitral regurgitation, PVCs with 3.8% burden who presents today for complaint of dizziness with shortness of breath and lower extremity swelling.  [name] was seen initially by Dr.Tolia referral of PCP for evaluation of heart murmur in 12/2021.  Completed 2D echo that showed preserved EF and grade 1 DD with mitral regurgitation.  He completed a GXT stress test that was low risk with no evidence of ischemia.  Reported complaints of palpitations and underwent Zio patch that showed 3.8% PVC burden.  He was started on Cardizem  with amlodipine  discontinued.  Experienced control of PVCs but BP was not at goal.  He was started on chlorthalidone  along with losartan .  He was last seen on 01/25/2023 for follow-up visit and denied any anginal symptoms he was doing well with low-dose of Cardizem .  He was provided clearance for upcoming shoulder surgery that will be completed this month.  Had a 2D echo completed 02/11/2023 that showed EF of 55-60% with no RWMA  and with mild calcification of AV and moderate MR.[name]  He contacted our office on 05/21/2022 with complaint of shortness of breath, dizziness and bilateral lower extremity swelling.  Mr. [name] presents today with his wife for complaint of shortness of breath, dizziness, and swelling. He reports a significant weight gain of 15 pounds since October, which he attributes to reduced activity due to a back operation and increased consumption of salty foods over the Christmas period. The patient's shortness of breath and dizziness have been particularly noticeable when carrying out physical activities such as holding firewood. He also reports a tightness in his belly, which is unusual for him.  He reports some constipation with chlorthalidone . He is scheduled for shoulder surgery at the end of the month. Patient denies chest pain, palpitations, PND, orthopnea, nausea, vomiting, dizziness, syncope, edema, weight gain, or early satiety.   Review of Systems  Please see the history of present illness.    All other systems reviewed and are otherwise negative except as noted above.  Physical Exam    Wt Readings from Last 3 Encounters:  05/24/23 196 lb 9.6 oz (89.2 kg)  04/26/23 191 lb 12.8 oz (87 kg)  03/07/23 181 lb (82.1 kg)   VS: Vitals:   05/24/23 1428  BP: (!) 128/58  Pulse: 63  SpO2: 95%  ,Body mass index is 29.89  kg/m. GEN: Well nourished, well developed in no acute distress Neck: Slight JVD distention no carotid bruits Pulmonary: Clear to auscultation without rales, wheezing or rhonchi  Cardiovascular: Normal rate. Regular rhythm. Normal S1. Normal S2.   Murmurs: Systolic murmur present ABDOMEN: Soft, non-tender, slight distention EXTREMITIES: Trace edema present in lower extremities  EKG/LABS/ Recent Cardiac Studies   ECG personally reviewed by me today -sinus rhythm with rate of 63 bpm and no acute changes consistent with previous EKG.  Risk Assessment/Calculations:          Lab  Results  Component Value Date   WBC 9.3 03/07/2023   HGB 12.3 (L) 03/07/2023   HCT 38.2 (L) 03/07/2023   MCV 85.1 03/07/2023   PLT 406 (H) 03/07/2023   Lab Results  Component Value Date   CREATININE 0.94 03/07/2023   BUN 14 03/07/2023   NA 136 03/07/2023   K 3.8 03/07/2023   CL 94 (L) 03/07/2023   CO2 33 (H) 03/07/2023   Lab Results  Component Value Date   CHOL 188 03/07/2023   HDL 43 03/07/2023   LDLCALC 119 (H) 03/07/2023   TRIG 147 03/07/2023   CHOLHDL 4.4 03/07/2023    Lab Results  Component Value Date   HGBA1C 5.8 (H) 01/29/2021   Assessment & Plan    1.  Dyspnea on exertion: -Most recent 2D echo completed showing preserved EF and mild to moderate MR Recent weight gain of 15 lbs, shortness of breath, dizziness, and tightness in the abdomen. No significant changes in diet or medication. Decreased physical activity due to recent back surgery. Mild fluid in lungs on exam. Mitral regurgitation and calcification on aortic valve noted on previous echocardiogram. -Start Lasix  20mg  BID for 4 days, then 20mg  daily. -Start Potassium 10mEq BID for 4 days, then 10mEq daily. -Order BNP, CBC, BMP, and Magnesium . -Repeat echocardiogram. -Order Myoview  stress test. -Follow up in 2 weeks.  2.  Nonrheumatic MR: -Patient's last 2D echo was completed 02/11/2023 showing stable EF of 55 to 60% with mild to moderate MR -We will repeat 2D echo due to new onset of shortness of breath and swelling -Continue diuretic therapy with Lasix  as noted above and BP control with losartan  100 mg, chlorthalidone  25 mg and Cardizem  180 mg  3.  Lower extremity edema: -Trace lower extremity edema present -Patient advised to monitor salt intake and elevate extremities when dependent  4.  History of PVCs: -Per patient under control with no symptomatic complaints -Continue Cardizem  180 mg daily  5.  Essential hypertension: -Patient's blood pressure today was 128/58 Continue losartan  100 mg,  chlorthalidone  25 mg and Cardizem  180 mg    Disposition: Follow-up with None or APP in 2 weeks   Signed, Wyn Raddle, Jackee Shove, NP 05/24/2023, 2:41 PM [facility]

## 2023-05-24 ENCOUNTER — Ambulatory Visit: Payer: Medicare HMO | Attending: Nurse Practitioner | Admitting: Nurse Practitioner

## 2023-05-24 ENCOUNTER — Encounter: Payer: Self-pay | Admitting: Nurse Practitioner

## 2023-05-24 VITALS — BP 128/58 | HR 63 | Ht 68.0 in | Wt 196.6 lb

## 2023-05-24 DIAGNOSIS — I340 Nonrheumatic mitral (valve) insufficiency: Secondary | ICD-10-CM

## 2023-05-24 DIAGNOSIS — E781 Pure hyperglyceridemia: Secondary | ICD-10-CM | POA: Diagnosis not present

## 2023-05-24 DIAGNOSIS — I10 Essential (primary) hypertension: Secondary | ICD-10-CM

## 2023-05-24 DIAGNOSIS — I493 Ventricular premature depolarization: Secondary | ICD-10-CM

## 2023-05-24 DIAGNOSIS — R0609 Other forms of dyspnea: Secondary | ICD-10-CM | POA: Diagnosis not present

## 2023-05-24 MED ORDER — POTASSIUM CHLORIDE CRYS ER 10 MEQ PO TBCR: 10.0000 meq | EXTENDED_RELEASE_TABLET | Freq: Every day | ORAL | 3 refills | Status: DC

## 2023-05-24 MED ORDER — FUROSEMIDE 20 MG PO TABS: 20.0000 mg | ORAL_TABLET | Freq: Every day | ORAL | 3 refills | Status: DC

## 2023-05-24 NOTE — Patient Instructions (Signed)
 Medication Instructions:   START Lasix  one (1) tablet by mouth ( 20 mg) twice daily X 4 days than one (1) tablet ( 20 mg) by mouth daily.   START K-dur one (1) tablet by mouth ( 10 mEq) twice daily X 4 days than one (1) tablet ( 10 mEq) by mouth daily.   *If you need a refill on your cardiac medications before your next appointment, please call your pharmacy*   Lab Work:  TODAY!!!!! BMET/MAG/PRO BNP/CBC  If you have labs (blood work) drawn today and your tests are completely normal, you will receive your results only by: MyChart Message (if you have MyChart) OR A paper copy in the mail If you have any lab test that is abnormal or we need to change your treatment, we will call you to review the results.   Testing/Procedures:  Your physician has requested that you have an echocardiogram. Echocardiography is a painless test that uses sound waves to create images of your heart. It provides your doctor with information about the size and shape of your heart and how well your heart's chambers and valves are working. This procedure takes approximately one hour. There are no restrictions for this procedure. Please do NOT wear cologne,  aftershave, or lotions (deodorant is allowed). Please arrive 15 minutes prior to your appointment time.  Please note: We ask at that you not bring children with you during ultrasound (echo/ vascular) testing. Due to room size and safety concerns, children are not allowed in the ultrasound rooms during exams. Our front office staff cannot provide observation of children in our lobby area while testing is being conducted. An adult accompanying a patient to their appointment will only be allowed in the ultrasound room at the discretion of the ultrasound technician under special circumstances. We apologize for any inconvenience.  You are scheduled for a Myocardial Perfusion Imaging Study on Monday, January 13 @  at 12:45 pm.   Please arrive 15 minutes prior to your  appointment time for registration and insurance purposes.   The test will take approximately 3 to 4 hours to complete; you may bring reading material. If someone comes with you to your appointment, they will need to remain in the main lobby due to limited space in the testing area.   How to prepare for your Myocardial Perfusion test:   Do not eat or drink 3 hours prior to your test, except you may have water.    Do not consume products containing caffeine (regular or decaffeinated) 12 hours prior to your test (ex: coffee, chocolate, soda, tea)   Do bring a list of your current medications with you. If not listed below, you may take your medications as normal.   Bring any held medication to your appointment, as you may be required to take it once the test is complete.   Do wear comfortable clothes (no  overalls) and walking shoes. Tennis shoes are preferred. No open toed shoes.  Do not wear cologne, aftershave or lotions (deodorant is allowed).   If these instructions are not followed, you test will have to be rescheduled.   Please report to [address] for your test. If you have questions or concerns about your appointment, please call the Nuclear Lab at #[phone].  If you cannot keep your appointment, please provide 24 hour notification to the Nuclear lab to avoid a possible $50 charge to your account.      Follow-Up: At [facility], you and  your health needs are our priority.  As part of our continuing mission to provide you with exceptional heart care, we have created designated Provider Care Teams.  These Care Teams include your primary Cardiologist (physician) and Advanced Practice Providers (APPs -  Physician Assistants and Nurse Practitioners) who all work together to provide you with the care you need, when you need it.  We recommend signing up for the patient portal called MyChart.  Sign up information is provided on this After Visit  Summary.  MyChart is used to connect with patients for Virtual Visits (Telemedicine).  Patients are able to view lab/test results, encounter notes, upcoming appointments, etc.  Non-urgent messages can be sent to your provider as well.   To learn more about what you can do with MyChart, go to [email or web address].    Your next appointment:   3 week(s)  Provider:   Jackee Alberts, NP         Other Instructions  DASH Eating Plan DASH stands for Dietary Approaches to Stop Hypertension. The DASH eating plan is a healthy eating plan that has been shown to: Lower high blood pressure (hypertension). Reduce your risk for type 2 diabetes, heart disease, and stroke. Help with weight loss. What are tips for following this plan? Reading food labels Check food labels for the amount of salt (sodium) per serving. Choose foods with less than 5 percent of the Daily Value (DV) of sodium. In general, foods with less than 300 milligrams (mg) of sodium per serving fit into this eating plan. To find whole grains, look for the word whole as the first word in the ingredient list. Shopping Buy products labeled as low-sodium or no salt added. Buy fresh foods. Avoid canned foods and pre-made or frozen meals. Cooking Try not to add salt when you cook. Use salt-free seasonings or herbs instead of table salt or sea salt. Check with your health care provider or pharmacist before using salt substitutes. Do not fry foods. Cook foods in healthy ways, such as baking, boiling, grilling, roasting, or broiling. Cook using oils that are good for your heart. These include olive, canola, avocado, soybean, and sunflower oil. Meal planning  Eat a balanced diet. This should include: 4 or more servings of fruits and 4 or more servings of vegetables each day. Try to fill half of your plate with fruits and vegetables. 6-8 servings of whole grains each day. 6 or less servings of lean meat, poultry, or fish each day. 1 oz is 1  serving. A 3 oz (85 g) serving of meat is about the same size as the palm of your hand. One egg is 1 oz (28 g). 2-3 servings of low-fat dairy each day. One serving is 1 cup (237 mL). 1 serving of nuts, seeds, or beans 5 times each week. 2-3 servings of heart-healthy fats. Healthy fats called omega-3 fatty acids are found in foods such as walnuts, flaxseeds, fortified milks, and eggs. These fats are also found in cold-water fish, such as sardines, salmon, and mackerel. Limit how much you eat of: Canned or prepackaged foods. Food that is high in trans fat, such as fried foods. Food that is high in saturated fat, such as fatty meat. Desserts and other sweets, sugary drinks, and other foods with added sugar. Full-fat dairy products. Do not salt foods before eating. Do not eat more than 4 egg yolks a week. Try to eat at least 2 vegetarian meals a week. Eat more home-cooked food and less restaurant, buffet,  and fast food. Lifestyle When eating at a restaurant, ask if your food can be made with less salt or no salt. If you drink alcohol: Limit how much you have to: 0-1 drink a day if you are male. 0-2 drinks a day if you are male. Know how much alcohol is in your drink. In the [place] one drink is one 12 oz bottle of beer (355 mL), one 5 oz glass of wine (148 mL), or one 1 oz glass of hard liquor (44 mL). General information Avoid eating more than 2,300 mg of salt a day. If you have hypertension, you may need to reduce your sodium intake to 1,500 mg a day. Work with your provider to stay at a healthy body weight or lose weight. Ask what the best weight range is for you. On most days of the week, get at least 30 minutes of exercise that causes your heart to beat faster. This may include walking, swimming, or biking. Work with your provider or dietitian to adjust your eating plan to meet your specific calorie needs. What foods should I eat? Fruits All fresh, dried, or frozen fruit. Canned fruits  that are in their natural juice and do not have sugar added to them. Vegetables Fresh or frozen vegetables that are raw, steamed, roasted, or grilled. Low-sodium or reduced-sodium tomato and vegetable juice. Low-sodium or reduced-sodium tomato sauce and tomato paste. Low-sodium or reduced-sodium canned vegetables. Grains Whole-grain or whole-wheat bread. Whole-grain or whole-wheat pasta. Brown rice. [place] Madeira. Bulgur. Whole-grain and low-sodium cereals. Pita bread. Low-fat, low-sodium crackers. Whole-wheat flour tortillas. Meats and other proteins Skinless chicken or turkey. Ground chicken or turkey. Pork with fat trimmed off. Fish and seafood. Egg whites. Dried beans, peas, or lentils. Unsalted nuts, nut butters, and seeds. Unsalted canned beans. Lean cuts of beef with fat trimmed off. Low-sodium, lean precooked or cured meat, such as sausages or meat loaves. Dairy Low-fat (1%) or fat-free (skim) milk. Reduced-fat, low-fat, or fat-free cheeses. Nonfat, low-sodium ricotta or cottage cheese. Low-fat or nonfat yogurt. Low-fat, low-sodium cheese. Fats and oils Soft margarine without trans fats. Vegetable oil. Reduced-fat, low-fat, or light mayonnaise and salad dressings (reduced-sodium). Canola, safflower, olive, avocado, soybean, and sunflower oils. Avocado. Seasonings and condiments Herbs. Spices. Seasoning mixes without salt. Other foods Unsalted popcorn and pretzels. Fat-free sweets. The items listed above may not be all the foods and drinks you can have. Talk to a dietitian to learn more. What foods should I avoid? Fruits Canned fruit in a light or heavy syrup. Fried fruit. Fruit in cream or butter sauce. Vegetables Creamed or fried vegetables. Vegetables in a cheese sauce. Regular canned vegetables that are not marked as low-sodium or reduced-sodium. Regular canned tomato sauce and paste that are not marked as low-sodium or reduced-sodium. Regular tomato and vegetable juices that are not  marked as low-sodium or reduced-sodium. Dene. Olives. Grains Baked goods made with fat, such as croissants, muffins, or some breads. Dry pasta or rice meal packs. Meats and other proteins Fatty cuts of meat. Ribs. Fried meat. [place]. Bologna, salami, and other precooked or cured meats, such as sausages or meat loaves, that are not lean and low in sodium. Fat from the back of a pig (fatback). Bratwurst. Salted nuts and seeds. Canned beans with added salt. Canned or smoked fish. Whole eggs or egg yolks. Chicken or turkey with skin. Dairy Whole or 2% milk, cream, and half-and-half. Whole or full-fat cream cheese. Whole-fat or sweetened yogurt. Full-fat cheese. Nondairy creamers. Whipped toppings.  Processed cheese and cheese spreads. Fats and oils Butter. Stick margarine. Lard. Shortening. Ghee. Bacon fat. Tropical oils, such as coconut, palm kernel, or palm oil. Seasonings and condiments Onion salt, garlic salt, seasoned salt, table salt, and sea salt. Worcestershire sauce. Tartar sauce. Barbecue sauce. Teriyaki sauce. Soy sauce, including reduced-sodium soy sauce. Steak sauce. Canned and packaged gravies. Fish sauce. Oyster sauce. Cocktail sauce. Store-bought horseradish. Ketchup. Mustard. Meat flavorings and tenderizers. Bouillon cubes. Hot sauces. Pre-made or packaged marinades. Pre-made or packaged taco seasonings. Relishes. Regular salad dressings. Other foods Salted popcorn and pretzels. The items listed above may not be all the foods and drinks you should avoid. Talk to a dietitian to learn more. Where to find more information National Heart, Lung, and Blood Institute (NHLBI): buffalodrycleaner.gl American Heart Association (AHA): heart.org Academy of Nutrition and Dietetics: eatright.org National Kidney Foundation (NKF): kidney.org This information is not intended to replace advice given to you by your health care provider. Make sure you discuss any questions you have with your health care  provider. Document Revised: 05/20/2022 Document Reviewed: 05/20/2022 Elsevier Patient Education  2024 Elsevier Inc.  Recommend weighing daily and keeping a log. Please call our office if you have weight gain of 2 pounds overnight or 5 pounds in 1 week.   Date  Time Weight                                            Adopting a Healthy Lifestyle.   Weight: Know what a healthy weight is for you (roughly BMI <25) and aim to maintain this. You can calculate your body mass index on your smart phone. Unfortunately, this is not the most accurate measure of healthy weight, but it is the simplest measurement to use. A more accurate measurement involves body scanning which measures lean muscle, fat tissue and bony density. We do not have this equipment at [facility].    Diet: Aim for 7+ servings of fruits and vegetables daily Limit animal fats in diet for cholesterol and heart health - choose grass fed whenever available Avoid highly processed foods (fast food burgers, tacos, fried chicken, pizza, hot dogs, french fries)  Saturated fat comes in the form of butter, lard, coconut oil, margarine, partially hydrogenated oils, and fat in meat. These increase your risk of cardiovascular disease.  Use healthy plant oils, such as olive, canola, soy, corn, sunflower and peanut.  Whole foods such as fruits, vegetables and whole grains have fiber  Men need > 38 grams of fiber per day Women need > 25 grams of fiber per day  Load up on vegetables and fruits - one-half of your plate: Aim for color and variety, and remember that potatoes dont count. Go for whole grains - one-quarter of your plate: Whole wheat, barley, wheat berries, quinoa, oats, brown rice, and foods made with them. If you want pasta, go with whole wheat pasta. Protein power - one-quarter of your plate: Fish, chicken, beans, and nuts are all healthy, versatile protein sources. Limit red meat. You need carbohydrates for energy! The  type of carbohydrate is more important than the amount. Choose carbohydrates such as vegetables, fruits, whole grains, beans, and nuts in the place of white rice, white pasta, potatoes (baked or fried), macaroni and cheese, cakes, cookies, and donuts.  If youre thirsty, drink water. Coffee and tea are good in moderation, but skip sugary drinks and  limit milk and dairy products to one or two daily servings. Keep sugar intake at 6 teaspoons or 24 grams or LESS       Exercise: Aim for 150 min of moderate intensity exercise weekly for heart health, and weights twice weekly for bone health Stay active - any steps are better than no steps! Aim for 7-9 hours of sleep daily

## 2023-05-25 LAB — BASIC METABOLIC PANEL
BUN/Creatinine Ratio: 14 (ref 10–24)
BUN: 15 mg/dL (ref 8–27)
CO2: 26 mmol/L (ref 20–29)
Calcium: 9.2 mg/dL (ref 8.6–10.2)
Chloride: 98 mmol/L (ref 96–106)
Creatinine, Ser: 1.08 mg/dL (ref 0.76–1.27)
Glucose: 84 mg/dL (ref 70–99)
Potassium: 3.9 mmol/L (ref 3.5–5.2)
Sodium: 139 mmol/L (ref 134–144)
eGFR: 71 mL/min/{1.73_m2} (ref >59)

## 2023-05-25 LAB — CBC
Hematocrit: 39.6 % (ref 37.5–51.0)
Hemoglobin: 12.9 g/dL — ABNORMAL LOW (ref 13.0–17.7)
MCH: 28 pg (ref 26.6–33.0)
MCHC: 32.6 g/dL (ref 31.5–35.7)
MCV: 86 fL (ref 79–97)
Platelets: 341 10*3/uL (ref 150–450)
RBC: 4.6 x10E6/uL (ref 4.14–5.80)
RDW: 12 % (ref 11.6–15.4)
WBC: 8.9 10*3/uL (ref 3.4–10.8)

## 2023-05-25 LAB — PRO B NATRIURETIC PEPTIDE

## 2023-05-25 LAB — MAGNESIUM: Magnesium: 2.4 mg/dL — ABNORMAL HIGH (ref 1.6–2.3)

## 2023-05-26 ENCOUNTER — Telehealth (HOSPITAL_COMMUNITY): Payer: Self-pay | Admitting: *Deleted

## 2023-05-26 ENCOUNTER — Ambulatory Visit (HOSPITAL_COMMUNITY): Payer: Medicare HMO | Attending: Cardiology

## 2023-05-26 DIAGNOSIS — I493 Ventricular premature depolarization: Secondary | ICD-10-CM

## 2023-05-26 DIAGNOSIS — R0609 Other forms of dyspnea: Secondary | ICD-10-CM

## 2023-05-26 DIAGNOSIS — E781 Pure hyperglyceridemia: Secondary | ICD-10-CM

## 2023-05-26 DIAGNOSIS — I340 Nonrheumatic mitral (valve) insufficiency: Secondary | ICD-10-CM

## 2023-05-26 DIAGNOSIS — I10 Essential (primary) hypertension: Secondary | ICD-10-CM

## 2023-05-26 LAB — ECHOCARDIOGRAM COMPLETE
Area-P 1/2: 2.49 cm2
P 1/2 time: 694 ms
S' Lateral: 2.2 cm

## 2023-05-26 NOTE — Telephone Encounter (Signed)
 Patient given detailed instructions per Myocardial Perfusion Study Information Sheet for the test on 05/30/2023 at 12:45. Patient notified to arrive 15 minutes early and that it is imperative to arrive on time for appointment to keep from having the test rescheduled.  If you need to cancel or reschedule your appointment, please call the office within 24 hours of your appointment. . Patient verbalized understanding.Brett [name]

## 2023-05-30 ENCOUNTER — Ambulatory Visit (HOSPITAL_COMMUNITY): Payer: Medicare HMO | Attending: Cardiovascular Disease

## 2023-05-30 DIAGNOSIS — E781 Pure hyperglyceridemia: Secondary | ICD-10-CM

## 2023-05-30 DIAGNOSIS — I493 Ventricular premature depolarization: Secondary | ICD-10-CM | POA: Diagnosis present

## 2023-05-30 DIAGNOSIS — I10 Essential (primary) hypertension: Secondary | ICD-10-CM | POA: Diagnosis present

## 2023-05-30 DIAGNOSIS — I340 Nonrheumatic mitral (valve) insufficiency: Secondary | ICD-10-CM | POA: Diagnosis present

## 2023-05-30 DIAGNOSIS — R0609 Other forms of dyspnea: Secondary | ICD-10-CM

## 2023-05-30 LAB — MYOCARDIAL PERFUSION IMAGING
Angina Index: 0
Duke Treadmill Score: 7
Estimated workload: 7.2
Exercise duration (min): 6 min
Exercise duration (sec): 36 s
LV dias vol: 76 mL (ref 62–150)
LV sys vol: 29 mL
MPHR: 144 {beats}/min
Nuc Stress EF: 62 %
Peak HR: 130 {beats}/min
Percent HR: 90 %
Rest HR: 55 {beats}/min
Rest Nuclear Isotope Dose: 10.6 mCi
SDS: 1
SRS: 0
SSS: 1
ST Depression (mm): 0 mm
Stress Nuclear Isotope Dose: 30.1 mCi
TID: 0.83

## 2023-05-30 MED ORDER — TECHNETIUM TC 99M TETROFOSMIN IV KIT
30.1000 | PACK | Freq: Once | INTRAVENOUS | Status: AC | PRN
  Administered 2023-05-30: 30.1 via INTRAVENOUS

## 2023-05-30 MED ORDER — TECHNETIUM TC 99M TETROFOSMIN IV KIT
10.6000 | PACK | Freq: Once | INTRAVENOUS | Status: AC | PRN
  Administered 2023-05-30: 10.6 via INTRAVENOUS

## 2023-05-30 NOTE — H&P (Signed)
 Patient's anticipated LOS is less than 2 midnights, meeting these requirements: - Younger than 54 - Lives within 1 hour of care - Has a competent adult at home to recover with post-op recover - NO history of  - Chronic pain requiring opiods  - Diabetes  - Coronary Artery Disease  - Heart failure  - Heart attack  - Stroke  - DVT/VTE  - Cardiac arrhythmia  - Respiratory Failure/COPD  - Renal failure  - Anemia  - Advanced Liver disease     Brett [name] is an 78 y.o. male.    Chief Complaint: left shoulder pain  HPI: Pt is a 78 y.o. male complaining of left shoulder pain for multiple years. Pain had continually increased since the beginning. X-rays in the clinic show end-stage arthritic changes of the left shoulder. Pt has tried various conservative treatments which have failed to alleviate their symptoms, including injections and therapy. Various options are discussed with the patient. Risks, benefits and expectations were discussed with the patient. Patient understand the risks, benefits and expectations and wishes to proceed with surgery.   PCP:  Kayla Jeoffrey RAMAN, FNP  D/C Plans: Home  PMH: Past Medical History:  Diagnosis Date   Allergy    seasonal   Anemia 06/2019   iron deficiency   Arthritis    Cataract    Diverticulosis    GERD (gastroesophageal reflux disease)    Heart murmur    Hiatal hernia 2014   Dr. Paulett ([place] )   History of hiatal hernia    Hyperlipidemia    Hypertension    IBS (irritable colon syndrome)    Irregular heart beat    Pneumonia    hx. of   PONV (postoperative nausea and vomiting)    with gallbladder surgery   SCC (squamous cell carcinoma) 01/02/2019   R [id number]   Sleep apnea    cpap   Vertigo     PSH: Past Surgical History:  Procedure Laterality Date   CARPAL TUNNEL RELEASE Bilateral 05/2022   CHOLECYSTECTOMY  2004   Post-cholecystectomy syndrome    COLONOSCOPY     ESOPHAGOGASTRODUODENOSCOPY     EYE SURGERY      cataract   FRACTURE SURGERY  2006   fx. wrist   JOINT REPLACEMENT     KNEE SURGERY Left 2020   LUMBAR DISC SURGERY     L4-L5   rhino plasty     deviated septum   SHOULDER SURGERY  2011   rotator cuff   TOTAL HIP ARTHROPLASTY Right 12/13/2016   Procedure: RIGHT TOTAL HIP ARTHROPLASTY ANTERIOR APPROACH;  Surgeon: Fidel Rogue, MD;  Location: [facility] OR;  Service: Orthopedics;  Laterality: Right;   TOTAL SHOULDER ARTHROPLASTY Right 04/15/2017   Procedure: RIGHT TOTAL SHOULDER ARTHROPLASTY;  Surgeon: Kay Kemps, MD;  Location: [facility] OR;  Service: Orthopedics;  Laterality: Right;   TYMPANOSTOMY TUBE PLACEMENT Left 2020   UPPER GASTROINTESTINAL ENDOSCOPY     VASECTOMY  1980    Social History:  reports that he quit smoking about 13 years ago. His smoking use included cigarettes and cigars. He started smoking about 62 years ago. He has a 102 pack-year smoking history. He has never used smokeless tobacco. He reports that he does not drink alcohol and does not use drugs. BMI: Estimated body mass index is 29.89 kg/m as calculated from the following:   Height as of 05/24/23: 5' 8 (1.727 m).   Weight as of 05/24/23: 89.2 kg.  Lab Results  Component Value Date  ALBUMIN  4.1 09/20/2016   Diabetes: Patient does not have a diagnosis of diabetes.     Smoking Status:      Allergies:  Allergies  Allergen Reactions   Statins     myalgias   Tramadol Nausea Only    Medications: No current facility-administered medications for this encounter.   Current Outpatient Medications  Medication Sig Dispense Refill   acetaminophen  (TYLENOL ) 500 MG tablet Take 1,000 mg by mouth every 6 (six) hours as needed for moderate pain.     baclofen  (LIORESAL ) 10 MG tablet Take 10 mg by mouth 2 (two) times daily.     chlorthalidone  (HYGROTON ) 25 MG tablet TAKE 1 TABLET BY MOUTH EVERY  MORNING 90 tablet 3   colestipol  (COLESTID ) 1 g tablet Take 1 tablet (1 g total) by mouth 2 (two) times daily. 180 tablet 3    diltiazem  (CARDIZEM  CD) 180 MG 24 hr capsule Take 1 capsule (180 mg total) by mouth every morning. 90 capsule 3   ezetimibe  (ZETIA ) 10 MG tablet Take 1 tablet (10 mg total) by mouth at bedtime. 90 tablet 1   famotidine  (PEPCID ) 40 MG tablet Take 1 tablet (40 mg total) by mouth daily. 90 tablet 1   fenofibrate  (TRICOR ) 48 MG tablet Take 1 tablet (48 mg total) by mouth daily. 90 tablet 3   furosemide  (LASIX ) 20 MG tablet Take 1 tablet (20 mg total) by mouth daily. 90 tablet 3   losartan  (COZAAR ) 100 MG tablet Take 1 tablet (100 mg total) by mouth every evening. 90 tablet 3   pantoprazole  (PROTONIX ) 40 MG tablet Take 1 tablet (40 mg total) by mouth 2 (two) times daily. 180 tablet 3   potassium chloride  (KLOR-CON  M) 10 MEQ tablet Take 1 tablet (10 mEq total) by mouth daily. 90 tablet 3   tamsulosin  (FLOMAX ) 0.4 MG CAPS capsule TAKE 1 CAPSULE BY MOUTH DAILY 90 capsule 3    No results found for this or any previous visit (from the past 48 hours). No results found.  ROS: Pain with rom of the left upper extremity  Physical Exam: Alert and oriented 78 y.o. male in no acute distress Cranial nerves 2-12 intact Cervical spine: full rom with no tenderness, nv intact distally Chest: active breath sounds bilaterally, no wheeze rhonchi or rales Heart: regular rate and rhythm, no murmur Abd: non tender non distended with active bowel sounds Hip is stable with rom  Left shoulder painful and weak rom Nv intact distally  Assessment/Plan Assessment: left shoulder cuff arthropathy  Plan:  Patient will undergo a left reverse total shoulder by Dr. Kay at [facility] Risks benefits and expectations were discussed with the patient. Patient understand risks, benefits and expectations and wishes to proceed. Preoperative templating of the joint replacement has been completed, documented, and submitted to the Operating Room personnel in order to optimize intra-operative equipment management.   Arvella Fireman PA-C,  MPAS [facility] is now [employer] [address], [place], [place] [address] Phone: [phone] [email or web address] Facebook  [employer]

## 2023-06-05 ENCOUNTER — Encounter: Payer: Self-pay | Admitting: Family Medicine

## 2023-06-05 NOTE — Progress Notes (Unsigned)
Cardiology Office Note    Patient Name: Brett [name] Date of Encounter: 06/06/2023  Primary Care Provider:  Park Meo, FNP Primary Cardiologist:  None Primary Electrophysiologist: None   Past Medical History    Past Medical History:  Diagnosis Date   Allergy    seasonal   Anemia 06/2019   iron deficiency   Arthritis    Cataract    Diverticulosis    GERD (gastroesophageal reflux disease)    Heart murmur    Hiatal hernia 2014   Dr. Elisabeth Cara [facility])   History of hiatal hernia    Hyperlipidemia    Hypertension    IBS (irritable colon syndrome)    Irregular heart beat    Pneumonia    hx. of   PONV (postoperative nausea and vomiting)    with gallbladder surgery   SCC (squamous cell carcinoma) 01/02/2019   R temple-txpbx   Sleep apnea    cpap   Vertigo     History of Present Illness  Brett [name] is a 78 y.o. male with a PMH of HTN, HLD with statin intolerance, aortic atherosclerosis OSA (on CPAP), former tobacco abuse, GERD, IDA, mitral regurgitation, PVCs with 3.8% burden who presents today for 2-week follow-up.  Brett [name] was seen on 05/24/2023 with complaint of dizziness and shortness of breath with lower extremity swelling.  He reported a significant weight gain of 15 pounds since October which he attributed to consumption of salty foods during Christmas.  He also reported shortness of breath with activities such as holding firewood which was unusual for him.  He was started on Lasix 20 mg twice daily x 4 days and also underwent a Myoview stress test and 2D echo.  The Myoview results showed no evidence of ischemia with good BP response activity.  2D echo results showed normal EF with diastolic dysfunction and mild dilation of the aortic root measuring 39 mm.  He presents today for follow-up and preop clearance for reverse total arthroplasty.  Brett [name] presents today for 2-week follow-up. He had previously presented with shortness of breath and swelling, which  improved with Lasix.  He completed an echocardiogram that showed no new abnormalities.  The patient also underwent a stress test, which showed no ischemia or decreased blood flow in the heart, and a normal blood pressure response to exercise. The patient reported no chest pain or problems with activity during the stress test. The patient's functional status was assessed through a series of questions, all of which the patient answered affirmatively, indicating a good functional status.Patient denies chest pain, palpitations, dyspnea, PND, orthopnea, nausea, vomiting, dizziness, syncope, edema, weight gain, or early satiety.  Review of Systems  Please see the history of present illness.    All other systems reviewed and are otherwise negative except as noted above.  Physical Exam    Wt Readings from Last 3 Encounters:  06/06/23 191 lb (86.6 kg)  05/30/23 196 lb (88.9 kg)  05/24/23 196 lb 9.6 oz (89.2 kg)   VS: Vitals:   06/06/23 0840  BP: 116/62  Pulse: (!) 54  SpO2: 95%  ,Body mass index is 29.04 kg/m. GEN: Well nourished, well developed in no acute distress Neck: No JVD; No carotid bruits Pulmonary: Clear to auscultation without rales, wheezing or rhonchi  Cardiovascular: Normal rate. Regular rhythm. Normal S1. Normal S2.   Murmurs: There is no murmur.  ABDOMEN: Soft, non-tender, non-distended EXTREMITIES:  No edema; No deformity   EKG/LABS/ Recent Cardiac Studies  ECG personally reviewed by me today -none completed today  Risk Assessment/Calculations:          Lab Results  Component Value Date   WBC 8.9 05/24/2023   HGB 12.9 (L) 05/24/2023   HCT 39.6 05/24/2023   MCV 86 05/24/2023   PLT 341 05/24/2023   Lab Results  Component Value Date   CREATININE 1.08 05/24/2023   BUN 15 05/24/2023   NA 139 05/24/2023   K 3.9 05/24/2023   CL 98 05/24/2023   CO2 26 05/24/2023   Lab Results  Component Value Date   CHOL 188 03/07/2023   HDL 43 03/07/2023   LDLCALC 119 (H)  03/07/2023   TRIG 147 03/07/2023   CHOLHDL 4.4 03/07/2023    Lab Results  Component Value Date   HGBA1C 5.8 (H) 01/29/2021   Assessment & Plan    1.  LE swelling/dyspnea on exertion: -Improved with no recurrence with activity and currently euvolemic on exam -Continue Lasix 20 mg daily -Low sodium diet, fluid restriction <2L, and daily weights encouraged. Educated to contact our office for weight gain of 2 lbs overnight or 5 lbs in one week.   2. Nonrheumatic MR: -Patient's last 2D echo was repeated on 05/26/2023 showing stable EF of 55 to 60% with mild to moderate MR -Patient advised to continue Lasix 20 mg daily as well as losartan 100 mg, chlorthalidone 25 mg  3.Essential hypertension: -Patient's blood pressure today was controlled today at 116/62 -Continue chlorthalidone 25 mg daily, Cardizem 180 mg daily, losartan 100 mg daily  4.History of PVCs: -Per patient under control with no symptomatic complaints -Continue Cardizem 180 mg daily  5.  Preoperative clearance: -Patient's RCRI score is 0.9% -The patient affirms he has been doing well without any new cardiac symptoms. They are able to achieve 7 METS without cardiac limitations. Therefore, based on ACC/AHA guidelines, the patient would be at acceptable risk for the planned procedure without further cardiovascular testing. The patient was advised that if he develops new symptoms prior to surgery to contact our office to arrange for a follow-up visit, and he verbalized understanding.     Disposition: Follow-up with None or APP as scheduled   Signed, Napoleon Form, Leodis Rains, NP 06/06/2023, 8:58 AM [facility]

## 2023-06-06 ENCOUNTER — Encounter: Payer: Self-pay | Admitting: Nurse Practitioner

## 2023-06-06 ENCOUNTER — Other Ambulatory Visit: Payer: Self-pay

## 2023-06-06 ENCOUNTER — Ambulatory Visit: Payer: Medicare HMO | Attending: Nurse Practitioner | Admitting: Nurse Practitioner

## 2023-06-06 VITALS — BP 116/62 | HR 54 | Ht 68.0 in | Wt 191.0 lb

## 2023-06-06 DIAGNOSIS — I10 Essential (primary) hypertension: Secondary | ICD-10-CM | POA: Diagnosis not present

## 2023-06-06 DIAGNOSIS — I493 Ventricular premature depolarization: Secondary | ICD-10-CM | POA: Diagnosis not present

## 2023-06-06 DIAGNOSIS — R0609 Other forms of dyspnea: Secondary | ICD-10-CM

## 2023-06-06 DIAGNOSIS — I340 Nonrheumatic mitral (valve) insufficiency: Secondary | ICD-10-CM | POA: Diagnosis not present

## 2023-06-06 DIAGNOSIS — Z01810 Encounter for preprocedural cardiovascular examination: Secondary | ICD-10-CM

## 2023-06-06 MED ORDER — EZETIMIBE 10 MG PO TABS: 10.0000 mg | ORAL_TABLET | Freq: Every day | ORAL | 1 refills | Status: DC

## 2023-06-06 NOTE — Progress Notes (Signed)
Anesthesia Review:  PCP: Kurtis Bushman- LOV 03/07/23  Cardiologist : Odis Hollingshead- LOV 01/25/23  Ernest Dick,NP LOV 06/05/2023  Chest x-ray : EKG : 1//25  Echo : 05/26/23  Stress   \CT Card- 10/29/22 est: Cardiac Cath :  Activity level:  Sleep Study/ CPAP : Fasting Blood Sugar :      / Checks Blood Sugar -- times a day:   Blood Thinner/ Instructions /Last Dose: ASA / Instructions/ Last Dose :    05/24/2023- cbc and BMP- normal

## 2023-06-06 NOTE — Patient Instructions (Signed)
Medication Instructions:  Your physician recommends that you continue on your current medications as directed. Please refer to the Current Medication list given to you today.  *If you need a refill on your cardiac medications before your next appointment, please call your pharmacy*   Lab Work: None ordered  If you have labs (blood work) drawn today and your tests are completely normal, you will receive your results only by: MyChart Message (if you have MyChart) OR A paper copy in the mail If you have any lab test that is abnormal or we need to change your treatment, we will call you to review the results.   Testing/Procedures: None ordered   Follow-Up: At [facility], you and your health needs are our priority.  As part of our continuing mission to provide you with exceptional heart care, we have created designated Provider Care Teams.  These Care Teams include your primary Cardiologist (physician) and Advanced Practice Providers (APPs -  Physician Assistants and Nurse Practitioners) who all work together to provide you with the care you need, when you need it.  We recommend signing up for the patient portal called "MyChart".  Sign up information is provided on this After Visit Summary.  MyChart is used to connect with patients for Virtual Visits (Telemedicine).  Patients are able to view lab/test results, encounter notes, upcoming appointments, etc.  Non-urgent messages can be sent to your provider as well.   To learn more about what you can do with MyChart, go to [email or web address].    Your next appointment:   As scheduled   Provider:   Tessa Lerner, DO     Other Instructions   1st Floor: - Lobby - Registration  - Pharmacy  - Lab - Cafe  2nd Floor: - PV Lab - Diagnostic Testing (echo, CT, nuclear med)  3rd Floor: - Vacant  4th Floor: - TCTS (cardiothoracic surgery) - AFib Clinic - Structural Heart Clinic - Vascular Surgery  - Vascular  Ultrasound  5th Floor: - HeartCare Cardiology (general and EP) - Clinical Pharmacy for coumadin, hypertension, lipid, weight-loss medications, and med management appointments    Valet parking services will be available as well.

## 2023-06-06 NOTE — Patient Instructions (Addendum)
SURGICAL WAITING ROOM VISITATION  Patients having surgery or a procedure may have no more than 2 support people in the waiting area - these visitors may rotate.    Children under the age of 62 must have an adult with them who is not the patient.  Due to an increase in RSV and influenza rates and associated hospitalizations, children ages 44 and under may not visit patients in [facility] hospitals.  Visitors with respiratory illnesses are discouraged from visiting and should remain at home.  If the patient needs to stay at the hospital during part of their recovery, the visitor guidelines for inpatient rooms apply. Pre-op nurse will coordinate an appropriate time for 1 support person to accompany patient in pre-op.  This support person may not rotate.    Please refer to the [facility] website for the visitor guidelines for Inpatients (after your surgery is over and you are in a regular room).       Your procedure is scheduled on:  06/17/23    Report to [facility] Main Entrance    Report to admitting at  0715 AM   Call this number if you have problems the morning of surgery [phone]   Do not eat food :After Midnight.   After Midnight you may have the following liquids until __ 0615____ AM DAY OF SURGERY  Water Non-Citrus Juices (without pulp, NO RED-Apple, White grape, White cranberry) Black Coffee (NO MILK/CREAM OR CREAMERS, sugar ok)  Clear Tea (NO MILK/CREAM OR CREAMERS, sugar ok) regular and decaf                             Plain Jell-O (NO RED)                                           Fruit ices (not with fruit pulp, NO RED)                                     Popsicles (NO RED)                                                               Sports drinks like Gatorade (NO RED)               The day of surgery:  Drink ONE (1) Pre-Surgery Clear Ensure or G2 at  [id number]  ( have completed by ) the morning of surgery. Drink in one sitting. Do not sip.  This  drink was given to you during your hospital  pre-op appointment visit. Nothing else to drink after completing the  Pre-Surgery Clear Ensure or G2.          If you have questions, please contact your surgeon's office.       Oral Hygiene is also important to reduce your risk of infection.                                    Remember -  BRUSH YOUR TEETH THE MORNING OF SURGERY WITH YOUR REGULAR TOOTHPASTE  DENTURES WILL BE REMOVED PRIOR TO SURGERY PLEASE DO NOT APPLY "Poly grip" OR ADHESIVES!!!   Do NOT smoke after Midnight   Stop all vitamins and herbal supplements 7 days before surgery.   Take these medicines the morning of surgery with A SIP OF WATER:   cardizem, pepcid, prtoonix, flomax   DO NOT TAKE ANY ORAL DIABETIC MEDICATIONS DAY OF YOUR SURGERY  Bring CPAP mask and tubing day of surgery.                              You may not have any metal on your body including hair pins, jewelry, and body piercing             Do not wear make-up, lotions, powders, perfumes/cologne, or deodorant  Do not wear nail polish including gel and S&S, artificial/acrylic nails, or any other type of covering on natural nails including finger and toenails. If you have artificial nails, gel coating, etc. that needs to be removed by a nail salon please have this removed prior to surgery or surgery may need to be canceled/ delayed if the surgeon/ anesthesia feels like they are unable to be safely monitored.   Do not shave  48 hours prior to surgery.               Men may shave face and neck.   Do not bring valuables to the hospital. [facility] IS NOT             RESPONSIBLE   FOR VALUABLES.   Contacts, glasses, dentures or bridgework may not be worn into surgery.   Bring small overnight bag day of surgery.   DO NOT BRING YOUR HOME MEDICATIONS TO THE HOSPITAL. PHARMACY WILL DISPENSE MEDICATIONS LISTED ON YOUR MEDICATION LIST TO YOU DURING YOUR ADMISSION IN THE HOSPITAL!    Patients discharged on  the day of surgery will not be allowed to drive home.  Someone NEEDS to stay with you for the first 24 hours after anesthesia.   Special Instructions: Bring a copy of your healthcare power of attorney and living will documents the day of surgery if you haven't scanned them before.              Please read over the following fact sheets you were given: IF YOU HAVE QUESTIONS ABOUT YOUR PRE-OP INSTRUCTIONS PLEASE CALL [phone]   If you received a COVID test during your pre-op visit  it is requested that you wear a mask when out in public, stay away from anyone that may not be feeling well and notify your surgeon if you develop symptoms. If you test positive for Covid or have been in contact with anyone that has tested positive in the last 10 days please notify you surgeon.      Pre-operative 5 CHG Bath Instructions   You can play a key role in reducing the risk of infection after surgery. Your skin needs to be as free of germs as possible. You can reduce the number of germs on your skin by washing with CHG (chlorhexidine gluconate) soap before surgery. CHG is an antiseptic soap that kills germs and continues to kill germs even after washing.   DO NOT use if you have an allergy to chlorhexidine/CHG or antibacterial soaps. If your skin becomes reddened or irritated, stop using the CHG and notify one of our  RNs at [phone].   Please shower with the CHG soap starting 4 days before surgery using the following schedule:     Please keep in mind the following:  DO NOT shave, including legs and underarms, starting the day of your first shower.   You may shave your face at any point before/day of surgery.  Place clean sheets on your bed the day you start using CHG soap. Use a clean washcloth (not used since being washed) for each shower. DO NOT sleep with pets once you start using the CHG.   CHG Shower Instructions:  If you choose to wash your hair and private area, wash first with your  normal shampoo/soap.  After you use shampoo/soap, rinse your hair and body thoroughly to remove shampoo/soap residue.  Turn the water OFF and apply about 3 tablespoons (45 ml) of CHG soap to a CLEAN washcloth.  Apply CHG soap ONLY FROM YOUR NECK DOWN TO YOUR TOES (washing for 3-5 minutes)  DO NOT use CHG soap on face, private areas, open wounds, or sores.  Pay special attention to the area where your surgery is being performed.  If you are having back surgery, having someone wash your back for you may be helpful. Wait 2 minutes after CHG soap is applied, then you may rinse off the CHG soap.  Pat dry with a clean towel  Put on clean clothes/pajamas   If you choose to wear lotion, please use ONLY the CHG-compatible lotions on the back of this paper.     Additional instructions for the day of surgery: DO NOT APPLY any lotions, deodorants, cologne, or perfumes.   Put on clean/comfortable clothes.  Brush your teeth.  Ask your nurse before applying any prescription medications to the skin.      CHG Compatible Lotions   Aveeno Moisturizing lotion  Cetaphil Moisturizing Cream  Cetaphil Moisturizing Lotion  Clairol Herbal Essence Moisturizing Lotion, Dry Skin  Clairol Herbal Essence Moisturizing Lotion, Extra Dry Skin  Clairol Herbal Essence Moisturizing Lotion, Normal Skin  Curel Age Defying Therapeutic Moisturizing Lotion with Alpha Hydroxy  Curel Extreme Care Body Lotion  Curel Soothing Hands Moisturizing Hand Lotion  Curel Therapeutic Moisturizing Cream, Fragrance-Free  Curel Therapeutic Moisturizing Lotion, Fragrance-Free  Curel Therapeutic Moisturizing Lotion, Original Formula  Eucerin Daily Replenishing Lotion  Eucerin Dry Skin Therapy Plus Alpha Hydroxy Crme  Eucerin Dry Skin Therapy Plus Alpha Hydroxy Lotion  Eucerin Original Crme  Eucerin Original Lotion  Eucerin Plus Crme Eucerin Plus Lotion  Eucerin TriLipid Replenishing Lotion  Keri Anti-Bacterial Hand Lotion  Keri  Deep Conditioning Original Lotion Dry Skin Formula Softly Scented  Keri Deep Conditioning Original Lotion, Fragrance Free Sensitive Skin Formula  Keri Lotion Fast Absorbing Fragrance Free Sensitive Skin Formula  Keri Lotion Fast Absorbing Softly Scented Dry Skin Formula  Keri Original Lotion  Keri Skin Renewal Lotion Keri Silky Smooth Lotion  Keri Silky Smooth Sensitive Skin Lotion  Nivea Body Creamy Conditioning Oil  Nivea Body Extra Enriched Lotion  Nivea Body Original Lotion  Nivea Body Sheer Moisturizing Lotion Nivea Crme  Nivea Skin Firming Lotion  NutraDerm 30 Skin Lotion  NutraDerm Skin Lotion  NutraDerm Therapeutic Skin Cream  NutraDerm Therapeutic Skin Lotion  ProShield Protective Hand Cream  Provon moisturizing lotion   [place]- Preparing for Total Shoulder Arthroplasty    Before surgery, you can play an important role. Because skin is not sterile, your skin needs to be as free of germs as possible. You can reduce the number  of germs on your skin by using the following products. Benzoyl Peroxide Gel Reduces the number of germs present on the skin Applied twice a day to shoulder area starting two days before surgery    ==================================================================  Please follow these instructions carefully:  BENZOYL PEROXIDE 5% GEL  Please do not use if you have an allergy to benzoyl peroxide.   If your skin becomes reddened/irritated stop using the benzoyl peroxide.  Starting two days before surgery, apply as follows: Apply benzoyl peroxide in the morning and at night. Apply after taking a shower. If you are not taking a shower clean entire shoulder front, back, and side along with the armpit with a clean wet washcloth.  Place a quarter-sized dollop on your shoulder and rub in thoroughly, making sure to cover the front, back, and side of your shoulder, along with the armpit.   2 days before ____ AM   ____ PM              1 day before ____ AM    ____ PM                         Do this twice a day for two days.  (Last application is the night before surgery, AFTER using the CHG soap as described below).  Do NOT apply benzoyl peroxide gel on the day of surgery.

## 2023-06-09 ENCOUNTER — Encounter (HOSPITAL_COMMUNITY)
Admission: RE | Admit: 2023-06-09 | Discharge: 2023-06-09 | Disposition: A | Discharge status: Home / Self Care | Payer: Medicare HMO | Source: Ambulatory Visit | Attending: Orthopedic Surgery | Admitting: Orthopedic Surgery

## 2023-06-09 ENCOUNTER — Other Ambulatory Visit: Payer: Self-pay

## 2023-06-09 ENCOUNTER — Encounter (HOSPITAL_COMMUNITY): Payer: Self-pay

## 2023-06-09 VITALS — BP 127/55 | HR 60 | Temp 98.0°F | Resp 16 | Ht 68.0 in | Wt 189.6 lb

## 2023-06-09 DIAGNOSIS — Z01812 Encounter for preprocedural laboratory examination: Secondary | ICD-10-CM | POA: Diagnosis present

## 2023-06-09 DIAGNOSIS — G4730 Sleep apnea, unspecified: Secondary | ICD-10-CM | POA: Diagnosis not present

## 2023-06-09 DIAGNOSIS — Z01818 Encounter for other preprocedural examination: Secondary | ICD-10-CM

## 2023-06-09 DIAGNOSIS — M12812 Other specific arthropathies, not elsewhere classified, left shoulder: Secondary | ICD-10-CM | POA: Insufficient documentation

## 2023-06-09 DIAGNOSIS — Z96611 Presence of right artificial shoulder joint: Secondary | ICD-10-CM | POA: Diagnosis not present

## 2023-06-09 DIAGNOSIS — I10 Essential (primary) hypertension: Secondary | ICD-10-CM | POA: Diagnosis not present

## 2023-06-09 DIAGNOSIS — Z87891 Personal history of nicotine dependence: Secondary | ICD-10-CM | POA: Diagnosis not present

## 2023-06-09 LAB — SURGICAL PCR SCREEN
MRSA, PCR: NEGATIVE
Staphylococcus aureus: NEGATIVE

## 2023-06-10 NOTE — Progress Notes (Signed)
Anesthesia Chart Review   Case: [id number] Date/Time: 06/17/23 0900   Procedure: REVERSE SHOULDER ARTHROPLASTY (Left: Shoulder) - interscalene block   Anesthesia type: Choice   Pre-op diagnosis: Left shoulder rotator cuff arthropathy   Location: [facility] ROOM 07 / [facility] ORS   Surgeons: Beverely Low, MD       DISCUSSION:78 y.o. former smoker with h/o PONV, HTN, sleep apnea w/cpap, left shoulder OA scheduled for above procedure 06/17/2023 with Dr. Beverely Low.   Per cardiology preoperative evaluation 06/06/2023, "-Patient's RCRI score is 0.9% -The patient affirms he has been doing well without any new cardiac symptoms. They are able to achieve 7 METS without cardiac limitations. Therefore, based on ACC/AHA guidelines, the patient would be at acceptable risk for the planned procedure without further cardiovascular testing. The patient was advised that if he develops new symptoms prior to surgery to contact our office to arrange for a follow-up visit, and he verbalized understanding. "  VS: BP (!) 127/55   Pulse 60   Temp 36.7 C (Oral)   Resp 16   Ht 5\' 8"  (1.727 m)   Wt 86 kg   SpO2 98%   BMI 28.83 kg/m   PROVIDERS: Park Meo, FNP is PCP   Tessa Lerner, DO is Cardiologist  LABS: Labs reviewed: Acceptable for surgery. (all labs ordered are listed, but only abnormal results are displayed)  Labs Reviewed  SURGICAL PCR SCREEN     IMAGES:   EKG:   CV: Myocardial Perfusion 05/30/2023   LV perfusion is normal. There is no evidence of ischemia. There is no evidence of infarction.   Left ventricular function is normal. Nuclear stress EF: 62%. The left ventricular ejection fraction is normal (55-65%). End diastolic cavity size is normal.   Averge exercise capacity (6:36 min:s; 7.2 METS). Normal HR/BP response to exercise.   The study is normal. The study is low risk.  Echo 05/26/2023  1. Left ventricular ejection fraction, by estimation, is 60 to 65%. The  left ventricle has normal  function. The left ventricle has no regional  wall motion abnormalities. Left ventricular diastolic parameters are  consistent with Grade I diastolic  dysfunction (impaired relaxation).   2. Right ventricular systolic function is normal. The right ventricular  size is normal. There is normal pulmonary artery systolic pressure. The  estimated right ventricular systolic pressure is 27.4 mmHg.   3. The mitral valve is degenerative. Trivial mitral valve regurgitation.  No evidence of mitral stenosis.   4. The aortic valve is tricuspid. Aortic valve regurgitation is trivial.  Aortic valve sclerosis/calcification is present, without any evidence of  aortic stenosis. Aortic regurgitation PHT measures 694 msec.   5. Aortic dilatation noted. There is mild dilatation of the aortic root,  measuring 39 mm.   6. The inferior vena cava is normal in size with greater than 50%  respiratory variability, suggesting right atrial pressure of 3 mmHg.  Past Medical History:  Diagnosis Date   Allergy    seasonal   Arthritis    Cataract    Diverticulosis    GERD (gastroesophageal reflux disease)    Heart murmur    History of hiatal hernia    Hyperlipidemia    Hypertension    IBS (irritable colon syndrome)    Irregular heart beat    Pneumonia    hx. of   PONV (postoperative nausea and vomiting)    with gallbladder surgery   SCC (squamous cell carcinoma) 01/02/2019   R temple-txpbx   Sleep  apnea    cpap   Vertigo     Past Surgical History:  Procedure Laterality Date   CARPAL TUNNEL RELEASE Bilateral 05/2022   CHOLECYSTECTOMY  2004   Post-cholecystectomy syndrome    COLONOSCOPY     ESOPHAGOGASTRODUODENOSCOPY     EYE SURGERY     cataract   FRACTURE SURGERY  2006   fx. wrist   JOINT REPLACEMENT     KNEE SURGERY Left 2020   LUMBAR DISC SURGERY     L4-L5 back surgery x 2   rhino plasty     deviated septum   SHOULDER SURGERY  2011   rotator cuff   TOTAL HIP ARTHROPLASTY Right 12/13/2016    Procedure: RIGHT TOTAL HIP ARTHROPLASTY ANTERIOR APPROACH;  Surgeon: Samson Frederic, MD;  Location: [facility] OR;  Service: Orthopedics;  Laterality: Right;   TOTAL SHOULDER ARTHROPLASTY Right 04/15/2017   Procedure: RIGHT TOTAL SHOULDER ARTHROPLASTY;  Surgeon: Beverely Low, MD;  Location: [facility] OR;  Service: Orthopedics;  Laterality: Right;   TYMPANOSTOMY TUBE PLACEMENT Left 2020   UPPER GASTROINTESTINAL ENDOSCOPY     VASECTOMY  1980    MEDICATIONS:  acetaminophen (TYLENOL) 500 MG tablet   baclofen (LIORESAL) 10 MG tablet   chlorthalidone (HYGROTON) 25 MG tablet   colestipol (COLESTID) 1 g tablet   diltiazem (CARDIZEM CD) 180 MG 24 hr capsule   ezetimibe (ZETIA) 10 MG tablet   famotidine (PEPCID) 40 MG tablet   fenofibrate (TRICOR) 48 MG tablet   furosemide (LASIX) 20 MG tablet   losartan (COZAAR) 100 MG tablet   pantoprazole (PROTONIX) 40 MG tablet   potassium chloride (KLOR-CON M) 10 MEQ tablet   tamsulosin (FLOMAX) 0.4 MG CAPS capsule   No current facility-administered medications for this encounter.     Jodell Cipro Ward, PA-C [facility] Pre-Surgical Testing [phone]

## 2023-06-17 ENCOUNTER — Ambulatory Visit (HOSPITAL_COMMUNITY): Payer: Medicare HMO | Admitting: Anesthesiology

## 2023-06-17 ENCOUNTER — Ambulatory Visit (HOSPITAL_COMMUNITY)
Admission: RE | Admit: 2023-06-17 | Discharge: 2023-06-17 | Disposition: A | Discharge status: Home / Self Care | Payer: Medicare HMO | Source: Ambulatory Visit | Attending: Orthopedic Surgery | Admitting: Orthopedic Surgery

## 2023-06-17 ENCOUNTER — Encounter (HOSPITAL_COMMUNITY)
Admission: RE | Disposition: A | Discharge status: Home / Self Care | Payer: Self-pay | Source: Ambulatory Visit | Attending: Orthopedic Surgery

## 2023-06-17 ENCOUNTER — Encounter (HOSPITAL_COMMUNITY): Payer: Self-pay | Admitting: Orthopedic Surgery

## 2023-06-17 ENCOUNTER — Ambulatory Visit (HOSPITAL_COMMUNITY): Payer: Medicare HMO

## 2023-06-17 ENCOUNTER — Ambulatory Visit (HOSPITAL_COMMUNITY): Payer: Medicare HMO | Admitting: Physician Assistant

## 2023-06-17 DIAGNOSIS — M75102 Unspecified rotator cuff tear or rupture of left shoulder, not specified as traumatic: Secondary | ICD-10-CM | POA: Insufficient documentation

## 2023-06-17 DIAGNOSIS — I10 Essential (primary) hypertension: Secondary | ICD-10-CM | POA: Insufficient documentation

## 2023-06-17 DIAGNOSIS — G4730 Sleep apnea, unspecified: Secondary | ICD-10-CM | POA: Diagnosis not present

## 2023-06-17 DIAGNOSIS — Z87891 Personal history of nicotine dependence: Secondary | ICD-10-CM | POA: Diagnosis not present

## 2023-06-17 DIAGNOSIS — K449 Diaphragmatic hernia without obstruction or gangrene: Secondary | ICD-10-CM | POA: Insufficient documentation

## 2023-06-17 DIAGNOSIS — Z01818 Encounter for other preprocedural examination: Secondary | ICD-10-CM

## 2023-06-17 DIAGNOSIS — M129 Arthropathy, unspecified: Secondary | ICD-10-CM | POA: Diagnosis present

## 2023-06-17 DIAGNOSIS — K219 Gastro-esophageal reflux disease without esophagitis: Secondary | ICD-10-CM | POA: Insufficient documentation

## 2023-06-17 DIAGNOSIS — Z79899 Other long term (current) drug therapy: Secondary | ICD-10-CM | POA: Insufficient documentation

## 2023-06-17 HISTORY — PX: REVERSE SHOULDER ARTHROPLASTY: SHX5054

## 2023-06-17 SURGERY — ARTHROPLASTY, SHOULDER, TOTAL, REVERSE
Anesthesia: General | Site: Shoulder | Laterality: Left

## 2023-06-17 MED ORDER — DEXAMETHASONE SODIUM PHOSPHATE 10 MG/ML IJ SOLN
INTRAMUSCULAR | Status: DC | PRN
  Administered 2023-06-17: 5 mg via INTRAVENOUS

## 2023-06-17 MED ORDER — FENTANYL CITRATE (PF) 100 MCG/2ML IJ SOLN
INTRAMUSCULAR | Status: AC
  Filled 2023-06-17: qty 2

## 2023-06-17 MED ORDER — MIDAZOLAM HCL 2 MG/2ML IJ SOLN
1.0000 mg | Freq: Once | INTRAMUSCULAR | Status: AC
  Administered 2023-06-17: 0.5 mg via INTRAVENOUS
  Filled 2023-06-17: qty 2

## 2023-06-17 MED ORDER — ONDANSETRON HCL 4 MG PO TABS: 4.0000 mg | ORAL_TABLET | Freq: Three times a day (TID) | ORAL | 1 refills | Status: DC | PRN

## 2023-06-17 MED ORDER — METHOCARBAMOL 500 MG PO TABS: 500.0000 mg | ORAL_TABLET | Freq: Four times a day (QID) | ORAL | Status: DC | PRN

## 2023-06-17 MED ORDER — BUPIVACAINE HCL (PF) 0.5 % IJ SOLN
INTRAMUSCULAR | Status: DC | PRN
  Administered 2023-06-17: 15 mL via PERINEURAL

## 2023-06-17 MED ORDER — LIDOCAINE HCL (PF) 2 % IJ SOLN
INTRAMUSCULAR | Status: AC
  Filled 2023-06-17: qty 5

## 2023-06-17 MED ORDER — METHOCARBAMOL 500 MG PO TABS: 500.0000 mg | ORAL_TABLET | Freq: Four times a day (QID) | ORAL | 1 refills | Status: DC | PRN

## 2023-06-17 MED ORDER — AMISULPRIDE (ANTIEMETIC) 5 MG/2ML IV SOLN
INTRAVENOUS | Status: AC
  Filled 2023-06-17: qty 4

## 2023-06-17 MED ORDER — OXYCODONE-ACETAMINOPHEN 5-325 MG PO TABS: 1.0000 | ORAL_TABLET | ORAL | 0 refills | Status: DC | PRN

## 2023-06-17 MED ORDER — ACETAMINOPHEN 500 MG PO TABS
1000.0000 mg | ORAL_TABLET | Freq: Once | ORAL | Status: AC
  Administered 2023-06-17: 1000 mg via ORAL
  Filled 2023-06-17: qty 2

## 2023-06-17 MED ORDER — ORAL CARE MOUTH RINSE: 15.0000 mL | Freq: Once | OROMUCOSAL | Status: AC

## 2023-06-17 MED ORDER — DEXAMETHASONE SODIUM PHOSPHATE 10 MG/ML IJ SOLN
INTRAMUSCULAR | Status: AC
  Filled 2023-06-17: qty 1

## 2023-06-17 MED ORDER — ONDANSETRON HCL 4 MG/2ML IJ SOLN
INTRAMUSCULAR | Status: DC | PRN
  Administered 2023-06-17: 4 mg via INTRAVENOUS

## 2023-06-17 MED ORDER — ONDANSETRON HCL 4 MG/2ML IJ SOLN
INTRAMUSCULAR | Status: AC
  Filled 2023-06-17: qty 2

## 2023-06-17 MED ORDER — EPHEDRINE 5 MG/ML INJ
INTRAVENOUS | Status: AC
  Filled 2023-06-17: qty 5

## 2023-06-17 MED ORDER — PROPOFOL 10 MG/ML IV BOLUS
INTRAVENOUS | Status: DC | PRN
  Administered 2023-06-17: 30 mg via INTRAVENOUS
  Administered 2023-06-17: 170 mg via INTRAVENOUS

## 2023-06-17 MED ORDER — SUCCINYLCHOLINE CHLORIDE 200 MG/10ML IV SOSY
PREFILLED_SYRINGE | INTRAVENOUS | Status: AC
  Filled 2023-06-17: qty 10

## 2023-06-17 MED ORDER — FENTANYL CITRATE (PF) 250 MCG/5ML IJ SOLN
INTRAMUSCULAR | Status: DC | PRN
  Administered 2023-06-17 (×2): 50 ug via INTRAVENOUS

## 2023-06-17 MED ORDER — BUPIVACAINE LIPOSOME 1.3 % IJ SUSP
INTRAMUSCULAR | Status: DC | PRN
  Administered 2023-06-17: 10 mL via PERINEURAL

## 2023-06-17 MED ORDER — FENTANYL CITRATE PF 50 MCG/ML IJ SOSY
PREFILLED_SYRINGE | INTRAMUSCULAR | Status: AC
  Filled 2023-06-17: qty 2

## 2023-06-17 MED ORDER — PHENYLEPHRINE HCL-NACL 20-0.9 MG/250ML-% IV SOLN
INTRAVENOUS | Status: DC | PRN
  Administered 2023-06-17: 50 ug/min via INTRAVENOUS

## 2023-06-17 MED ORDER — EPHEDRINE SULFATE-NACL 50-0.9 MG/10ML-% IV SOSY
PREFILLED_SYRINGE | INTRAVENOUS | Status: DC | PRN
  Administered 2023-06-17: 10 mg via INTRAVENOUS

## 2023-06-17 MED ORDER — TRANEXAMIC ACID-NACL 1000-0.7 MG/100ML-% IV SOLN
1000.0000 mg | Freq: Once | INTRAVENOUS | Status: DC
Start: 2023-06-17 — End: 2023-06-17

## 2023-06-17 MED ORDER — CEFAZOLIN SODIUM-DEXTROSE 2-4 GM/100ML-% IV SOLN
2.0000 g | INTRAVENOUS | Status: AC
  Administered 2023-06-17: 2 g via INTRAVENOUS
  Filled 2023-06-17: qty 100

## 2023-06-17 MED ORDER — CHLORHEXIDINE GLUCONATE 0.12 % MT SOLN
15.0000 mL | Freq: Once | OROMUCOSAL | Status: AC
  Administered 2023-06-17: 15 mL via OROMUCOSAL

## 2023-06-17 MED ORDER — METHOCARBAMOL 1000 MG/10ML IJ SOLN: 500.0000 mg | Freq: Four times a day (QID) | INTRAMUSCULAR | Status: DC | PRN

## 2023-06-17 MED ORDER — FENTANYL CITRATE PF 50 MCG/ML IJ SOSY
25.0000 ug | PREFILLED_SYRINGE | INTRAMUSCULAR | Status: DC | PRN
  Administered 2023-06-17: 25 ug via INTRAVENOUS

## 2023-06-17 MED ORDER — OXYCODONE HCL 5 MG PO TABS: 5.0000 mg | ORAL_TABLET | Freq: Once | ORAL | Status: AC

## 2023-06-17 MED ORDER — PROPOFOL 10 MG/ML IV BOLUS
INTRAVENOUS | Status: AC
  Filled 2023-06-17: qty 20

## 2023-06-17 MED ORDER — OXYCODONE HCL 5 MG PO TABS
ORAL_TABLET | ORAL | Status: AC
  Administered 2023-06-17: 5 mg via ORAL
  Filled 2023-06-17: qty 1

## 2023-06-17 MED ORDER — 0.9 % SODIUM CHLORIDE (POUR BTL) OPTIME
TOPICAL | Status: DC | PRN
  Administered 2023-06-17: 1000 mL

## 2023-06-17 MED ORDER — SUCCINYLCHOLINE CHLORIDE 200 MG/10ML IV SOSY
PREFILLED_SYRINGE | INTRAVENOUS | Status: DC | PRN
  Administered 2023-06-17: 120 mg via INTRAVENOUS

## 2023-06-17 MED ORDER — AMISULPRIDE (ANTIEMETIC) 5 MG/2ML IV SOLN
10.0000 mg | Freq: Once | INTRAVENOUS | Status: AC | PRN
  Administered 2023-06-17: 10 mg via INTRAVENOUS

## 2023-06-17 MED ORDER — LACTATED RINGERS IV SOLN: INTRAVENOUS | Status: DC

## 2023-06-17 MED ORDER — LIDOCAINE 2% (20 MG/ML) 5 ML SYRINGE
INTRAMUSCULAR | Status: DC | PRN
  Administered 2023-06-17: 20 mg via INTRAVENOUS

## 2023-06-17 MED ORDER — GLYCOPYRROLATE 0.2 MG/ML IJ SOLN
INTRAMUSCULAR | Status: DC | PRN
  Administered 2023-06-17: .2 mg via INTRAVENOUS

## 2023-06-17 MED ORDER — FENTANYL CITRATE PF 50 MCG/ML IJ SOSY
50.0000 ug | PREFILLED_SYRINGE | Freq: Once | INTRAMUSCULAR | Status: AC
  Administered 2023-06-17: 50 ug via INTRAVENOUS
  Filled 2023-06-17: qty 2

## 2023-06-17 MED ORDER — TRANEXAMIC ACID-NACL 1000-0.7 MG/100ML-% IV SOLN
1000.0000 mg | INTRAVENOUS | Status: AC
  Administered 2023-06-17: 1000 mg via INTRAVENOUS
  Filled 2023-06-17: qty 100

## 2023-06-17 MED ORDER — BUPIVACAINE-EPINEPHRINE (PF) 0.25% -1:200000 IJ SOLN
INTRAMUSCULAR | Status: DC | PRN
  Administered 2023-06-17: 8 mL

## 2023-06-17 SURGICAL SUPPLY — 64 items
BAG COUNTER SPONGE SURGICOUNT (BAG) IMPLANT
BAG ZIPLOCK 12X15 (MISCELLANEOUS) IMPLANT
BIT DRILL 1.6MX128 (BIT) IMPLANT
BIT DRILL 170X2.5X (BIT) IMPLANT
BIT DRL 170X2.5X (BIT) ×1
BLADE SAG 18X100X1.27 (BLADE) ×2 IMPLANT
COVER BACK TABLE 60X90IN (DRAPES) ×2 IMPLANT
COVER SURGICAL LIGHT HANDLE (MISCELLANEOUS) ×2 IMPLANT
CUP HUMERAL 42 PLUS 3 (Orthopedic Implant) IMPLANT
DRAPE INCISE IOBAN 66X45 STRL (DRAPES) ×2 IMPLANT
DRAPE SHEET LG 3/4 BI-LAMINATE (DRAPES) ×2 IMPLANT
DRAPE SURG ORHT 6 SPLT 77X108 (DRAPES) ×4 IMPLANT
DRAPE TOP 10253 STERILE (DRAPES) ×2 IMPLANT
DRAPE U-SHAPE 47X51 STRL (DRAPES) ×2 IMPLANT
DRSG ADAPTIC 3X8 NADH LF (GAUZE/BANDAGES/DRESSINGS) ×2 IMPLANT
DRSG AQUACEL AG ADV 3.5X10 (GAUZE/BANDAGES/DRESSINGS) IMPLANT
DURAPREP 26ML APPLICATOR (WOUND CARE) ×2 IMPLANT
ELECT BLADE TIP CTD 4 INCH (ELECTRODE) ×2 IMPLANT
ELECT NDL TIP 2.8 STRL (NEEDLE) ×2 IMPLANT
ELECT NEEDLE TIP 2.8 STRL (NEEDLE) ×1
ELECT REM PT RETURN 15FT ADLT (MISCELLANEOUS) ×2 IMPLANT
EPIPHSYSI CENTER SZ 2 LT (Shoulder) ×1 IMPLANT
EPIPHYSIS CENTER SZ 2 LT (Shoulder) IMPLANT
FACESHIELD WRAPAROUND (MASK) ×1
FACESHIELD WRAPAROUND OR TEAM (MASK) ×2 IMPLANT
GAUZE PAD ABD 8X10 STRL (GAUZE/BANDAGES/DRESSINGS) ×2 IMPLANT
GAUZE SPONGE 4X4 12PLY STRL (GAUZE/BANDAGES/DRESSINGS) ×2 IMPLANT
GLENOSPHERE XTEND LAT 42+0 STD (Miscellaneous) IMPLANT
GLOVE BIOGEL PI IND STRL 7.5 (GLOVE) ×2 IMPLANT
GLOVE BIOGEL PI IND STRL 8.5 (GLOVE) ×2 IMPLANT
GLOVE ORTHO TXT STRL SZ7.5 (GLOVE) ×2 IMPLANT
GLOVE SURG ORTHO 8.5 STRL (GLOVE) ×2 IMPLANT
GOWN STRL REUS W/ TWL XL LVL3 (GOWN DISPOSABLE) ×4 IMPLANT
KIT BASIN OR (CUSTOM PROCEDURE TRAY) ×2 IMPLANT
KIT TURNOVER KIT A (KITS) IMPLANT
MANIFOLD NEPTUNE II (INSTRUMENTS) ×2 IMPLANT
METAGLENE DELTA EXTEND (Trauma) IMPLANT
METAGLENE DXTEND (Trauma) ×1 IMPLANT
NDL MAYO CATGUT SZ4 TPR NDL (NEEDLE) IMPLANT
NEEDLE MAYO CATGUT SZ4 (NEEDLE) ×1
NS IRRIG 1000ML POUR BTL (IV SOLUTION) ×2 IMPLANT
PACK SHOULDER (CUSTOM PROCEDURE TRAY) ×2 IMPLANT
PIN GUIDE 1.2 (PIN) IMPLANT
PIN GUIDE GLENOPHERE 1.5MX300M (PIN) IMPLANT
PIN METAGLENE 2.5 (PIN) IMPLANT
RESTRAINT HEAD UNIVERSAL NS (MISCELLANEOUS) ×2 IMPLANT
SCREW 48L (Screw) IMPLANT
SCREW BN 24X4.5XLCK STRL (Screw) IMPLANT
SCREW LOCK 42 (Screw) IMPLANT
SLING ARM FOAM STRAP LRG (SOFTGOODS) IMPLANT
SPIKE FLUID TRANSFER (MISCELLANEOUS) ×2 IMPLANT
SPONGE T-LAP 4X18 ~~LOC~~+RFID (SPONGE) IMPLANT
STEM DELTA DIA 10 HA (Stem) IMPLANT
STRIP CLOSURE SKIN 1/2X4 (GAUZE/BANDAGES/DRESSINGS) ×2 IMPLANT
SUT FIBERWIRE #2 38 T-5 BLUE (SUTURE) ×4
SUT MNCRL AB 4-0 PS2 18 (SUTURE) ×2 IMPLANT
SUT VIC AB 0 CT1 36 (SUTURE) ×2 IMPLANT
SUT VIC AB 0 CT2 27 (SUTURE) ×2 IMPLANT
SUT VIC AB 2-0 CT1 TAPERPNT 27 (SUTURE) ×2 IMPLANT
SUTURE FIBERWR #2 38 T-5 BLUE (SUTURE) ×2 IMPLANT
TAPE CLOTH SURG 4X10 WHT LF (GAUZE/BANDAGES/DRESSINGS) IMPLANT
TOWEL GREEN STERILE FF (TOWEL DISPOSABLE) ×2 IMPLANT
TOWEL OR 17X26 10 PK STRL BLUE (TOWEL DISPOSABLE) ×2 IMPLANT
WATER STERILE IRR 1000ML POUR (IV SOLUTION) IMPLANT

## 2023-06-17 NOTE — Anesthesia Postprocedure Evaluation (Signed)
Anesthesia Post Note  [name]  Procedure(s) Performed: REVERSE SHOULDER ARTHROPLASTY (Left: Shoulder)     Patient location during evaluation: PACU Anesthesia Type: General Level of consciousness: awake and alert Pain management: pain level controlled Vital Signs Assessment: post-procedure vital signs reviewed and stable Respiratory status: spontaneous breathing, nonlabored ventilation, respiratory function stable and patient connected to nasal cannula oxygen Cardiovascular status: blood pressure returned to baseline and stable Postop Assessment: no apparent nausea or vomiting Anesthetic complications: no  No notable events documented.  Last Vitals:  Vitals:   06/17/23 1200 06/17/23 1230  BP: (!) 106/51 116/62  Pulse: 61 67  Resp: 16 18  Temp:  36.8 C  SpO2: 95% 93%    Last Pain:  Vitals:   06/17/23 1230  TempSrc:   PainSc: 2                  Kennieth Rad

## 2023-06-17 NOTE — Transfer of Care (Signed)
Immediate Anesthesia Transfer of Care Note  Patient: HERMILO DUTTER  Procedure(s) Performed: REVERSE SHOULDER ARTHROPLASTY (Left: Shoulder)  Patient Location: PACU  Anesthesia Type:GA combined with regional for post-op pain  Level of Consciousness: awake, alert , oriented, and patient cooperative  Airway & Oxygen Therapy: Patient Spontanous Breathing and Patient connected to face mask oxygen  Post-op Assessment: Report given to RN and Post -op Vital signs reviewed and stable  Post vital signs: Reviewed and stable  Last Vitals:  Vitals Value Taken Time  BP 122/63 06/17/23 1109  Temp    Pulse 70 06/17/23 1110  Resp 14 06/17/23 1110  SpO2 100 % 06/17/23 1110  Vitals shown include unfiled device data.  Last Pain:  Vitals:   06/17/23 0850  TempSrc:   PainSc: 0-No pain         Complications: No notable events documented.

## 2023-06-17 NOTE — Anesthesia Preprocedure Evaluation (Signed)
Anesthesia Evaluation  Patient identified by MRN, date of birth, ID band Patient awake    Reviewed: Allergy & Precautions, NPO status , Patient's Chart, lab work & pertinent test results  History of Anesthesia Complications (+) PONV and history of anesthetic complications  Airway Mallampati: II  TM Distance: >3 FB Neck ROM: Full    Dental  (+) Dental Advisory Given   Pulmonary sleep apnea and Continuous Positive Airway Pressure Ventilation , former smoker   breath sounds clear to auscultation       Cardiovascular hypertension, Pt. on medications  Rhythm:Regular Rate:Normal     Neuro/Psych  Neuromuscular disease    GI/Hepatic Neg liver ROS, hiatal hernia,GERD  ,,  Endo/Other  negative endocrine ROS    Renal/GU negative Renal ROS     Musculoskeletal  (+) Arthritis ,    Abdominal   Peds  Hematology negative hematology ROS (+)   Anesthesia Other Findings   Reproductive/Obstetrics                             Anesthesia Physical Anesthesia Plan  ASA: 2  Anesthesia Plan: General   Post-op Pain Management: Regional block* and Tylenol PO (pre-op)*   Induction: Intravenous  PONV Risk Score and Plan: 3 and Dexamethasone, Ondansetron and Treatment may vary due to age or medical condition  Airway Management Planned: Oral ETT  Additional Equipment: None  Intra-op Plan:   Post-operative Plan: Extubation in OR  Informed Consent: I have reviewed the patients History and Physical, chart, labs and discussed the procedure including the risks, benefits and alternatives for the proposed anesthesia with the patient or authorized representative who has indicated his/her understanding and acceptance.     Dental advisory given  Plan Discussed with: CRNA  Anesthesia Plan Comments:        Anesthesia Quick Evaluation

## 2023-06-17 NOTE — Evaluation (Signed)
Occupational Therapy Evaluation and Discharge Patient Details Name: Brett [name] MRN: [id number] DOB: [birth year] Today's Date: 06/17/2023   History of Present Illness Pt is a 78 yo left reverse total shoulder arthroplasty with subscapularis repair.   Clinical Impression   PTA pt lives at home with wife.  Education completed regarding compensatory strategies for ADL tasks and functional mobility, management of sling, ok for gentle hand to face ADLs, protect subscap repair and elbow, wrist, hand ROM per specified parameters in the order set as indicated below, positioning of operative arm in sitting and supine and edema control. Caregiver present for education, written handouts provided and reviewed using Teach Back and pt/caregiver verbalized/demonstrated understanding. Due to the below listed deficits, pt requires mod-max assistance with ADL tasks and CGA assist with functional mobility. Caregiver will be able to provide necessary level of assistance at discharge. Pt to follow up with MD to progress rehab of the operative shoulder.        If plan is discharge home, recommend the following: A lot of help with bathing/dressing/bathroom;Assistance with cooking/housework;Assist for transportation    Functional Status Assessment  Patient has had a recent decline in their functional status and demonstrates the ability to make significant improvements in function in a reasonable and predictable amount of time. (No further skilled acute OT)  Equipment Recommendations  None recommended by OT       Precautions / Restrictions Precautions Precautions: Shoulder Shoulder Interventions: Shoulder sling/immobilizer Precaution Booklet Issued: Yes (comment) Required Braces or Orthoses: Sling Restrictions Weight Bearing Restrictions Per Provider Order: Yes LUE Weight Bearing Per Provider Order: Non weight bearing   Sling at all times except ADL/exercise Yes   Non weight bearing Yes   AROM elbow, wrist  and hand to tolerance Yes   PROM of shoulder No   AROM of shoulder No   OT Consult Special Instructions ok for gentle hand to face ADLs, protect subscap repair             Vision Baseline Vision/History: 1 Wears glasses Ability to See in Adequate Light: 0 Adequate Patient Visual Report: No change from baseline              Pertinent Vitals/Pain Pain Assessment Pain Assessment: No/denies pain     Extremity/Trunk Assessment Upper Extremity Assessment Upper Extremity Assessment: Right hand dominant;LUE deficits/detail LUE Deficits / Details: shoulder sx this admission, block has not worn off LUE Coordination: decreased fine motor;decreased gross motor           Communication Communication Communication: No apparent difficulties   Cognition Arousal: Alert Behavior During Therapy: WFL for tasks assessed/performed Overall Cognitive Status: Within Functional Limits for tasks assessed                                                  Home Living Family/patient expects to be discharged to:: Private residence Living Arrangements: Spouse/significant other Available Help at Discharge: Family;Available 24 hours/day Type of Home: House Home Access: Stairs to enter [employer] of Steps: 3   Home Layout: One level     Bathroom Shower/Tub: Producer, television/film/video: Handicapped height     Home Equipment: Shower seat   Additional Comments: reports home is handicap accessible. All information from previous stay 3 months ago      Prior Functioning/Environment Prior Level of Function : Independent/Modified  Independent                        OT Problem List: Decreased strength;Decreased range of motion;Impaired UE functional use         OT Goals(Current goals can be found in the care plan section) Acute Rehab OT Goals Patient Stated Goal: Home today         AM-PAC OT "6 Clicks" Daily Activity     Outcome Measure Help  from another person eating meals?: A Little Help from another person taking care of personal grooming?: A Little Help from another person toileting, which includes using toliet, bedpan, or urinal?: A Lot Help from another person bathing (including washing, rinsing, drying)?: A Lot Help from another person to put on and taking off regular upper body clothing?: A Lot Help from another person to put on and taking off regular lower body clothing?: A Lot 6 Click Score: 14   End of Session Nurse Communication:  (pt with emesis and O2 sats have ranged from 89-93% while I was with him)  Activity Tolerance: Patient tolerated treatment well Patient left: in chair;with call bell/phone within reach;with family/visitor present  OT Visit Diagnosis: Muscle weakness (generalized) (M62.81)                Time: 1610-9604 OT Time Calculation (min): 21 min Charges:  OT General Charges $OT Visit: 1 Visit  Lindon Romp OT Acute Rehabilitation Services Office [phone]    Evette Georges 06/17/2023, 1:22 PM

## 2023-06-17 NOTE — Interval H&P Note (Signed)
History and Physical Interval Note:  06/17/2023 7:40 AM  Brett [name]  has presented today for surgery, with the diagnosis of Left shoulder rotator cuff arthropathy.  The various methods of treatment have been discussed with the patient and family. After consideration of risks, benefits and other options for treatment, the patient has consented to  Procedure(s) with comments: REVERSE SHOULDER ARTHROPLASTY (Left) - interscalene block as a surgical intervention.  The patient's history has been reviewed, patient examined, no change in status, stable for surgery.  I have reviewed the patient's chart and labs.  Questions were answered to the patient's satisfaction.     Verlee [name]

## 2023-06-17 NOTE — Op Note (Signed)
NAME: Brett [name], Brett [name]. MEDICAL RECORD NO: [id number] ACCOUNT NO: [id number] DATE OF BIRTH: Jun 25, 1945 FACILITY: [facility] LOCATION: [facility] PHYSICIAN: Almedia Balls. Ranell Patrick, MD  Operative Report   DATE OF PROCEDURE: 06/17/2023  PREOPERATIVE DIAGNOSIS:  Left shoulder rotator cuff tear arthropathy.  POSTOPERATIVE DIAGNOSIS:  Left shoulder rotator cuff tear arthropathy.  PROCEDURE PERFORMED:  Left reverse total shoulder arthroplasty using DePuy Delta Xtend prosthesis with subscapularis repair.  ATTENDING SURGEON:  Almedia Balls. Ranell Patrick, MD  ASSISTANT: Konrad Felix Dixon, [place], who was scrubbed during the entire procedure, and necessary for satisfactory completion of surgery.  ANESTHESIA:  General anesthesia was used plus interscalene block.  ESTIMATED BLOOD LOSS: 150 mL.  FLUID REPLACEMENT:  1000 mL crystalloid.  COUNTS:  Instrument count was correct.  COMPLICATIONS:  No complications.  ANTIBIOTICS:  Perioperative antibiotics were given.  INDICATIONS:  The patient is a 78 year old male who presents with worsening left shoulder pain secondary to rotator cuff tear arthropathy and rotator cuff dysfunction.  The patient has had an extended period of conservative management and has failed that  and now presents for operative shoulder arthroplasty to eliminate pain and to restore function.  Informed consent obtained.  DESCRIPTION OF PROCEDURE:  After an adequate level of anesthesia was achieved, the patient was positioned in the modified beach chair position.  The left shoulder was correctly identified and sterile prep and drape were performed.  Timeout called  verifying correct patient and correct site.  We entered the patient's shoulder using a standard deltopectoral incision starting at the coracoid process extending down to the anterior humerus.  Dissection down through subcutaneous tissues using Bovie,  cephalic vein was identified and taken laterally at the deltoid.  Pectoral was taken medially.   The conjoined tendon was identified and retracted medially.  Deep retractors were placed.  We identified the biceps tendon and tenodesed that in situ with 0  Vicryl figure-of-eight suture x2 incorporating part of the pectoralis tendon.  We then went and released the subscapularis subperiosteally off the lesser tuberosity and tagged for repair at the end with #2 FiberWire suture in a modified Mason-Allen  suture technique.  We then released the inferior capsule progressively externally rotating and extending the shoulder and delivering the humeral head out of the wound.  We divided the biceps and the remaining supraspinatus, which had partial-thickness  tearing throughout.  We took it back to about the posterior infraspinatus with a release.  That exposed the proximal humerus, which was devoid of cartilage.  We entered the proximal humerus with a 6-mm reamer, reaming up to a size 10.  We then placed our  10-mm T-handle guide and resected the head at 20 degrees of retroversion with the oscillating saw.  We then removed excess osteophytes with a rongeur.  We then subluxed the humerus posteriorly, gaining good exposure of the glenoid.  We removed the  biceps stump and the labrum.  We removed the remaining cartilage, which was not much on the glenoid and then placed our deep retractors.  We placed our guide pin centered low on the glenoid.  We reamed to the subchondral bone for the metaglene baseplate.   Next, we did our peripheral hand reaming and then drilled out our central peg hole.  We irrigated thoroughly.  We then impacted the HA-coated press-fit baseplate into position.  We placed a 48 screw inferiorly, a 42 screw superiorly, and a 24 screw  anteriorly locking all three of those screws.  Once the screws were fully seated and secured, the  baseplate was secured.  We selected a real 42+0 standard glenosphere and attached that to the baseplate with a screwdriver.  Once that was attached, I did a  finger sweep  to make sure we had no soft tissue caught up in the bearing between the baseplate and the glenosphere.  We then went to the humeral side and reamed for the two left metaphysis.  We trialed with a 10 stem and the two left metaphysis set in  the 0 setting and placed in 20 degrees of retroversion.  Once we had that in place and secured, we selected the trial 42+3 polyethylene placed that on the humeral tray and reduced the shoulder.  We had excellent soft tissue balancing and stability.  We  checked to make sure the subscapularis was repairable, which it was.  I was not going to restrict external rotation excessively.  We removed all the trial components from the humeral side.  We irrigated thoroughly.  I drilled holes in the lesser  tuberosity and placed #2 FiberWire suture for repair of the subscapularis.  We then went ahead and used available bone graft from the humeral head and with impaction grafting technique we impacted the HA-coated Press-Fit 10 stem in the two left  metaphysis set on the 0 setting and impacted in 20 degrees of retroversion.  With the stem secured, we selected the real 42+3 poly.  We placed that on the humeral tray, impacted that, and then reduced the shoulder.  A nice little popliteal as it reduced.  Conjoined appropriately tensioned.  No gapping with inferior pole or external rotation.  Excellent range of motion.  No impingement.  We irrigated again and then repaired the subscapularis anatomically back to bone with #2 FiberWire suture.  We then  went ahead and did our irrigation and then 0 Vicryl deltopectoral closure followed by 2-0 Vicryl for subcutaneous closure and 4-0 Monocryl for skin.  Steri-Strips were applied followed by a sterile dressing.  The patient tolerated the surgery well.   PUS D: 06/17/2023 11:11:37 am T: 06/17/2023 12:22:00 pm  JOB: [id number]/ [id number]

## 2023-06-17 NOTE — Anesthesia Procedure Notes (Signed)
Procedure Name: Intubation Date/Time: 06/17/2023 9:20 AM  Performed by: Elyn Peers, CRNAPre-anesthesia Checklist: Patient identified, Emergency Drugs available, Suction available, Patient being monitored and Timeout performed Patient Re-evaluated:Patient Re-evaluated prior to induction Oxygen Delivery Method: Circle system utilized Preoxygenation: Pre-oxygenation with 100% oxygen Induction Type: IV induction Ventilation: Mask ventilation without difficulty and Oral airway inserted - appropriate to patient size Laryngoscope Size: Miller and 3 Grade View: Grade II Tube type: Oral Tube size: 7.5 mm Number of attempts: 1 Airway Equipment and Method: Stylet Placement Confirmation: ETT inserted through vocal cords under direct vision, positive ETCO2 and breath sounds checked- equal and bilateral Secured at: 23 cm Tube secured with: Tape Dental Injury: Teeth and Oropharynx as per pre-operative assessment

## 2023-06-17 NOTE — Brief Op Note (Signed)
06/17/2023  11:06 AM  PATIENT:  Brett [name]  78 y.o. male  PRE-OPERATIVE DIAGNOSIS:  Left shoulder rotator cuff arthropathy  POST-OPERATIVE DIAGNOSIS:  Left shoulder rotator cuff arthropathy  PROCEDURE:  Procedure(s) with comments: REVERSE SHOULDER ARTHROPLASTY (Left) - interscalene block DePuy Delta Xtend WITH subscap repair  SURGEON:  Surgeons and Role:    Beverely Low, MD - Primary  PHYSICIAN ASSISTANT:   ASSISTANTS: Thea Gist, PA-C   ANESTHESIA:   regional and general  EBL:  150 mL   BLOOD ADMINISTERED:none  DRAINS: none   LOCAL MEDICATIONS USED:  MARCAINE     SPECIMEN:  No Specimen  DISPOSITION OF SPECIMEN:  N/A  COUNTS:  YES  TOURNIQUET:  * No tourniquets in log *  DICTATION: .Other Dictation: Dictation Number [id number]  PLAN OF CARE: Discharge to home after PACU  PATIENT DISPOSITION:  PACU - hemodynamically stable.   Delay start of Pharmacological VTE agent (>24hrs) due to surgical blood loss or risk of bleeding: not applicable

## 2023-06-17 NOTE — Anesthesia Procedure Notes (Signed)
Anesthesia Regional Block: Interscalene brachial plexus block   Pre-Anesthetic Checklist: , timeout performed,  Correct Patient, Correct Site, Correct Laterality,  Correct Procedure, Correct Position, site marked,  Risks and benefits discussed,  Surgical consent,  Pre-op evaluation,  At surgeon's request and post-op pain management  Laterality: Left  Prep: chloraprep       Needles:  Injection technique: Single-shot  Needle Type: Echogenic Needle     Needle Length: 9cm  Needle Gauge: 21     Additional Needles:   Procedures:,,,, ultrasound used (permanent image in chart),,    Narrative:  Start time: 06/17/2023 8:37 AM End time: 06/17/2023 8:44 AM Injection made incrementally with aspirations every 5 mL.  Performed by: Personally  Anesthesiologist: Marcene Duos, MD

## 2023-06-17 NOTE — Discharge Instructions (Signed)
Ice to the shoulder constantly.  Keep the incision covered and clean and dry for one week, then ok to get it wet in the shower. Change to the Aquacel on Sunday and leave on until next Friday then remove and leave incision open to air.   Do exercise as instructed several times per day.  DO NOT reach behind your back or push up out of a chair with the operative arm.  Use a sling while you are up and around for comfort, may remove while seated.  Keep pillow propped behind the operative elbow.  Follow up with Dr Ranell Patrick in two weeks in the office, call [phone] for appt  Please call Dr Ranell Patrick (cell) [phone] with any questions or concerns

## 2023-06-20 ENCOUNTER — Encounter (HOSPITAL_COMMUNITY): Payer: Self-pay | Admitting: Orthopedic Surgery

## 2023-07-04 ENCOUNTER — Encounter: Payer: Self-pay | Admitting: Gastroenterology

## 2023-07-11 ENCOUNTER — Encounter: Payer: Self-pay | Admitting: Cardiology

## 2023-07-11 NOTE — Telephone Encounter (Signed)
 Sure, decrease chlorthalidone to 12.5 mg p.o. every morning.  Soila Printup [place], DO, [facility]

## 2023-07-12 ENCOUNTER — Other Ambulatory Visit: Payer: Self-pay

## 2023-07-12 DIAGNOSIS — I10 Essential (primary) hypertension: Secondary | ICD-10-CM

## 2023-07-12 MED ORDER — CHLORTHALIDONE 25 MG PO TABS: 12.5000 mg | ORAL_TABLET | Freq: Every morning | ORAL | Status: DC

## 2023-07-25 ENCOUNTER — Ambulatory Visit: Payer: Medicare HMO | Attending: Cardiology | Admitting: Cardiology

## 2023-07-25 ENCOUNTER — Encounter: Payer: Self-pay | Admitting: Cardiology

## 2023-07-25 VITALS — BP 122/66 | HR 59 | Resp 16 | Ht 68.0 in | Wt 192.8 lb

## 2023-07-25 DIAGNOSIS — I2584 Coronary atherosclerosis due to calcified coronary lesion: Secondary | ICD-10-CM

## 2023-07-25 DIAGNOSIS — I493 Ventricular premature depolarization: Secondary | ICD-10-CM | POA: Diagnosis not present

## 2023-07-25 DIAGNOSIS — G4733 Obstructive sleep apnea (adult) (pediatric): Secondary | ICD-10-CM

## 2023-07-25 DIAGNOSIS — I700 Atherosclerosis of aorta: Secondary | ICD-10-CM

## 2023-07-25 DIAGNOSIS — I10 Essential (primary) hypertension: Secondary | ICD-10-CM

## 2023-07-25 DIAGNOSIS — I340 Nonrheumatic mitral (valve) insufficiency: Secondary | ICD-10-CM | POA: Diagnosis not present

## 2023-07-25 DIAGNOSIS — E7800 Pure hypercholesterolemia, unspecified: Secondary | ICD-10-CM

## 2023-07-25 DIAGNOSIS — Z87891 Personal history of nicotine dependence: Secondary | ICD-10-CM

## 2023-07-25 DIAGNOSIS — I2510 Atherosclerotic heart disease of native coronary artery without angina pectoris: Secondary | ICD-10-CM

## 2023-07-25 DIAGNOSIS — E781 Pure hyperglyceridemia: Secondary | ICD-10-CM

## 2023-07-25 DIAGNOSIS — Z789 Other specified health status: Secondary | ICD-10-CM

## 2023-07-25 MED ORDER — FUROSEMIDE 20 MG PO TABS: 20.0000 mg | ORAL_TABLET | Freq: Every day | ORAL | Status: AC | PRN

## 2023-07-25 MED ORDER — NEXLIZET 180-10 MG PO TABS: 1.0000 | ORAL_TABLET | Freq: Every morning | ORAL | 3 refills | Status: DC

## 2023-07-25 NOTE — Progress Notes (Unsigned)
 Cardiology Office Note:  .   Date:  07/25/2023  ID:  [name], DOB Dec 28, 1945, MRN [id number] PCP:  Park Meo, FNP  Former Cardiology Providers: NA [facility] Providers Cardiologist:  Tessa Lerner, DO , [facility] (established care 12/23/2021) Electrophysiologist:  None  Click to update primary MD,subspecialty MD or APP then REFRESH:1}    Chief Complaint  Patient presents with   Premature ventricular complex   Mitral Regurgitation   CAC   Follow-up    History of Present Illness: .   Brett [name] is a 78 y.o. Caucasian male whose past medical history and cardiovascular risk factors includes: Mitral regurgitation, hyperlipidemia, statin intolerance, GERD, hypertension, hiatal hernia, iron deficiency anemia, former smoker, hypertriglyceridemia, OSA and CPAP.   Patient is currently being followed at the practice given his mitral regurgitation, coronary artery calcification, and PVCs.  Last seen in the office by myself in September 2024.  At that time patient was being considered for back surgery.   In January 2025 patient saw Robin Searing nurse practitioner at that time he was endorsing feeling short of breath, dizzy, swelling, 15 pounds of weight gain since October 2024.  He underwent additional blood work as outlined below.  Echocardiogram was performed which noted preserved LVEF, grade 1 diastolic dysfunction and overall improvement in his valvular heart disease.  Myocardial perfusion imaging was also performed to rule out ischemia and the study was reported to be low risk.  In February 2025 patient had gone to his PCP at the [place] and was noted to have soft blood pressures.  Now he was endorsing episodes of dizzy and dehydrated and he was recommended by our office to reduce chlorthalidone to 12.5 mg p.o. daily he presents today for follow-up.  ***   Review of Systems: .   Review of Systems  Cardiovascular:  Negative for chest pain, claudication, irregular heartbeat, leg  swelling, near-syncope, orthopnea, palpitations, paroxysmal nocturnal dyspnea and syncope.  Respiratory:  Negative for shortness of breath.   Hematologic/Lymphatic: Negative for bleeding problem.    Studies Reviewed:    Echocardiogram: 01/29/2022: LVEF 60 to 65%, grade 1 diastolic dysfunction, normal LAP, moderate MR, mild TR, see report  05/26/2023:  1. Left ventricular ejection fraction, by estimation, is 60 to 65%. The  left ventricle has normal function. The left ventricle has no regional  wall motion abnormalities. Left ventricular diastolic parameters are  consistent with Grade I diastolic  dysfunction (impaired relaxation).   2. Right ventricular systolic function is normal. The right ventricular  size is normal. There is normal pulmonary artery systolic pressure. The  estimated right ventricular systolic pressure is 27.4 mmHg.   3. The mitral valve is degenerative. Trivial mitral valve regurgitation.  No evidence of mitral stenosis.   4. The aortic valve is tricuspid. Aortic valve regurgitation is trivial.  Aortic valve sclerosis/calcification is present, without any evidence of  aortic stenosis. Aortic regurgitation PHT measures 694 msec.   5. Aortic dilatation noted. There is mild dilatation of the aortic root,  measuring 39 mm.   6. The inferior vena cava is normal in size with greater than 50%  respiratory variability, suggesting right atrial pressure of 3 mmHg.    Stress Testing: MPI 05/2023   LV perfusion is normal. There is no evidence of ischemia. There is no evidence of infarction.   Left ventricular function is normal. Nuclear stress EF: 62%. The left ventricular ejection fraction is normal (55-65%). End diastolic cavity size is normal.  Averge exercise capacity (6:36 min:s; 7.2 METS). Normal HR/BP response to exercise.   The study is normal. The study is low risk.   Heart Catheterization: None   CT Cardiac Scoring: October 22, 2022 1. Coronary calcium score of  56. This was 25th percentile for age,gender, and race matched controls.  2. Aortic atherosclerosis.  Noncardiac findings: No significant extracardiac findings within the visualized chest.    Zio patch April 2024   Predominant rhythm was normal sinus rhythm with average heart rate 66bpm and ranged from 47 to 141bpm.   Rare PCAs, atrial couplets and atrial triplets   Occasional PVCs (PVC load 3.8%).  rare ventricular couplets, ventricular bigeminy and trigeminy   Nonsustained atrial tachycardia up to 11.4 seconds with fastest heart rate 190bpm  RADIOLOGY: NA  Risk Assessment/Calculations:   N/A   Labs:       Latest Ref Rng & Units 05/24/2023    3:53 PM 03/07/2023    8:42 AM 02/25/2023    6:01 PM  CBC  WBC 3.4 - 10.8 x10E3/uL 8.9  9.3  12.7   Hemoglobin 13.0 - 17.7 g/dL 65.7  84.6  96.2   Hematocrit 37.5 - 51.0 % 39.6  38.2  36.1   Platelets 150 - 450 x10E3/uL 341  406  308        Latest Ref Rng & Units 05/24/2023    3:53 PM 03/07/2023    8:42 AM 02/25/2023    6:01 PM  BMP  Glucose 70 - 99 mg/dL 84  952    BUN 8 - 27 mg/dL 15  14    Creatinine 8.41 - 1.27 mg/dL 3.24  4.01  0.27   BUN/Creat Ratio 10 - 24 14  SEE NOTE:    Sodium 134 - 144 mmol/L 139  136    Potassium 3.5 - 5.2 mmol/L 3.9  3.8    Chloride 96 - 106 mmol/L 98  94    CO2 20 - 29 mmol/L 26  33    Calcium 8.6 - 10.2 mg/dL 9.2  9.3        Latest Ref Rng & Units 05/24/2023    3:53 PM 03/07/2023    8:42 AM 02/25/2023    6:01 PM  CMP  Glucose 70 - 99 mg/dL 84  253    BUN 8 - 27 mg/dL 15  14    Creatinine 6.64 - 1.27 mg/dL 4.03  4.74  2.59   Sodium 134 - 144 mmol/L 139  136    Potassium 3.5 - 5.2 mmol/L 3.9  3.8    Chloride 96 - 106 mmol/L 98  94    CO2 20 - 29 mmol/L 26  33    Calcium 8.6 - 10.2 mg/dL 9.2  9.3    Total Protein 6.1 - 8.1 g/dL  6.5    Total Bilirubin 0.2 - 1.2 mg/dL  0.4    AST 10 - 35 U/L  9    ALT 9 - 46 U/L  15      Lab Results  Component Value Date   CHOL 188 03/07/2023   HDL 43  03/07/2023   LDLCALC 119 (H) 03/07/2023   TRIG 147 03/07/2023   CHOLHDL 4.4 03/07/2023   No results for input(s): "LIPOA" in the last 8760 hours. No components found for: "NTPROBNP" Recent Labs    05/24/23 1553  PROBNP <36   No results for input(s): "TSH" in the last 8760 hours.  External Labs: Collected: 03/07/2023 [facility] database. Total cholesterol  188, triglycerides 147, HDL 43, LDL 119. Hemoglobin 12.3 g/dL  Physical Exam:    Today's Vitals   07/25/23 0818  BP: 122/66  Pulse: (!) 59  Resp: 16  SpO2: 94%  Weight: 192 lb 12.8 oz (87.5 kg)  Height: 5\' 8"  (1.727 m)   Body mass index is 29.32 kg/m. Wt Readings from Last 3 Encounters:  07/25/23 192 lb 12.8 oz (87.5 kg)  06/17/23 189 lb 9.5 oz (86 kg)  06/09/23 189 lb 9.5 oz (86 kg)    Physical Exam  Constitutional: No distress.  Age appropriate, hemodynamically stable.   HENT:  Frank-sign  Neck: No JVD present.  Cardiovascular: Normal rate, regular rhythm, S1 normal, S2 normal, intact distal pulses and normal pulses. Exam reveals no gallop, no S3 and no S4.  Murmur heard. Holosystolic murmur is present with a grade of 3/6 at the apex. Pulses:      Dorsalis pedis pulses are 2+ on the right side and 2+ on the left side.       Posterior tibial pulses are 2+ on the right side and 2+ on the left side.  Pulmonary/Chest: Effort normal and breath sounds normal. No stridor. He has no wheezes. He has no rales.  Abdominal: Soft. Bowel sounds are normal. He exhibits no distension. There is no abdominal tenderness.  Musculoskeletal:        General: No edema.     Cervical back: Neck supple.  Neurological: He is alert and oriented to person, place, and time. He has intact cranial nerves (2-12).  Skin: Skin is warm and moist.     Impression & Recommendation(s):  Impression: No diagnosis found.   Recommendation(s):  ***  Orders Placed:  No orders of the defined types were placed in this encounter.   As part of medical  decision making ***  Final Medication List:   No orders of the defined types were placed in this encounter.   Medications Discontinued During This Encounter  Medication Reason   ondansetron (ZOFRAN) 4 MG tablet Completed Course   oxyCODONE-acetaminophen (PERCOCET) 5-325 MG tablet Completed Course   potassium chloride (KLOR-CON M) 10 MEQ tablet Completed Course     Current Outpatient Medications:    acetaminophen (TYLENOL) 500 MG tablet, Take 1,000 mg by mouth every 6 (six) hours as needed for moderate pain., Disp: , Rfl:    baclofen (LIORESAL) 10 MG tablet, Take 10 mg by mouth at bedtime., Disp: , Rfl:    chlorthalidone (HYGROTON) 25 MG tablet, Take 0.5 tablets (12.5 mg total) by mouth every morning., Disp: , Rfl:    colestipol (COLESTID) 1 g tablet, Take 1 tablet (1 g total) by mouth 2 (two) times daily., Disp: 180 tablet, Rfl: 3   diltiazem (CARDIZEM CD) 180 MG 24 hr capsule, Take 180 mg by mouth daily., Disp: , Rfl:    ezetimibe (ZETIA) 10 MG tablet, Take 1 tablet (10 mg total) by mouth at bedtime., Disp: 90 tablet, Rfl: 1   famotidine (PEPCID) 40 MG tablet, Take 1 tablet (40 mg total) by mouth daily., Disp: 90 tablet, Rfl: 1   fenofibrate (TRICOR) 48 MG tablet, Take 1 tablet (48 mg total) by mouth daily., Disp: 90 tablet, Rfl: 3   furosemide (LASIX) 20 MG tablet, Take 1 tablet (20 mg total) by mouth daily., Disp: 90 tablet, Rfl: 3   losartan (COZAAR) 100 MG tablet, Take 100 mg by mouth daily., Disp: , Rfl:    methocarbamol (ROBAXIN) 500 MG tablet, Take 1 tablet (500 mg  total) by mouth every 6 (six) hours as needed for muscle spasms., Disp: 60 tablet, Rfl: 1   pantoprazole (PROTONIX) 40 MG tablet, Take 1 tablet (40 mg total) by mouth 2 (two) times daily. (Patient taking differently: Take 40 mg by mouth daily.), Disp: 180 tablet, Rfl: 3   tamsulosin (FLOMAX) 0.4 MG CAPS capsule, TAKE 1 CAPSULE BY MOUTH DAILY, Disp: 90 capsule, Rfl: 3  Consent:   ***  Disposition:   *** Patient may  be asked to follow-up sooner based on the results of the above-mentioned testing.  His questions and concerns were addressed to his satisfaction. He voices understanding of the recommendations provided during this encounter.    Signed, Tessa Lerner, DO, [facility]  [facility]  [address] [place], [place] [address] 07/25/2023 8:33 AM

## 2023-07-25 NOTE — Patient Instructions (Addendum)
 Medication Instructions:  Your physician has recommended you make the following change in your medication:   STOP Ezetimibe (Zetia)  START Nexlizet 180-10 mg once daily   CHANGE Furosemide (Lasix) to as needed   *For 1 lb gained in a day OR 3 lb gain in a week  **TAKE Diltiazem (Cardizem) in the MORNING  **TAKE Losartan in the EVENING    *If you need a refill on your cardiac medications before your next appointment, please call your pharmacy*  Lab Work: To be completed in 6 weeks: FASTING lipid panel, direct LDL, and CMP - (Approximately 09/05/23)  If you have labs (blood work) drawn today and your tests are completely normal, you will receive your results only by: MyChart Message (if you have MyChart) OR A paper copy in the mail If you have any lab test that is abnormal or we need to change your treatment, we will call you to review the results.  Testing/Procedures: None ordered today.  Follow-Up: At [facility], you and your health needs are our priority.  As part of our continuing mission to provide you with exceptional heart care, we have created designated Provider Care Teams.  These Care Teams include your primary Cardiologist (physician) and Advanced Practice Providers (APPs -  Physician Assistants and Nurse Practitioners) who all work together to provide you with the care you need, when you need it.  We recommend signing up for the patient portal called "MyChart".  Sign up information is provided on this After Visit Summary.  MyChart is used to connect with patients for Virtual Visits (Telemedicine).  Patients are able to view lab/test results, encounter notes, upcoming appointments, etc.  Non-urgent messages can be sent to your provider as well.   To learn more about what you can do with MyChart, go to [email or web address].    Your next appointment:   6 month(s)  The format for your next appointment:   In Person  Provider:   Tessa Lerner, DO {

## 2023-07-26 ENCOUNTER — Encounter: Payer: Self-pay | Admitting: Cardiology

## 2023-07-26 ENCOUNTER — Other Ambulatory Visit (HOSPITAL_COMMUNITY): Payer: Self-pay

## 2023-07-26 NOTE — Telephone Encounter (Signed)
 Could we try patient assistance?  Brett [name] [place], DO, [facility]

## 2023-07-27 ENCOUNTER — Telehealth: Payer: Self-pay

## 2023-07-27 ENCOUNTER — Other Ambulatory Visit (HOSPITAL_COMMUNITY): Payer: Self-pay

## 2023-07-27 NOTE — Telephone Encounter (Signed)
 Patient Advocate Encounter   The patient was approved for a [employer] grant that will help cover the cost of NEXLIZET Total amount awarded, $2,500.  Effective: 06/27/23 - 06/24/24   HYQ:[id number] NGE:[id number] [id number]   Pharmacy provided with approval and processing information. Patient informed via Dorcas Carrow, CPhT  Pharmacy Patient Advocate Specialist  Direct Number: [phone] Fax: [phone]

## 2023-07-27 NOTE — Telephone Encounter (Signed)
 Enrolled in grant. Faxed grant billing info and instructions to pharmacy. Pt informed via mychart.

## 2023-08-16 ENCOUNTER — Encounter: Payer: Self-pay | Admitting: Family Medicine

## 2023-08-21 ENCOUNTER — Encounter: Payer: Self-pay | Admitting: Family Medicine

## 2023-08-22 ENCOUNTER — Telehealth: Payer: Self-pay | Admitting: Cardiology

## 2023-08-22 ENCOUNTER — Telehealth: Payer: Self-pay | Admitting: Pharmacy Technician

## 2023-08-22 ENCOUNTER — Other Ambulatory Visit (HOSPITAL_COMMUNITY): Payer: Self-pay

## 2023-08-22 NOTE — Telephone Encounter (Signed)
 Pt c/o medication issue:  1. Name of Medication: Bempedoic Acid-Ezetimibe (NEXLIZET) 180-10 MG TABS  2. How are you currently taking this medication (dosage and times per day)? As written   3. Are you having a reaction (difficulty breathing--STAT)? No   4. What is your medication issue? Pharmacy called in stating they need PA to fill this med.

## 2023-08-22 NOTE — Telephone Encounter (Signed)
 Pharmacy Patient Advocate Encounter   Received notification from Pt Calls Messages that prior authorization for nexlizet is required/requested.   Insurance verification completed.   The patient is insured through [facility] .   Per test claim: PA required; PA submitted to above mentioned insurance via CoverMyMeds Key/confirmation #/[id number] Status is pending

## 2023-08-22 NOTE — Telephone Encounter (Signed)
 PA request has been Submitted. New Encounter has been or will be created for follow up. For additional info see Pharmacy Prior Auth telephone encounter from 08/22/23.

## 2023-08-23 NOTE — Telephone Encounter (Signed)
 Pharmacy Patient Advocate Encounter  Received notification from [facility] that Prior Authorization for nexlizet has been APPROVED from 08/22/23 to 02/21/24   Patient has prescription at [facility] and he has a Engineer, manufacturing.   PA #/Case ID/Reference #:  [id number]

## 2023-08-24 ENCOUNTER — Encounter: Payer: Self-pay | Admitting: Cardiology

## 2023-08-25 ENCOUNTER — Other Ambulatory Visit: Payer: Self-pay

## 2023-08-25 DIAGNOSIS — I10 Essential (primary) hypertension: Secondary | ICD-10-CM

## 2023-08-25 MED ORDER — CHLORTHALIDONE 25 MG PO TABS: 12.5000 mg | ORAL_TABLET | Freq: Every morning | ORAL | 3 refills | Status: DC

## 2023-09-05 ENCOUNTER — Ambulatory Visit: Payer: Medicare HMO | Admitting: Family Medicine

## 2023-09-06 ENCOUNTER — Ambulatory Visit: Admitting: Family Medicine

## 2023-09-06 ENCOUNTER — Encounter: Payer: Self-pay | Admitting: Family Medicine

## 2023-09-06 ENCOUNTER — Other Ambulatory Visit: Payer: Self-pay

## 2023-09-06 ENCOUNTER — Encounter: Payer: Self-pay | Admitting: Cardiology

## 2023-09-06 VITALS — BP 120/72 | HR 50 | Temp 97.5°F | Ht 68.0 in | Wt 192.0 lb

## 2023-09-06 DIAGNOSIS — K219 Gastro-esophageal reflux disease without esophagitis: Secondary | ICD-10-CM

## 2023-09-06 DIAGNOSIS — I700 Atherosclerosis of aorta: Secondary | ICD-10-CM

## 2023-09-06 DIAGNOSIS — Z0000 Encounter for general adult medical examination without abnormal findings: Secondary | ICD-10-CM | POA: Insufficient documentation

## 2023-09-06 DIAGNOSIS — Z0001 Encounter for general adult medical examination with abnormal findings: Secondary | ICD-10-CM

## 2023-09-06 DIAGNOSIS — I10 Essential (primary) hypertension: Secondary | ICD-10-CM | POA: Diagnosis not present

## 2023-09-06 DIAGNOSIS — E782 Mixed hyperlipidemia: Secondary | ICD-10-CM | POA: Diagnosis not present

## 2023-09-06 DIAGNOSIS — I493 Ventricular premature depolarization: Secondary | ICD-10-CM

## 2023-09-06 MED ORDER — PANTOPRAZOLE SODIUM 40 MG PO TBEC: 40.0000 mg | DELAYED_RELEASE_TABLET | Freq: Two times a day (BID) | ORAL | 3 refills | Status: AC

## 2023-09-06 MED ORDER — FENOFIBRATE 48 MG PO TABS: 48.0000 mg | ORAL_TABLET | Freq: Every day | ORAL | 3 refills | Status: DC

## 2023-09-06 MED ORDER — TRAZODONE HCL 50 MG PO TABS: 50.0000 mg | ORAL_TABLET | Freq: Every day | ORAL | 1 refills | Status: DC

## 2023-09-06 MED ORDER — BACLOFEN 10 MG PO TABS
10.0000 mg | ORAL_TABLET | Freq: Every day | ORAL | 3 refills | Status: AC
Start: 2023-09-06 — End: ?

## 2023-09-06 NOTE — Assessment & Plan Note (Signed)

## 2023-09-06 NOTE — Assessment & Plan Note (Signed)
 Followed by cardiology, well controlled on Diltiazem .

## 2023-09-06 NOTE — Telephone Encounter (Signed)
 Chlorthalidone  25mg  HALF tab po qday.   MAR is correct   chlorthalidone  (HYGROTON ) 25 MG tablet, Take 0.5 tablets (12.5 mg total) by mouth every morning., Disp: , Rfl:    Brett Feagans, DO, [facility]

## 2023-09-06 NOTE — Progress Notes (Signed)
 Complete physical exam  [name]   DOB: 03-11-1946   78 y.o. Male  MRN: [id number]  Subjective:    Chief Complaint  Patient presents with   Follow-up    6 month follow up. Pt would like to discuss a sleep aid.     Brett [name] is a 78 y.o. male who presents today for a complete physical exam. He reports consuming a  heart healthy  diet. The patient does not participate in regular exercise at present. He generally feels well. He reports sleeping fairly well. He does not have additional problems to discuss today.   Mitral Regurgitation: asymptomatic, mild on ECHO  HLD: LDL not at goal, taking Zetia  10mg , fenofibrate  48 mg daily, recently started on Nexlizent 180/10mg  daily then lab recheck with cardiology, will send results  GERD: Protonix  40mg  BID, well controlled, gastritis, benign polyps, 1cm hiatal hernia on EGD  HTN: well controlled on chlorthalidone  12.5mg  daily, diltiazem  180mg  daily, and cozaar  100mg  daily. Lasix  PRN for weight gain of 1 pound per day or 3 pounds per week  Former smoker: CT 12/26/15 with pulmonary nodules bilaterally  Atherosclerosis: working on getting LDL to goal with cardiology, BP well controlled, A1c <6.5%   Most recent fall risk assessment:    09/06/2023   11:45 AM  Fall Risk   Falls in the past year? 0  Number falls in past yr: 0  Injury with Fall? 0  Risk for fall due to : No Fall Risks  Follow up Falls prevention discussed;Falls evaluation completed     Most recent depression screenings:    09/06/2023   11:45 AM 10/07/2022    2:34 PM  PHQ 2/9 Scores  PHQ - 2 Score 0 0  PHQ- 9 Score  0    Vision:Within last year and Dental: No current dental problems and Receives regular dental care  Patient Active Problem List   Diagnosis Date Noted   Physical exam, annual 09/06/2023   Puckering of macula, left eye 03/25/2023   Lumbar adjacent segment disease with spondylolisthesis 02/25/2023   Degenerative joint disease of  shoulder region 12/21/2022   Arthralgia 12/06/2022   Presence of intraocular lens 09/13/2022   Bilateral pseudophakia 09/13/2022   Encounter for general adult medical examination without abnormal findings 09/13/2022   Encounter for other general examination 09/13/2022   Encounter for fitting and adjustment of spectacles and contact lenses 09/13/2022   Unspecified retinal vascular occlusion 09/13/2022   Obstructive sleep apnea (adult) (pediatric) 09/13/2022   Frequent unifocal PVCs 09/01/2022   Prediabetes 01/29/2021   Spondylolisthesis at L4-L5 level 01/21/2021   Osteoarthritis of facet joint of lumbar spine 12/16/2020   Body mass index (BMI) 27.0-27.9, adult 12/16/2020   Aortic atherosclerosis (HCC) 10/09/2020   Ulnar neuropathy 10/06/2020   Stenosis of intervertebral foramina 10/06/2020   BPH (benign prostatic hyperplasia) 07/29/2020   Lumbar radiculopathy 06/24/2020   Right leg paresthesias 03/10/2020   DNR (do not resuscitate) 07/31/2019   BPV (benign positional vertigo) 05/19/2018   Pain in joint of right shoulder 05/31/2017   S/P shoulder replacement, right 04/15/2017   Osteoarthritis of right hip 12/13/2016   Essential (primary) hypertension 04/10/2014   Insomnia due to stress 04/10/2014   DDD (degenerative disc disease), cervical 04/10/2014   Mixed hyperlipidemia 04/13/2013   GERD (gastroesophageal reflux disease) 04/13/2013   OSA on CPAP 04/13/2013   Chronic shoulder pain 04/13/2013   Past Medical History:  Diagnosis Date   Allergy    seasonal  Arthritis    Cataract    Diverticulosis    GERD (gastroesophageal reflux disease)    Heart murmur    History of hiatal hernia    Hyperlipidemia    Hypertension    IBS (irritable colon syndrome)    Irregular heart beat    Pneumonia    hx. of   PONV (postoperative nausea and vomiting)    with gallbladder surgery   SCC (squamous cell carcinoma) 01/02/2019   R [id number]   Sleep apnea    cpap   Vertigo    Past  Surgical History:  Procedure Laterality Date   CARPAL TUNNEL RELEASE Bilateral 05/2022   CHOLECYSTECTOMY  2004   Post-cholecystectomy syndrome    COLONOSCOPY     ESOPHAGOGASTRODUODENOSCOPY     EYE SURGERY     cataract   FRACTURE SURGERY  2006   fx. wrist   JOINT REPLACEMENT     KNEE SURGERY Left 2020   LUMBAR DISC SURGERY     L4-L5 back surgery x 2   REVERSE SHOULDER ARTHROPLASTY Left 06/17/2023   Procedure: REVERSE SHOULDER ARTHROPLASTY;  Surgeon: Winston Hawking, MD;  Location: [facility];  Service: Orthopedics;  Laterality: Left;  interscalene block   rhino plasty     deviated septum   SHOULDER SURGERY  2011   rotator cuff   TOTAL HIP ARTHROPLASTY Right 12/13/2016   Procedure: RIGHT TOTAL HIP ARTHROPLASTY ANTERIOR APPROACH;  Surgeon: Adonica Hoose, MD;  Location: [facility] OR;  Service: Orthopedics;  Laterality: Right;   TOTAL SHOULDER ARTHROPLASTY Right 04/15/2017   Procedure: RIGHT TOTAL SHOULDER ARTHROPLASTY;  Surgeon: Winston Hawking, MD;  Location: [facility] OR;  Service: Orthopedics;  Laterality: Right;   TYMPANOSTOMY TUBE PLACEMENT Left 2020   UPPER GASTROINTESTINAL ENDOSCOPY     VASECTOMY  1980   Social History   Tobacco Use   Smoking status: Former    Current packs/day: 0.00    Average packs/day: 2.0 packs/day for 51.0 years (102.0 ttl pk-yrs)    Types: Cigarettes, Cigars    Start date: 1963    Quit date: 2012    Years since quitting: 13.3   Smokeless tobacco: Never  Vaping Use   Vaping status: Never Used  Substance Use Topics   Alcohol use: Not Currently   Drug use: No   Social History   Socioeconomic History   Marital status: Married    Spouse name: Not on file   Number of children: 2   Years of education: Not on file   Highest education level: Bachelor's degree (e.g., BA, AB, BS)  Occupational History   Not on file  Tobacco Use   Smoking status: Former    Current packs/day: 0.00    Average packs/day: 2.0 packs/day for 51.0 years (102.0 ttl pk-yrs)    Types:  Cigarettes, Cigars    Start date: 15    Quit date: 2012    Years since quitting: 13.3   Smokeless tobacco: Never  Vaping Use   Vaping status: Never Used  Substance and Sexual Activity   Alcohol use: Not Currently   Drug use: No   Sexual activity: Yes  Other Topics Concern   Not on file  Social History Narrative   Not on file   Social Drivers of Health   Financial Resource Strain: Low Risk  (09/02/2023)   Overall Financial Resource Strain (CARDIA)    Difficulty of Paying Living Expenses: Not hard at all  Food Insecurity: No Food Insecurity (09/02/2023)   Hunger Vital Sign  Worried About Programme researcher, broadcasting/film/video in the Last Year: Never true    Ran Out of Food in the Last Year: Never true  Transportation Needs: No Transportation Needs (09/02/2023)   PRAPARE - Administrator, Civil Service (Medical): No    Lack of Transportation (Non-Medical): No  Physical Activity: Sufficiently Active (09/02/2023)   Exercise Vital Sign    Days of Exercise per Week: 6 days    Minutes of Exercise per Session: 120 min  Stress: Stress Concern Present (09/02/2023)   [employer] of Occupational Health - Occupational Stress Questionnaire    Feeling of Stress : Rather much  Social Connections: Moderately Isolated (09/02/2023)   Social Connection and Isolation Panel [NHANES]    Frequency of Communication with Friends and Family: Once a week    Frequency of Social Gatherings with Friends and Family: Twice a week    Attends Religious Services: Never    Database administrator or Organizations: No    Attends Banker Meetings: Never    Marital Status: Married  Catering manager Violence: Not At Risk (02/26/2023)   Humiliation, Afraid, Rape, and Kick questionnaire    Fear of Current or Ex-Partner: No    Emotionally Abused: No    Physically Abused: No    Sexually Abused: No   Family Status  Relation Name Status   Mother  Deceased at age 69   Father  Deceased at age 49    Sister  Alive   Sister  Alive   MGM  Deceased   MGF  Deceased   PGM  Deceased   PGF  Deceased   Neg Hx  (Not Specified)  No partnership data on file   Family History  Problem Relation Age of Onset   Cancer Sister        uterine    Cancer Maternal Grandmother    Colon cancer Neg Hx    Colon polyps Neg Hx    Esophageal cancer Neg Hx    Rectal cancer Neg Hx    Stomach cancer Neg Hx    Allergies  Allergen Reactions   2,4-D Dimethylamine Other (See Comments)   Statins     myalgias   Tramadol Nausea Only      Patient Care Team: Jenelle Mis, FNP as PCP - General (Family Medicine) Olinda Bertrand, DO as PCP - Cardiology (Cardiology) Devon Fogo, MD (Inactive) as Consulting Physician (Dermatology) Olinda Bertrand, DO as Consulting Physician (Cardiology) Audie Bleacher, MD as Consulting Physician (Neurosurgery) Ronn Cohn, MD as Consulting Physician (Orthopedic Surgery) [facility]   Outpatient Medications Prior to Visit  Medication Sig   acetaminophen  (TYLENOL ) 500 MG tablet Take 1,000 mg by mouth every 6 (six) hours as needed for moderate pain.   Bempedoic Acid-Ezetimibe  (NEXLIZET ) 180-10 MG TABS Take 1 tablet by mouth in the morning.   chlorthalidone  (HYGROTON ) 25 MG tablet Take 0.5 tablets (12.5 mg total) by mouth every morning.   colestipol  (COLESTID ) 1 g tablet Take 1 tablet (1 g total) by mouth 2 (two) times daily.   diltiazem  (CARDIZEM  CD) 180 MG 24 hr capsule Take 180 mg by mouth in the morning.   ezetimibe  (ZETIA ) 10 MG tablet Take 10 mg by mouth daily.   famotidine  (PEPCID ) 40 MG tablet Take 1 tablet (40 mg total) by mouth daily.   furosemide  (LASIX ) 20 MG tablet Take 1 tablet (20 mg total) by mouth daily as needed. Take for 1 lb weight gain in a day  OR 3 lb weight gain in a week.   losartan  (COZAAR ) 100 MG tablet Take 100 mg by mouth every evening.   methocarbamol  (ROBAXIN ) 500 MG tablet Take 1 tablet (500 mg total) by mouth every 6 (six) hours as  needed for muscle spasms.   tamsulosin  (FLOMAX ) 0.4 MG CAPS capsule TAKE 1 CAPSULE BY MOUTH DAILY   [DISCONTINUED] baclofen  (LIORESAL ) 10 MG tablet Take 10 mg by mouth at bedtime.   [DISCONTINUED] fenofibrate  (TRICOR ) 48 MG tablet Take 1 tablet (48 mg total) by mouth daily.   [DISCONTINUED] pantoprazole  (PROTONIX ) 40 MG tablet Take 1 tablet (40 mg total) by mouth 2 (two) times daily. (Patient taking differently: Take 40 mg by mouth daily.)   No facility-administered medications prior to visit.    Review of Systems  Constitutional: Negative.   HENT: Negative.    Eyes: Negative.   Respiratory: Negative.    Cardiovascular: Negative.   Gastrointestinal: Negative.   Genitourinary: Negative.   Musculoskeletal:  Positive for back pain and joint pain.  Skin: Negative.   Neurological: Negative.   Endo/Heme/Allergies: Negative.   Psychiatric/Behavioral: Negative.    All other systems reviewed and are negative.         Objective:     BP 120/72   Pulse (!) 50   Temp (!) 97.5 F (36.4 C)   Ht 5\' 8"  (1.727 m)   Wt 192 lb (87.1 kg)   SpO2 97%   BMI 29.19 kg/m  BP Readings from Last 3 Encounters:  09/06/23 120/72  07/25/23 122/66  06/17/23 116/62   Wt Readings from Last 3 Encounters:  09/06/23 192 lb (87.1 kg)  07/25/23 192 lb 12.8 oz (87.5 kg)  06/17/23 189 lb 9.5 oz (86 kg)      Physical Exam Vitals and nursing note reviewed.  Constitutional:      Appearance: Normal appearance. He is normal weight.  HENT:     Head: Normocephalic and atraumatic.     Right Ear: Tympanic membrane, ear canal and external ear normal.     Left Ear: Ear canal and external ear normal. A PE tube is present.     Nose: Nose normal.     Mouth/Throat:     Mouth: Mucous membranes are moist.     Pharynx: Oropharynx is clear.  Eyes:     Extraocular Movements: Extraocular movements intact.     Right eye: Normal extraocular motion and no nystagmus.     Left eye: Normal extraocular motion and no  nystagmus.     Conjunctiva/sclera: Conjunctivae normal.     Pupils: Pupils are equal, round, and reactive to light.  Cardiovascular:     Rate and Rhythm: Normal rate and regular rhythm.     Pulses: Normal pulses.     Heart sounds: Normal heart sounds.  Pulmonary:     Effort: Pulmonary effort is normal.     Breath sounds: Normal breath sounds.  Abdominal:     General: Bowel sounds are normal.     Palpations: Abdomen is soft.  Genitourinary:    Comments: Deferred using shared decision making Musculoskeletal:        General: Normal range of motion.     Cervical back: Normal range of motion and neck supple.  Skin:    General: Skin is warm and dry.     Capillary Refill: Capillary refill takes less than 2 seconds.  Neurological:     General: No focal deficit present.     Mental Status: He is alert.  Mental status is at baseline.  Psychiatric:        Mood and Affect: Mood normal.        Speech: Speech normal.        Behavior: Behavior normal.        Thought Content: Thought content normal.        Cognition and Memory: Cognition and memory normal.        Judgment: Judgment normal.      No results found for any visits on 09/06/23. Last CBC Lab Results  Component Value Date   WBC 8.9 05/24/2023   HGB 12.9 (L) 05/24/2023   HCT 39.6 05/24/2023   MCV 86 05/24/2023   MCH 28.0 05/24/2023   RDW 12.0 05/24/2023   PLT 341 05/24/2023   Last metabolic panel Lab Results  Component Value Date   GLUCOSE 84 05/24/2023   NA 139 05/24/2023   K 3.9 05/24/2023   CL 98 05/24/2023   CO2 26 05/24/2023   BUN 15 05/24/2023   CREATININE 1.08 05/24/2023   EGFR 71 05/24/2023   CALCIUM 9.2 05/24/2023   PROT 6.5 03/07/2023   ALBUMIN  4.1 09/20/2016   BILITOT 0.4 03/07/2023   ALKPHOS 49 09/20/2016   AST 9 (L) 03/07/2023   ALT 15 03/07/2023   ANIONGAP 9 02/09/2023   Last lipids Lab Results  Component Value Date   CHOL 188 03/07/2023   HDL 43 03/07/2023   LDLCALC 119 (H) 03/07/2023    TRIG 147 03/07/2023   CHOLHDL 4.4 03/07/2023   Last hemoglobin A1c Lab Results  Component Value Date   HGBA1C 5.8 (H) 01/29/2021   Last thyroid functions No results found for: "TSH", "T3TOTAL", "T4TOTAL", "THYROIDAB" Last vitamin D No results found for: "25OHVITD2", "25OHVITD3", "VD25OH" Last vitamin B12 and Folate Lab Results  Component Value Date   VITAMINB12 270 10/31/2019        Assessment & Plan:    Routine Health Maintenance and Physical Exam  Immunization History  Administered Date(s) Administered   Fluad Quad(high Dose 65+) 02/22/2019, 02/26/2020   Fluad Trivalent(High Dose 65+) 03/07/2023   Influenza, High Dose Seasonal PF 05/04/2017, 02/21/2018, 01/29/2021   Influenza,inj,Quad PF,6+ Mos 06/18/2016   Influenza-Unspecified 04/16/2017, 02/22/2019, 02/26/2020, 02/08/2022   Moderna Covid-19 Fall Seasonal Vaccine 38yrs & older 02/08/2022   Moderna Sars-Covid-2 Vaccination 03/07/2023   PFIZER(Purple Top)SARS-COV-2 Vaccination 06/25/2019, 07/20/2019, 03/29/2020, 08/24/2020   Pfizer Covid-19 Vaccine Bivalent Booster 43yrs & up 02/02/2021   Pfizer(Comirnaty)Fall Seasonal Vaccine 12 years and older 02/08/2022   Pneumococcal Conjugate-13 10/09/2014   Pneumococcal Polysaccharide-23 10/09/2014   Rsv, Bivalent, Protein Subunit Rsvpref,pf (Abrysvo) 02/08/2022   Td (Adult),5 Lf Tetanus Toxid, Preservative Free 07/13/2022   Tdap 12/15/2012   Zoster Recombinant(Shingrix) 02/26/2020, 05/13/2020   Zoster, Live 05/17/2010    Health Maintenance  Topic Date Due   Lung Cancer Screening  12/25/2016   Medicare Annual Wellness (AWV)  10/07/2023   COVID-19 Vaccine (8 - 2024-25 season) 09/22/2023 (Originally 05/02/2023)   INFLUENZA VACCINE  12/16/2023   DTaP/Tdap/Td (3 - Td or Tdap) 07/13/2032   Pneumonia Vaccine 53+ Years old  Completed   Hepatitis C Screening  Completed   Zoster Vaccines- Shingrix  Completed   HPV VACCINES  Aged Out   Meningococcal B Vaccine  Aged Out    Colonoscopy  Discontinued    Discussed health benefits of physical activity, and encouraged him to engage in regular exercise appropriate for his age and condition.  Problem List Items Addressed This Visit  Mixed hyperlipidemia   Continue Fenofibrate , Colestid , Zetia , Nexlizant.  Labs with cardiology in 2 weeks, will send results.  I recommend consuming a heart healthy diet such as Mediterranean diet or DASH diet with whole grains, fruits, vegetable, fish, lean meats, nuts, and olive oil. Limit sweets and processed foods. I also encourage moderate intensity exercise 150 minutes weekly. This is 3-5 times weekly for 30-50 minutes each session. Goal should be pace of 3 miles/hours, or walking 1.5 miles in 30 minutes. The 10-year ASCVD risk score (Arnett DK, et al., 2019) is: 28%       Relevant Medications   ezetimibe  (ZETIA ) 10 MG tablet   fenofibrate  (TRICOR ) 48 MG tablet   GERD (gastroesophageal reflux disease)   Well controlled on Protonix  40mg  BID. Continue anti-reflux measures such as raising the head of the bed, avoiding tight clothing or belts, avoiding eating late at night and not lying down shortly after mealtime and achieving weight loss are discussed. Avoid ASA, NSAID's, caffeine, peppermints, alcohol and tobacco. Alert me if there are persistent symptoms, dysphagia, weight loss or GI bleeding.      Relevant Medications   baclofen  (LIORESAL ) 10 MG tablet   pantoprazole  (PROTONIX ) 40 MG tablet   Essential (primary) hypertension   Chronic. Well controlled. Followed by cardiology. Continue Diltiazem  180mg  daily, Losartan  100mg  daily, Chlorthalidone  12.5mg  daily, Lasix  PRN. Recommend heart healthy diet such as Mediterranean diet with whole grains, fruits, vegetable, fish, lean meats, nuts, and olive oil. Limit salt. Encouraged moderate walking, 3-5 times/week for 30-50 minutes each session. Aim for at least 150 minutes.week. Goal should be pace of 3 miles/hours, or walking 1.5 miles in  30 minutes. Avoid tobacco products. Avoid excess alcohol. Take medications as prescribed and bring medications and blood pressure log with cuff to each office visit. Seek medical care for chest pain, palpitations, shortness of breath with exertion, dizziness/lightheadedness, vision changes, recurrent headaches, or swelling of extremities. Follow up in 6 months.      Relevant Medications   ezetimibe  (ZETIA ) 10 MG tablet   fenofibrate  (TRICOR ) 48 MG tablet   Aortic atherosclerosis (HCC)   LDL not at goal, recently started Nexlizent with cardiology, BP and A1c well controlled, on ASA      Relevant Medications   ezetimibe  (ZETIA ) 10 MG tablet   fenofibrate  (TRICOR ) 48 MG tablet   Frequent unifocal PVCs   Followed by cardiology, well controlled on Diltiazem .      Relevant Medications   ezetimibe  (ZETIA ) 10 MG tablet   fenofibrate  (TRICOR ) 48 MG tablet   Physical exam, annual - Primary   Today your medical history was reviewed and routine physical exam with labs was performed. Recommend 150 minutes of moderate intensity exercise weekly and consuming a well-balanced diet. Advised to stop smoking if a smoker, avoid smoking if a non-smoker, limit alcohol consumption to 1 drink per day for women and 2 drinks per day for men, and avoid illicit drug use. Counseled in mental health awareness and when to seek medical care. Vaccine maintenance discussed. Appropriate health maintenance items reviewed. Return to office in 1 year for annual physical exam.       Return in about 6 months (around 03/07/2024) for chronic follow-up with labs 1 week prior.     Jenelle Mis, FNP

## 2023-09-06 NOTE — Assessment & Plan Note (Signed)
 Continue Fenofibrate , Colestid , Zetia , Nexlizant.  Labs with cardiology in 2 weeks, will send results.  I recommend consuming a heart healthy diet such as Mediterranean diet or DASH diet with whole grains, fruits, vegetable, fish, lean meats, nuts, and olive oil. Limit sweets and processed foods. I also encourage moderate intensity exercise 150 minutes weekly. This is 3-5 times weekly for 30-50 minutes each session. Goal should be pace of 3 miles/hours, or walking 1.5 miles in 30 minutes. The 10-year ASCVD risk score (Arnett DK, et al., 2019) is: 28%

## 2023-09-06 NOTE — Assessment & Plan Note (Signed)
 Chronic. Well controlled. Followed by cardiology. Continue Diltiazem  180mg  daily, Losartan  100mg  daily, Chlorthalidone  12.5mg  daily, Lasix  PRN. Recommend heart healthy diet such as Mediterranean diet with whole grains, fruits, vegetable, fish, lean meats, nuts, and olive oil. Limit salt. Encouraged moderate walking, 3-5 times/week for 30-50 minutes each session. Aim for at least 150 minutes.week. Goal should be pace of 3 miles/hours, or walking 1.5 miles in 30 minutes. Avoid tobacco products. Avoid excess alcohol. Take medications as prescribed and bring medications and blood pressure log with cuff to each office visit. Seek medical care for chest pain, palpitations, shortness of breath with exertion, dizziness/lightheadedness, vision changes, recurrent headaches, or swelling of extremities. Follow up in 6 months.

## 2023-09-06 NOTE — Assessment & Plan Note (Signed)
 LDL not at goal, recently started Nexlizent with cardiology, BP and A1c well controlled, on ASA

## 2023-09-06 NOTE — Assessment & Plan Note (Signed)
 Well controlled on Protonix  40mg  BID. Continue anti-reflux measures such as raising the head of the bed, avoiding tight clothing or belts, avoiding eating late at night and not lying down shortly after mealtime and achieving weight loss are discussed. Avoid ASA, NSAID's, caffeine, peppermints, alcohol and tobacco. Alert me if there are persistent symptoms, dysphagia, weight loss or GI bleeding.

## 2023-09-07 ENCOUNTER — Other Ambulatory Visit: Payer: Self-pay | Admitting: Family Medicine

## 2023-09-07 DIAGNOSIS — Z87891 Personal history of nicotine dependence: Secondary | ICD-10-CM

## 2023-09-15 ENCOUNTER — Encounter: Payer: Self-pay | Admitting: Cardiology

## 2023-09-28 ENCOUNTER — Other Ambulatory Visit: Payer: Self-pay | Admitting: Family Medicine

## 2023-10-05 ENCOUNTER — Ambulatory Visit (INDEPENDENT_AMBULATORY_CARE_PROVIDER_SITE_OTHER): Admitting: Gastroenterology

## 2023-10-05 ENCOUNTER — Encounter: Payer: Self-pay | Admitting: Gastroenterology

## 2023-10-05 VITALS — BP 122/70 | HR 52 | Ht 68.0 in | Wt 190.4 lb

## 2023-10-05 DIAGNOSIS — M6208 Separation of muscle (nontraumatic), other site: Secondary | ICD-10-CM

## 2023-10-05 DIAGNOSIS — K219 Gastro-esophageal reflux disease without esophagitis: Secondary | ICD-10-CM | POA: Diagnosis not present

## 2023-10-05 DIAGNOSIS — Z79899 Other long term (current) drug therapy: Secondary | ICD-10-CM | POA: Diagnosis not present

## 2023-10-05 DIAGNOSIS — K915 Postcholecystectomy syndrome: Secondary | ICD-10-CM | POA: Diagnosis not present

## 2023-10-05 NOTE — Progress Notes (Signed)
 HPI :  Discussed the use of AI scribe software for clinical note transcription with the patient, who gave verbal consent to proceed.  History of Present Illness   78 year old male with GERD, iron deficiency anemia, and post cholecystectomy related loose stools who presents for a follow-up visit.  He has a long-standing history of gastroesophageal reflux disease (GERD) and is on chronic proton pump inhibitor (PPI) therapy. He takes Protonix  40 mg twice daily, which has significantly reduced his reflux symptoms. He previously attempted to use Dexilant , but it was not covered by his insurance. An upper endoscopy in 2021 for iron deficiency and Barrett's esophagus screening revealed a 1 cm hiatal hernia and gastritis, but no Barrett's esophagus. Biopsies taken during the procedure were negative for H. pylori. He experiences occasional breakthrough reflux, particularly after consuming certain foods like bread. No swallowing difficulties. At the last visit we added baclofen  10 mg at bedtime for nocturnal reflux symptoms, which has been beneficial in reducing nighttime reflux and improving sleep. No significant side effects from the medication.  He has a history of iron deficiency anemia, for which he underwent an upper endoscopy in 2021. The procedure did not reveal any evidence of Barrett's esophagus, and his colonoscopy at that time was normal with no polyps or microscopic colitis.  Post cholecystectomy, he experiences chronic diarrhea, described as 'rapid transit' occurring unpredictably about two to three times a month. He takes Colestid  twice daily, which effectively manages his symptoms, typically resulting in one bowel movement per day. The frequency of diarrhea can vary based on his diet and other factors. No blood in his stool.  [facility] showed no abnormalities, aside from known diverticulosis and a small hiatal hernia. He  reports a noticeable protuberance when flexing his abdominal muscles, described as a 'little mound,' but it does not cause him pain.    Previous Endoscopic Evaluations: EGD 08/09/19 EGD for IDA and loose stool  -esophagogastric landmarks identified. - 1 cm hiatal hernia. - Normal esophagus otherwise - Two small benign gastric polyps. Resected and retrieved. - Gastritis. - Normal stomach otherwise - biopsies taken to rule out H pylori - Normal duodenal bulb and second portion of the duodenum. Biopsied   Colonoscopy 08/09/2019 for IDA and loose stool -Complete exam, good prep -Diverticulosis and internal hemorrhoids   Surgical [P], duodenal bulb - BENIGN SMALL BOWEL MUCOSA. - NO VILLOUS BLUNTING OR INCREASE IN INTRAEPITHELIAL LYMPHOCYTES. - NO DYSPLASIA OR MALIGNANCY. 2. Surgical [P], gastric antrum - MILD REACTIVE GASTROPATHY. Jhon Moselle IS NEGATIVE FOR HELICOBACTER PYLORI. - NO INTESTINAL METAPLASIA, DYSPLASIA, OR MALIGNANCY. 3. Surgical [P], gastric antrum, polyp - HYPERPLASTIC POLYP. - WARTHIN-STARRY IS NEGATIVE FOR HELICOBACTER PYLORI. - NO INTESTINAL METAPLASIA, DYSPLASIA, OR MALIGNANCY. 4. Surgical [P], random sites - BENIGN COLONIC MUCOSA. - NO ACTIVE INFLAMMATION OR EVIDENCE OF MICROSCOPIC COLITIS. - NO DYSPLASIA OR MALIGNANCY.   Past Medical History:  Diagnosis Date   Allergy    seasonal   Arthritis    Cataract    Diverticulosis    GERD (gastroesophageal reflux disease)    Heart murmur    History of hiatal hernia    Hyperlipidemia    Hypertension    IBS (irritable colon syndrome)    Irregular heart beat    Pneumonia    hx. of   PONV (postoperative nausea and vomiting)    with gallbladder surgery   SCC (squamous cell carcinoma) 01/02/2019  R [id number]   Sleep apnea    cpap   Vertigo      Past Surgical History:  Procedure Laterality Date   CARPAL TUNNEL RELEASE Bilateral 05/2022   CHOLECYSTECTOMY  2004   Post-cholecystectomy syndrome     COLONOSCOPY     ESOPHAGOGASTRODUODENOSCOPY     EYE SURGERY     cataract   FRACTURE SURGERY  2006   fx. wrist   JOINT REPLACEMENT     KNEE SURGERY Left 2020   LUMBAR DISC SURGERY     L4-L5 back surgery x 2   REVERSE SHOULDER ARTHROPLASTY Left 06/17/2023   Procedure: REVERSE SHOULDER ARTHROPLASTY;  Surgeon: Winston Hawking, MD;  Location: [facility];  Service: Orthopedics;  Laterality: Left;  interscalene block   rhino plasty     deviated septum   SHOULDER SURGERY  2011   rotator cuff   TOTAL HIP ARTHROPLASTY Right 12/13/2016   Procedure: RIGHT TOTAL HIP ARTHROPLASTY ANTERIOR APPROACH;  Surgeon: Adonica Hoose, MD;  Location: [facility] OR;  Service: Orthopedics;  Laterality: Right;   TOTAL SHOULDER ARTHROPLASTY Right 04/15/2017   Procedure: RIGHT TOTAL SHOULDER ARTHROPLASTY;  Surgeon: Winston Hawking, MD;  Location: [facility];  Service: Orthopedics;  Laterality: Right;   TYMPANOSTOMY TUBE PLACEMENT Left 2020   UPPER GASTROINTESTINAL ENDOSCOPY     VASECTOMY  1980   Family History  Problem Relation Age of Onset   Cancer Sister        uterine    Cancer Maternal Grandmother    Colon cancer Neg Hx    Colon polyps Neg Hx    Esophageal cancer Neg Hx    Rectal cancer Neg Hx    Stomach cancer Neg Hx    Social History   Tobacco Use   Smoking status: Former    Current packs/day: 0.00    Average packs/day: 2.0 packs/day for 51.0 years (102.0 ttl pk-yrs)    Types: Cigarettes, Cigars    Start date: 1963    Quit date: 2012    Years since quitting: 13.3   Smokeless tobacco: Never  Vaping Use   Vaping status: Never Used  Substance Use Topics   Alcohol use: Not Currently   Drug use: No   Current Outpatient Medications  Medication Sig Dispense Refill   acetaminophen  (TYLENOL ) 500 MG tablet Take 1,000 mg by mouth every 6 (six) hours as needed for moderate pain.     baclofen  (LIORESAL ) 10 MG tablet Take 1 tablet (10 mg total) by mouth at bedtime. 90 each 3   Bempedoic Acid-Ezetimibe  (NEXLIZET ) 180-10  MG TABS Take 1 tablet by mouth in the morning. 90 tablet 3   chlorthalidone  (HYGROTON ) 25 MG tablet Take 0.5 tablets (12.5 mg total) by mouth every morning. 30 tablet 3   colestipol  (COLESTID ) 1 g tablet Take 1 tablet (1 g total) by mouth 2 (two) times daily. 180 tablet 3   diltiazem  (CARDIZEM  CD) 180 MG 24 hr capsule Take 180 mg by mouth in the morning.     ezetimibe  (ZETIA ) 10 MG tablet Take 10 mg by mouth daily.     famotidine  (PEPCID ) 40 MG tablet Take 1 tablet (40 mg total) by mouth daily. 90 tablet 1   fenofibrate  (TRICOR ) 48 MG tablet Take 1 tablet (48 mg total) by mouth daily. 90 tablet 3   furosemide  (LASIX ) 20 MG tablet Take 1 tablet (20 mg total) by mouth daily as needed. Take for 1 lb weight gain in a day OR 3 lb weight gain in a  week.     losartan  (COZAAR ) 100 MG tablet Take 100 mg by mouth every evening.     methocarbamol  (ROBAXIN ) 500 MG tablet Take 1 tablet (500 mg total) by mouth every 6 (six) hours as needed for muscle spasms. 60 tablet 1   pantoprazole  (PROTONIX ) 40 MG tablet Take 1 tablet (40 mg total) by mouth 2 (two) times daily. 180 tablet 3   tamsulosin  (FLOMAX ) 0.4 MG CAPS capsule TAKE 1 CAPSULE BY MOUTH DAILY 90 capsule 3   traZODone  (DESYREL ) 50 MG tablet TAKE 1 TABLET BY MOUTH AT  BEDTIME 30 tablet 11   No current facility-administered medications for this visit.   Allergies  Allergen Reactions   2,4-D Dimethylamine Other (See Comments)   Pravastatin  Other (See Comments)   Statins     myalgias   Tramadol Nausea Only     Review of Systems: All systems reviewed and negative except where noted in HPI.    Physical Exam: BP 122/70 (Cuff Size: Normal)   Pulse (!) 52   Ht 5\' 8"  (1.727 m)   Wt 190 lb 6.4 oz (86.4 kg)   BMI 28.95 kg/m  Constitutional: Pleasant,well-developed, male in no acute distress. Abdominal: Soft, nondistended, nontender. (+) diastasis recti small hernia mid abdomen.  Neurological: Alert and oriented to person place and  time. Psychiatric: Normal mood and affect. Behavior is normal.   ASSESSMENT: 78 y.o. male here for assessment of the following  1. Gastroesophageal reflux disease, unspecified whether esophagitis present   2. Long-term current use of proton pump inhibitor therapy   3. Post-cholecystectomy syndrome   4. Diastasis recti    Gastroesophageal reflux disease (GERD) / long term use of PPI Chronic GERD well-controlled with pantoprazole  and baclofen . Nocturnal symptoms improved. No Barrett's esophagus. Discussed pantoprazole  risks and encouraged lowest effective dose possibl. - Continue pantoprazole  40 mg twice daily. - Continue baclofen  10 mg at night. - Consider reducing pantoprazole  to 40 mg once daily if symptoms allow.  Postcholecystectomy diarrhea Chronic diarrhea managed with colestipol . Occasional rapid transit episodes. CT showed diverticulosis and small hiatal hernia. Discussed dose adjustment options. - Continue colestipol  1 g twice daily. - Consider increasing colestipol  to 2 g twice daily if symptoms worsen. - Consider adding loperamide if needed for breakthrough symptoms.  Diastasis recti Abdominal protuberance consistent with diastasis recti. No pain or significant discomfort. No immediate intervention required.  follow up one year or sooner with issues  Christi Coward, MD [facility]

## 2023-10-05 NOTE — Patient Instructions (Signed)
 Please follow up in 1 year.   Thank you for entrusting me with your care and for choosing [facility], Dr. Alvester Johnson  If your blood pressure at your visit was 140/90 or greater, please contact your primary care physician to follow up on this. ______________________________________________________  If you are age 77 or older, your body mass index should be between 23-30. Your Body mass index is 28.95 kg/m. If this is out of the aforementioned range listed, please consider follow up with your Primary Care Provider.  If you are age 70 or younger, your body mass index should be between 19-25. Your Body mass index is 28.95 kg/m. If this is out of the aformentioned range listed, please consider follow up with your Primary Care Provider.  ________________________________________________________  The [facility] providers would like to encourage you to use MYCHART to communicate with providers for non-urgent requests or questions.  Due to long hold times on the telephone, sending your provider a message by [facility] may be a faster and more efficient way to get a response.  Please allow 48 business hours for a response.  Please remember that this is for non-urgent requests.  _______________________________________________________  Due to recent changes in healthcare laws, you may see the results of your imaging and laboratory studies on MyChart before your provider has had a chance to review them.  We understand that in some cases there may be results that are confusing or concerning to you. Not all laboratory results come back in the same time frame and the provider may be waiting for multiple results in order to interpret others.  Please give us  48 hours in order for your provider to thoroughly review all the results before contacting the office for clarification of your results.

## 2023-10-13 ENCOUNTER — Ambulatory Visit: Payer: Medicare HMO

## 2023-10-25 LAB — LDL CHOLESTEROL, DIRECT

## 2023-10-26 ENCOUNTER — Ambulatory Visit

## 2023-10-26 VITALS — Ht 68.0 in | Wt 190.0 lb

## 2023-10-26 DIAGNOSIS — Z0000 Encounter for general adult medical examination without abnormal findings: Secondary | ICD-10-CM

## 2023-10-26 LAB — COMPREHENSIVE METABOLIC PANEL WITH GFR
ALT: 13 IU/L (ref 0–44)
AST: 15 IU/L (ref 0–40)
Albumin: 4.4 g/dL (ref 3.8–4.8)
Alkaline Phosphatase: 45 IU/L (ref 44–121)
BUN/Creatinine Ratio: 11 (ref 10–24)
BUN: 12 mg/dL (ref 8–27)
Bilirubin Total: 0.4 mg/dL (ref 0.0–1.2)
CO2: 21 mmol/L (ref 20–29)
Calcium: 9.1 mg/dL (ref 8.6–10.2)
Chloride: 101 mmol/L (ref 96–106)
Creatinine, Ser: 1.08 mg/dL (ref 0.76–1.27)
Globulin, Total: 2.2 g/dL (ref 1.5–4.5)
Glucose: 134 mg/dL — ABNORMAL HIGH (ref 70–99)
Potassium: 3.8 mmol/L (ref 3.5–5.2)
Sodium: 138 mmol/L (ref 134–144)
Total Protein: 6.6 g/dL (ref 6.0–8.5)
eGFR: 71 mL/min/1.73

## 2023-10-26 LAB — LIPID PANEL
Chol/HDL Ratio: 3.8 ratio (ref 0.0–5.0)
Cholesterol, Total: 142 mg/dL (ref 100–199)
HDL: 37 mg/dL — ABNORMAL LOW
LDL Chol Calc (NIH): 74 mg/dL (ref 0–99)
Triglycerides: 183 mg/dL — ABNORMAL HIGH (ref 0–149)
VLDL Cholesterol Cal: 31 mg/dL (ref 5–40)

## 2023-10-26 LAB — LDL CHOLESTEROL, DIRECT: LDL Direct: 68 mg/dL (ref 0–99)

## 2023-10-26 NOTE — Patient Instructions (Signed)
 Mr. Straughter , Thank you for taking time out of your busy schedule to complete your Annual Wellness Visit with me. I enjoyed our conversation and look forward to speaking with you again next year. I, as well as your care team,  appreciate your ongoing commitment to your health goals. Please review the following plan we discussed and let me know if I can assist you in the future. Your Game plan/ To Do List    Follow up Visits: Next Medicare AWV with our clinical staff: In 1 year    Have you seen your provider in the last 6 months (3 months if uncontrolled diabetes)? Yes Next Office Visit with your provider: 03/07/24 @ 8:00  Clinician Recommendations:  Aim for 30 minutes of exercise or brisk walking, 6-8 glasses of water, and 5 servings of fruits and vegetables each day.       This is a list of the screening recommended for you and due dates:  Health Maintenance  Topic Date Due   Screening for Lung Cancer  12/25/2016   COVID-19 Vaccine (8 - 2024-25 season) 05/02/2023   Flu Shot  12/16/2023   Medicare Annual Wellness Visit  10/25/2024   DTaP/Tdap/Td vaccine (3 - Td or Tdap) 07/13/2032   Pneumonia Vaccine  Completed   Hepatitis C Screening  Completed   Zoster (Shingles) Vaccine  Completed   HPV Vaccine  Aged Out   Meningitis B Vaccine  Aged Out   Colon Cancer Screening  Discontinued    Advanced directives: (In Chart) A copy of your advanced directives are scanned into your chart should your provider ever need it.  Advance Care Planning is important because it:  [x]  Makes sure you receive the medical care that is consistent with your values, goals, and preferences  [x]  It provides guidance to your family and loved ones and reduces their decisional burden about whether or not they are making the right decisions based on your wishes.  Follow the link provided in your after visit summary or read over the paperwork we have mailed to you to help you started getting your Advance Directives in  place. If you need assistance in completing these, please reach out to us  so that we can help you!  See attachments for Preventive Care and Fall Prevention Tips.

## 2023-10-26 NOTE — Progress Notes (Signed)
 Subjective:   Brett [name] is a 78 y.o. who presents for a Medicare Wellness preventive visit.  As a reminder, Annual Wellness Visits don't include a physical exam, and some assessments may be limited, especially if this visit is performed virtually. We may recommend an in-person follow-up visit with your provider if needed.  Visit Complete: Virtual I connected with  Brett [name] on 10/26/23 by a audio enabled telemedicine application and verified that I am speaking with the correct person using two identifiers.  Patient Location: Home  Provider Location: Home Office  I discussed the limitations of evaluation and management by telemedicine. The patient expressed understanding and agreed to proceed.  Vital Signs: Because this visit was a virtual/telehealth visit, some criteria may be missing or patient reported. Any vitals not documented were not able to be obtained and vitals that have been documented are patient reported.  VideoDeclined- This patient declined Librarian, academic. Therefore the visit was completed with audio only.  Persons Participating in Visit: Patient.  AWV Questionnaire: No: Patient Medicare AWV questionnaire was not completed prior to this visit.  Cardiac Risk Factors include: advanced age (>20men, >79 women);hypertension;male gender     Objective:     Today's Vitals   10/26/23 1223  Weight: 190 lb (86.2 kg)  Height: 5' 8 (1.727 m)   Body mass index is 28.89 kg/m.     10/26/2023   12:25 PM 06/09/2023    1:10 PM 02/26/2023    8:00 AM 02/09/2023    1:02 PM 10/07/2022    2:35 PM 10/05/2021    2:39 AM 01/21/2021    7:20 AM  Advanced Directives  Does Patient Have a Medical Advance Directive? Yes Yes Yes Yes Yes Yes Yes  Type of Estate agent of [place];Living will Healthcare Power of [place];Living will Healthcare Power of [place];Living will Healthcare Power of [place];Living will Healthcare Power of  [place];Living will Living will Healthcare Power of [place];Living will  Does patient want to make changes to medical advance directive? No - Patient declined  No - Patient declined  No - Patient declined  No - Patient declined  Copy of Healthcare Power of Attorney in Chart? Yes - validated most recent copy scanned in chart (See row information)  Yes - validated most recent copy scanned in chart (See row information)  Yes - validated most recent copy scanned in chart (See row information)  No - copy requested    Current Medications (verified) Outpatient Encounter Medications as of 10/26/2023  Medication Sig   acetaminophen  (TYLENOL ) 500 MG tablet Take 1,000 mg by mouth every 6 (six) hours as needed for moderate pain.   baclofen  (LIORESAL ) 10 MG tablet Take 1 tablet (10 mg total) by mouth at bedtime.   Bempedoic Acid-Ezetimibe  (NEXLIZET ) 180-10 MG TABS Take 1 tablet by mouth in the morning.   chlorthalidone  (HYGROTON ) 25 MG tablet Take 0.5 tablets (12.5 mg total) by mouth every morning.   colestipol  (COLESTID ) 1 g tablet Take 1 tablet (1 g total) by mouth 2 (two) times daily.   diltiazem  (CARDIZEM  CD) 180 MG 24 hr capsule Take 180 mg by mouth in the morning.   ezetimibe  (ZETIA ) 10 MG tablet Take 10 mg by mouth daily.   famotidine  (PEPCID ) 40 MG tablet Take 1 tablet (40 mg total) by mouth daily.   fenofibrate  (TRICOR ) 48 MG tablet Take 1 tablet (48 mg total) by mouth daily.   furosemide  (LASIX ) 20 MG tablet Take 1 tablet (20  mg total) by mouth daily as needed. Take for 1 lb weight gain in a day OR 3 lb weight gain in a week.   losartan  (COZAAR ) 100 MG tablet Take 100 mg by mouth every evening.   methocarbamol  (ROBAXIN ) 500 MG tablet Take 1 tablet (500 mg total) by mouth every 6 (six) hours as needed for muscle spasms.   pantoprazole  (PROTONIX ) 40 MG tablet Take 1 tablet (40 mg total) by mouth 2 (two) times daily.   tamsulosin  (FLOMAX ) 0.4 MG CAPS capsule TAKE 1 CAPSULE BY MOUTH DAILY   traZODone   (DESYREL ) 50 MG tablet TAKE 1 TABLET BY MOUTH AT  BEDTIME   No facility-administered encounter medications on file as of 10/26/2023.    Allergies (verified) 2,4-d dimethylamine; Pravastatin ; Statins; and Tramadol   History: Past Medical History:  Diagnosis Date   Allergy    seasonal   Arthritis    Cataract    Diverticulosis    GERD (gastroesophageal reflux disease)    Heart murmur    History of hiatal hernia    Hyperlipidemia    Hypertension    IBS (irritable colon syndrome)    Irregular heart beat    Pneumonia    hx. of   PONV (postoperative nausea and vomiting)    with gallbladder surgery   SCC (squamous cell carcinoma) 01/02/2019   R temple-[id number]   Sleep apnea    cpap   Vertigo    Past Surgical History:  Procedure Laterality Date   CARPAL TUNNEL RELEASE Bilateral 05/2022   CHOLECYSTECTOMY  2004   Post-cholecystectomy syndrome    COLONOSCOPY     ESOPHAGOGASTRODUODENOSCOPY     EYE SURGERY     cataract   FRACTURE SURGERY  2006   fx. wrist   JOINT REPLACEMENT     KNEE SURGERY Left 2020   LUMBAR DISC SURGERY     L4-L5 back surgery x 2   REVERSE SHOULDER ARTHROPLASTY Left 06/17/2023   Procedure: REVERSE SHOULDER ARTHROPLASTY;  Surgeon: Winston Hawking, MD;  Location: [facility];  Service: Orthopedics;  Laterality: Left;  interscalene block   rhino plasty     deviated septum   SHOULDER SURGERY  2011   rotator cuff   TOTAL HIP ARTHROPLASTY Right 12/13/2016   Procedure: RIGHT TOTAL HIP ARTHROPLASTY ANTERIOR APPROACH;  Surgeon: Adonica Hoose, MD;  Location: [facility] OR;  Service: Orthopedics;  Laterality: Right;   TOTAL SHOULDER ARTHROPLASTY Right 04/15/2017   Procedure: RIGHT TOTAL SHOULDER ARTHROPLASTY;  Surgeon: Winston Hawking, MD;  Location: [facility] OR;  Service: Orthopedics;  Laterality: Right;   TYMPANOSTOMY TUBE PLACEMENT Left 2020   UPPER GASTROINTESTINAL ENDOSCOPY     VASECTOMY  1980   Family History  Problem Relation Age of Onset   Cancer Sister        uterine     Cancer Maternal Grandmother    Colon cancer Neg Hx    Colon polyps Neg Hx    Esophageal cancer Neg Hx    Rectal cancer Neg Hx    Stomach cancer Neg Hx    Social History   Socioeconomic History   Marital status: Married    Spouse name: Not on file   Number of children: 2   Years of education: Not on file   Highest education level: Bachelor's degree (e.g., BA, AB, BS)  Occupational History   Not on file  Tobacco Use   Smoking status: Former    Current packs/day: 0.00    Average packs/day: 2.0 packs/day for 51.0 years (102.0  ttl pk-yrs)    Types: Cigarettes, Cigars    Start date: 1963    Quit date: 2012    Years since quitting: 13.4   Smokeless tobacco: Never  Vaping Use   Vaping status: Never Used  Substance and Sexual Activity   Alcohol use: Not Currently   Drug use: No   Sexual activity: Yes  Other Topics Concern   Not on file  Social History Narrative   Not on file   Social Drivers of Health   Financial Resource Strain: Low Risk  (10/26/2023)   Overall Financial Resource Strain (CARDIA)    Difficulty of Paying Living Expenses: Not hard at all  Food Insecurity: No Food Insecurity (10/26/2023)   Hunger Vital Sign    Worried About Running Out of Food in the Last Year: Never true    Ran Out of Food in the Last Year: Never true  Transportation Needs: No Transportation Needs (10/26/2023)   PRAPARE - Administrator, Civil Service (Medical): No    Lack of Transportation (Non-Medical): No  Physical Activity: Sufficiently Active (10/26/2023)   Exercise Vital Sign    Days of Exercise per Week: 5 days    Minutes of Exercise per Session: 90 min  Stress: Stress Concern Present (10/26/2023)   [employer] of Occupational Health - Occupational Stress Questionnaire    Feeling of Stress : To some extent  Social Connections: Moderately Isolated (10/26/2023)   Social Connection and Isolation Panel [NHANES]    Frequency of Communication with Friends and Family: Once a  week    Frequency of Social Gatherings with Friends and Family: Twice a week    Attends Religious Services: Never    Database administrator or Organizations: No    Attends Engineer, structural: Never    Marital Status: Married    Tobacco Counseling Counseling given: Not Answered    Clinical Intake:  Pre-visit preparation completed: Yes  Pain : No/denies pain     Diabetes: No  Lab Results  Component Value Date   HGBA1C 5.8 (H) 01/29/2021   HGBA1C 6.0 (H) 07/29/2020   HGBA1C 5.6 04/02/2019     How often do you need to have someone help you when you read instructions, pamphlets, or other written materials from your doctor or pharmacy?: 1 - Never  Interpreter Needed?: No  Information entered by :: Brett Cypress LPN   Activities of Daily Living     10/26/2023   12:24 PM 06/09/2023    1:11 PM  In your present state of health, do you have any difficulty performing the following activities:  Hearing? 0   Vision? 0   Difficulty concentrating or making decisions? 0   Walking or climbing stairs? 0   Dressing or bathing? 0   Doing errands, shopping? 0 0  Preparing Food and eating ? N   Using the Toilet? N   In the past six months, have you accidently leaked urine? N   Do you have problems with loss of bowel control? N   Managing your Medications? N   Managing your Finances? N   Housekeeping or managing your Housekeeping? N     Patient Care Team: Jenelle Mis, FNP as PCP - General (Family Medicine) Olinda Bertrand, DO as PCP - Cardiology (Cardiology) Devon Fogo, MD (Inactive) as Consulting Physician (Dermatology) Olinda Bertrand, DO as Consulting Physician (Cardiology) Audie Bleacher, MD as Consulting Physician (Neurosurgery) Ronn Cohn, MD as Consulting Physician (Orthopedic Surgery) [facility]  I have updated your Care Teams any recent Medical Services you may have received from other providers in the past year.     Assessment:     This is a routine wellness examination for Brett [name].  Hearing/Vision screen Hearing Screening - Comments:: Denies hearing difficulties   Vision Screening - Comments:: Wears rx glasses - [facility] Dream[facility] Gent)    Goals Addressed             This Visit's Progress    Remain active and independent   On track      Depression Screen     10/26/2023   12:26 PM 09/06/2023   11:45 AM 10/07/2022    2:34 PM 09/01/2022    3:08 PM 01/29/2021    8:09 AM 02/13/2020    8:02 AM 07/31/2019    8:11 AM  PHQ 2/9 Scores  PHQ - 2 Score 0 0 0 0 0 0 0  PHQ- 9 Score   0 0   0    Fall Risk     10/26/2023   12:25 PM 09/06/2023   11:45 AM 10/07/2022    2:33 PM 10/05/2022    2:50 PM 09/01/2022    3:08 PM  Fall Risk   Falls in the past year? 0 0 0 0 0  Number falls in past yr: 0 0 0  0  Injury with Fall? 0 0 0  0  Risk for fall due to : No Fall Risks No Fall Risks No Fall Risks    Follow up Falls prevention discussed;Education provided;Falls evaluation completed Falls prevention discussed;Falls evaluation completed Falls prevention discussed;Education provided;Falls evaluation completed      MEDICARE RISK AT HOME:  Medicare Risk at Home Any stairs in or around the home?: Yes If so, are there any without handrails?: No Home free of loose throw rugs in walkways, pet beds, electrical cords, etc?: Yes Adequate lighting in your home to reduce risk of falls?: Yes Life alert?: No Use of a cane, walker or w/c?: No Grab bars in the bathroom?: Yes Shower chair or bench in shower?: No Elevated toilet seat or a handicapped toilet?: Yes  TIMED UP AND GO:  Was the test performed?  No  Cognitive Function: 6CIT completed        10/26/2023   12:25 PM 10/07/2022    2:36 PM 07/29/2020    8:33 AM  6CIT Screen  What Year? 0 points 0 points 0 points  What month? 0 points 0 points 0 points  What time? 0 points 0 points 0 points  Count back from 20 0 points 0 points  0 points  Months in reverse 0 points 0 points 0 points  Repeat phrase 0 points 0 points 0 points  Total Score 0 points 0 points 0 points    Immunizations Immunization History  Administered Date(s) Administered   Fluad Quad(high Dose 65+) 02/22/2019, 02/26/2020   Fluad Trivalent(High Dose 65+) 03/07/2023   Influenza, High Dose Seasonal PF 05/04/2017, 02/21/2018, 01/29/2021   Influenza,inj,Quad PF,6+ Mos 06/18/2016   Influenza-Unspecified 04/16/2017, 02/22/2019, 02/26/2020, 02/08/2022   Moderna Covid-19 Fall Seasonal Vaccine 89yrs & older 02/08/2022   Moderna Sars-Covid-2 Vaccination 03/07/2023   PFIZER(Purple Top)SARS-COV-2 Vaccination 06/25/2019, 07/20/2019, 03/29/2020, 08/24/2020   Pfizer Covid-19 Vaccine Bivalent Booster 39yrs & up 02/02/2021   Pfizer(Comirnaty)Fall Seasonal Vaccine 12 years and older 02/08/2022   Pneumococcal Conjugate-13 10/09/2014   Pneumococcal Polysaccharide-23 10/09/2014   Respiratory Syncytial Virus Vaccine,Recomb Aduvanted(Arexvy)  05/03/2022   Rsv, Bivalent, Protein Subunit Rsvpref,pf Pattricia Bores) 02/08/2022   Td (Adult),5 Lf Tetanus Toxid, Preservative Free 07/13/2022   Tdap 12/15/2012   Zoster Recombinant(Shingrix) 02/26/2020, 05/13/2020   Zoster, Live 05/17/2010    Screening Tests Health Maintenance  Topic Date Due   Lung Cancer Screening  12/25/2016   COVID-19 Vaccine (8 - 2024-25 season) 05/02/2023   INFLUENZA VACCINE  12/16/2023   Medicare Annual Wellness (AWV)  10/25/2024   DTaP/Tdap/Td (3 - Td or Tdap) 07/13/2032   Pneumonia Vaccine 54+ Years old  Completed   Hepatitis C Screening  Completed   Zoster Vaccines- Shingrix  Completed   HPV VACCINES  Aged Out   Meningococcal B Vaccine  Aged Out   Colonoscopy  Discontinued    Health Maintenance  Health Maintenance Due  Topic Date Due   Lung Cancer Screening  12/25/2016   COVID-19 Vaccine (8 - 2024-25 season) 05/02/2023    Additional Screening:  Vision Screening: Recommended annual  ophthalmology exams for early detection of glaucoma and other disorders of the eye. Would you like a referral to an eye doctor? No    Dental Screening: Recommended annual dental exams for proper oral hygiene  Community Resource Referral / Chronic Care Management: CRR required this visit?  No   CCM required this visit?  No   Plan:    I have personally reviewed and noted the following in the patient's chart:   Medical and social history Use of alcohol, tobacco or illicit drugs  Current medications and supplements including opioid prescriptions. Patient is not currently taking opioid prescriptions. Functional ability and status Nutritional status Physical activity Advanced directives List of other physicians Hospitalizations, surgeries, and ER visits in previous 12 months Vitals Screenings to include cognitive, depression, and falls Referrals and appointments  In addition, I have reviewed and discussed with patient certain preventive protocols, quality metrics, and best practice recommendations. A written personalized care plan for preventive services as well as general preventive health recommendations were provided to patient.   Brett [name], [place]   08/23/8117   After Visit Summary: (MyChart) Due to this being a telephonic visit, the after visit summary with patients personalized plan was offered to patient via MyChart   Notes: Nothing significant to report at this time.

## 2023-10-29 ENCOUNTER — Ambulatory Visit: Payer: Self-pay | Admitting: Cardiology

## 2023-10-30 ENCOUNTER — Other Ambulatory Visit: Payer: Self-pay | Admitting: Cardiology

## 2023-11-01 ENCOUNTER — Other Ambulatory Visit: Payer: Self-pay | Admitting: Neurosurgery

## 2023-11-01 ENCOUNTER — Ambulatory Visit

## 2023-11-01 DIAGNOSIS — M47816 Spondylosis without myelopathy or radiculopathy, lumbar region: Secondary | ICD-10-CM

## 2023-11-01 DIAGNOSIS — M545 Low back pain, unspecified: Secondary | ICD-10-CM | POA: Diagnosis not present

## 2023-12-07 ENCOUNTER — Encounter: Payer: Self-pay | Admitting: Emergency Medicine

## 2024-01-05 ENCOUNTER — Other Ambulatory Visit: Payer: Self-pay | Admitting: Family Medicine

## 2024-01-05 DIAGNOSIS — K219 Gastro-esophageal reflux disease without esophagitis: Secondary | ICD-10-CM

## 2024-01-09 ENCOUNTER — Encounter: Payer: Self-pay | Admitting: Family Medicine

## 2024-01-10 ENCOUNTER — Telehealth: Payer: Self-pay

## 2024-01-10 NOTE — Telephone Encounter (Signed)
 Pt is requesting to have a referral sent sent to Dr. Guss from Dr. Perla.

## 2024-01-12 ENCOUNTER — Other Ambulatory Visit: Payer: Self-pay

## 2024-01-12 DIAGNOSIS — I700 Atherosclerosis of aorta: Secondary | ICD-10-CM

## 2024-01-12 NOTE — Telephone Encounter (Signed)
 Referral placed to [facility] for Dr . Benishmone

## 2024-01-18 ENCOUNTER — Other Ambulatory Visit: Payer: Self-pay | Admitting: Cardiology

## 2024-01-25 ENCOUNTER — Telehealth: Payer: Self-pay | Admitting: Pharmacy Technician

## 2024-01-25 NOTE — Telephone Encounter (Signed)
  Pharmacy Patient Advocate Encounter   Received notification from CoverMyMeds that prior authorization for nexlizet  is required/requested.   Insurance verification completed.   The patient is insured through [facility] .   Per test claim: PA required; PA submitted to above mentioned insurance via Latent Key/confirmation #/EOC [id number] Status is pending

## 2024-01-26 ENCOUNTER — Other Ambulatory Visit: Payer: Self-pay | Admitting: Family Medicine

## 2024-01-26 DIAGNOSIS — K219 Gastro-esophageal reflux disease without esophagitis: Secondary | ICD-10-CM

## 2024-01-26 NOTE — Telephone Encounter (Signed)
 Pharmacy Patient Advocate Encounter  Received notification from [employer] that Prior Authorization for nexlizet  has been APPROVED from 01/26/24 to 01/24/25   PA #/Case ID/Reference #: [id number]

## 2024-02-20 ENCOUNTER — Encounter: Payer: Self-pay | Admitting: Cardiology

## 2024-02-24 ENCOUNTER — Ambulatory Visit: Attending: Cardiology | Admitting: Cardiology

## 2024-02-24 ENCOUNTER — Encounter: Payer: Self-pay | Admitting: Cardiology

## 2024-02-24 ENCOUNTER — Ambulatory Visit (INDEPENDENT_AMBULATORY_CARE_PROVIDER_SITE_OTHER)

## 2024-02-24 VITALS — BP 125/61 | HR 54 | Ht 68.0 in | Wt 190.0 lb

## 2024-02-24 DIAGNOSIS — Z789 Other specified health status: Secondary | ICD-10-CM

## 2024-02-24 DIAGNOSIS — I340 Nonrheumatic mitral (valve) insufficiency: Secondary | ICD-10-CM

## 2024-02-24 DIAGNOSIS — I493 Ventricular premature depolarization: Secondary | ICD-10-CM

## 2024-02-24 DIAGNOSIS — I2510 Atherosclerotic heart disease of native coronary artery without angina pectoris: Secondary | ICD-10-CM

## 2024-02-24 DIAGNOSIS — I700 Atherosclerosis of aorta: Secondary | ICD-10-CM

## 2024-02-24 DIAGNOSIS — I10 Essential (primary) hypertension: Secondary | ICD-10-CM

## 2024-02-24 DIAGNOSIS — I2584 Coronary atherosclerosis due to calcified coronary lesion: Secondary | ICD-10-CM

## 2024-02-24 DIAGNOSIS — R001 Bradycardia, unspecified: Secondary | ICD-10-CM

## 2024-02-24 DIAGNOSIS — E7800 Pure hypercholesterolemia, unspecified: Secondary | ICD-10-CM

## 2024-02-24 DIAGNOSIS — E781 Pure hyperglyceridemia: Secondary | ICD-10-CM

## 2024-02-24 MED ORDER — HYDROCHLOROTHIAZIDE 12.5 MG PO CAPS: 12.5000 mg | ORAL_CAPSULE | Freq: Every day | ORAL | 0 refills | Status: DC

## 2024-02-24 NOTE — Progress Notes (Unsigned)
Applied a 3 day Zio XT monitor to patient in the office 

## 2024-02-24 NOTE — Progress Notes (Signed)
 Cardiology Office Note:  .    ID:  Brett KUZEL, DOB 11-23-1945, MRN [id number] PCP:  Kayla Jeoffrey RAMAN, FNP  Former Cardiology Providers: NA [facility] Providers Cardiologist:  Madonna Large, DO , [facility] (established care 12/23/2021) Electrophysiologist:  None  Click to update primary MD,subspecialty MD or APP then REFRESH:1}    Chief Complaint  Patient presents with   Follow-up    Low pulse, lightheadedness, dizziness    History of Present Illness: .   Brett [name] is a 78 y.o. Caucasian male whose past medical history and cardiovascular risk factors includes: Mitral regurgitation, hyperlipidemia, statin intolerance, GERD, hypertension, hiatal hernia, iron deficiency anemia, former smoker, hypertriglyceridemia, OSA and CPAP.   Patient is currently being followed at the practice given his mitral regurgitation, coronary artery calcification, and PVCs.  In January 2025 patient saw Jackee Alberts nurse practitioner at that time he was endorsing feeling short of breath, dizzy, swelling, 15 pounds of weight gain since October 2024.  He underwent additional blood work as outlined below.  Echocardiogram was performed which noted preserved LVEF, grade 1 diastolic dysfunction and overall improvement in his valvular heart disease.  Myocardial perfusion imaging was also performed to rule out ischemia and the study was reported to be low risk.  In February 2025 patient had gone to his PCP at the [facility] and was noted to have soft blood pressures.  Now he was endorsing episodes of dizzy and dehydrated and he was recommended  to reduce chlorthalidone  to 12.5 mg p.o. daily.  During his last visit in March 2025 patient was recommended to take Lasix  on as-needed basis to avoid dehydration.  He was started on Nexlizet  for lipid management.  He presents today for 55-month follow-up visit.  He is accompanied by his wife for today's visit.  Over the last 1 month has been experiencing lightheaded/dizzy,  shortness of breath, and noticing a lower pulse rate.  His pulse is usually between 50-60 bpm. He denies any near-syncope or syncopal events.  At times he feels that he is dehydrated.  He is currently on chlorthalidone  12.5 mg p.o. daily and states it is difficult to cut in half.  He also uses Lasix  on as needed basis.   Review of Systems: .   Review of Systems  Cardiovascular:  Negative for chest pain, claudication, irregular heartbeat, leg swelling, near-syncope, orthopnea, palpitations, paroxysmal nocturnal dyspnea and syncope.  Respiratory:  Negative for shortness of breath.   Hematologic/Lymphatic: Negative for bleeding problem.    Studies Reviewed:    Echocardiogram: 01/29/2022: LVEF 60 to 65%, grade 1 diastolic dysfunction, normal LAP, moderate MR, mild TR, see report  05/26/2023:  1. Left ventricular ejection fraction, by estimation, is 60 to 65%. The  left ventricle has normal function. The left ventricle has no regional  wall motion abnormalities. Left ventricular diastolic parameters are  consistent with Grade I diastolic  dysfunction (impaired relaxation).   2. Right ventricular systolic function is normal. The right ventricular  size is normal. There is normal pulmonary artery systolic pressure. The  estimated right ventricular systolic pressure is 27.4 mmHg.   3. The mitral valve is degenerative. Trivial mitral valve regurgitation.  No evidence of mitral stenosis.   4. The aortic valve is tricuspid. Aortic valve regurgitation is trivial.  Aortic valve sclerosis/calcification is present, without any evidence of  aortic stenosis. Aortic regurgitation PHT measures 694 msec.   5. Aortic dilatation noted. There is mild dilatation of the aortic root,  measuring 39  mm.   6. The inferior vena cava is normal in size with greater than 50%  respiratory variability, suggesting right atrial pressure of 3 mmHg.    Stress Testing: MPI 05/2023   LV perfusion is normal. There is no  evidence of ischemia. There is no evidence of infarction.   Left ventricular function is normal. Nuclear stress EF: 62%. The left ventricular ejection fraction is normal (55-65%). End diastolic cavity size is normal.   Averge exercise capacity (6:36 min:s; 7.2 METS). Normal HR/BP response to exercise.   The study is normal. The study is low risk.   CT Cardiac Scoring: October 22, 2022 1. Coronary calcium score of 56. This was 25th percentile for age,gender, and race matched controls.  2. Aortic atherosclerosis.  Noncardiac findings: No significant extracardiac findings within the visualized chest.    Zio patch April 2024   Predominant rhythm was normal sinus rhythm with average heart rate 66bpm and ranged from 47 to 141bpm.   Rare PCAs, atrial couplets and atrial triplets   Occasional PVCs (PVC load 3.8%).  rare ventricular couplets, ventricular bigeminy and trigeminy   Nonsustained atrial tachycardia up to 11.4 seconds with fastest heart rate 190bpm  RADIOLOGY: NA  Risk Assessment/Calculations:   N/A   Labs:       Latest Ref Rng & Units 05/24/2023    3:53 PM 03/07/2023    8:42 AM 02/25/2023    6:01 PM  CBC  WBC 3.4 - 10.8 x10E3/uL 8.9  9.3  12.7   Hemoglobin 13.0 - 17.7 g/dL 87.0  87.6  87.8   Hematocrit 37.5 - 51.0 % 39.6  38.2  36.1   Platelets 150 - 450 x10E3/uL 341  406  308        Latest Ref Rng & Units 10/25/2023   11:15 AM 05/24/2023    3:53 PM 03/07/2023    8:42 AM  BMP  Glucose 70 - 99 mg/dL 865  84  895   BUN 8 - 27 mg/dL 12  15  14    Creatinine 0.76 - 1.27 mg/dL 8.91  8.91  9.05   BUN/Creat Ratio 10 - 24 11  14   SEE NOTE:   Sodium 134 - 144 mmol/L 138  139  136   Potassium 3.5 - 5.2 mmol/L 3.8  3.9  3.8   Chloride 96 - 106 mmol/L 101  98  94   CO2 20 - 29 mmol/L 21  26  33   Calcium 8.6 - 10.2 mg/dL 9.1  9.2  9.3       Latest Ref Rng & Units 10/25/2023   11:15 AM 05/24/2023    3:53 PM 03/07/2023    8:42 AM  CMP  Glucose 70 - 99 mg/dL 865  84  895   BUN  8 - 27 mg/dL 12  15  14    Creatinine 0.76 - 1.27 mg/dL 8.91  8.91  9.05   Sodium 134 - 144 mmol/L 138  139  136   Potassium 3.5 - 5.2 mmol/L 3.8  3.9  3.8   Chloride 96 - 106 mmol/L 101  98  94   CO2 20 - 29 mmol/L 21  26  33   Calcium 8.6 - 10.2 mg/dL 9.1  9.2  9.3   Total Protein 6.0 - 8.5 g/dL 6.6   6.5   Total Bilirubin 0.0 - 1.2 mg/dL 0.4   0.4   Alkaline Phos 44 - 121 IU/L 45     AST 0 -  40 IU/L 15   9   ALT 0 - 44 IU/L 13   15     Lab Results  Component Value Date   CHOL 142 10/25/2023   HDL 37 (L) 10/25/2023   LDLCALC 74 10/25/2023   LDLDIRECT 68 10/25/2023   TRIG 183 (H) 10/25/2023   CHOLHDL 3.8 10/25/2023   No results for input(s): LIPOA in the last 8760 hours. No components found for: NTPROBNP Recent Labs    05/24/23 1553  PROBNP <36   No results for input(s): TSH in the last 8760 hours.  External Labs: Collected: 03/07/2023 [facility] database. Total cholesterol 188, triglycerides 147, HDL 43, LDL 119. Hemoglobin 12.3 g/dL  Physical Exam:    Today's Vitals   02/24/24 0938  BP: 125/61  Pulse: (!) 54  SpO2: 96%  Weight: 190 lb (86.2 kg)  Height: 5' 8 (1.727 m)   Body mass index is 28.89 kg/m. Wt Readings from Last 3 Encounters:  02/24/24 190 lb (86.2 kg)  10/26/23 190 lb (86.2 kg)  10/05/23 190 lb 6.4 oz (86.4 kg)    Physical Exam  Constitutional: No distress.  Age appropriate, hemodynamically stable.   HENT:  Frank-sign  Neck: No JVD present.  Cardiovascular: Normal rate, regular rhythm, S1 normal, S2 normal, intact distal pulses and normal pulses. Exam reveals no gallop, no S3 and no S4.  Murmur heard. Holosystolic murmur is present with a grade of 3/6 at the apex. Pulses:      Dorsalis pedis pulses are 2+ on the right side and 2+ on the left side.       Posterior tibial pulses are 2+ on the right side and 2+ on the left side.  Pulmonary/Chest: Effort normal and breath sounds normal. No stridor. He has no wheezes. He has no rales.   Abdominal: Soft. Bowel sounds are normal. He exhibits no distension. There is no abdominal tenderness.  Musculoskeletal:        General: No edema.     Cervical back: Neck supple.  Neurological: He is alert and oriented to person, place, and time. He has intact cranial nerves (2-12).  Skin: Skin is warm and moist.     Impression & Recommendation(s):  Impression:   ICD-10-CM   1. Bradycardia  R00.1 LONG TERM MONITOR (3-14 DAYS)    2. Premature ventricular complex  I49.3 LONG TERM MONITOR (3-14 DAYS)    3. Nonrheumatic mitral valve regurgitation  I34.0     4. Essential (primary) hypertension  I10 hydrochlorothiazide (MICROZIDE) 12.5 MG capsule    5. Coronary atherosclerosis due to calcified coronary lesion  I25.10    I25.84     6. Aortic atherosclerosis  I70.0     7. Pure hypercholesterolemia  E78.00     8. Hypertriglyceridemia  E78.1     9. Statin intolerance  Z78.9      Recommendation(s):  Bradycardia Premature ventricular complex Endorses lightheaded, dizziness, shortness of breath and low pulse rate. Pulse ranges between 50-60 bpm. Denies any near-syncope or syncopal events. His lower pulse rate could be secondary to his underlying PVCs. Will do a 3-day monitor to evaluate for PVC burden and conduction disease.  Essential (primary) hypertension Office blood pressures are very well-controlled. Given symptoms of lightheaded dizziness we will titrate his blood pressure medications downward. Discontinue chlorthalidone . Start hydrochlorothiazide 12.5 mg p.o. every morning. BMP in one week to check renal function and electrolytes Continue to use Lasix  on a as needed basis.  Coronary atherosclerosis due to calcified coronary lesion  Aortic atherosclerosis Denies anginal chest pain or heart failure symptoms. Has undergone appropriate ischemic workup as outlined above. No additional testing warranted at this time  Pure hypercholesterolemia Hypertriglyceridemia Statin  intolerance Continue Nexlizet  180/10 mg p.o. daily Discontinue Zetia . Continue fenofibrate  48 mg p.o. daily LDL 74 mg/dL, triglycerides of 93 mg/dL, October 25, 2023.   Orders Placed:  Orders Placed This Encounter  Procedures   LONG TERM MONITOR (3-14 DAYS)    Standing Status:   Future    Number of Occurrences:   1    Expiration Date:   02/23/2025    Where should this test be performed?:   [facility]    Does the patient have an implanted cardiac device?:   No    Prescribed days of wear:   3    Type of enrollment:   Clinic Enrollment    Reason for Exam:   PVC's I49.3, 147.10    Final Medication List:    Meds ordered this encounter  Medications   hydrochlorothiazide (MICROZIDE) 12.5 MG capsule    Sig: Take 1 capsule (12.5 mg total) by mouth daily.    Dispense:  30 capsule    Refill:  0    Medications Discontinued During This Encounter  Medication Reason   chlorthalidone  (HYGROTON ) 25 MG tablet Discontinued by provider   ezetimibe  (ZETIA ) 10 MG tablet       Current Outpatient Medications:    acetaminophen  (TYLENOL ) 500 MG tablet, Take 1,000 mg by mouth every 6 (six) hours as needed for moderate pain., Disp: , Rfl:    baclofen  (LIORESAL ) 10 MG tablet, Take 1 tablet (10 mg total) by mouth at bedtime., Disp: 90 each, Rfl: 3   Bempedoic Acid-Ezetimibe  (NEXLIZET ) 180-10 MG TABS, Take 1 tablet by mouth in the morning., Disp: 90 tablet, Rfl: 3   colestipol  (COLESTID ) 1 g tablet, TAKE 1 TABLET BY MOUTH TWICE  DAILY, Disp: 180 tablet, Rfl: 3   diltiazem  (CARDIZEM  CD) 180 MG 24 hr capsule, TAKE 1 CAPSULE BY MOUTH EVERY  MORNING, Disp: 90 capsule, Rfl: 2   famotidine  (PEPCID ) 40 MG tablet, TAKE 1 TABLET BY MOUTH DAILY, Disp: 90 tablet, Rfl: 3   fenofibrate  (TRICOR ) 48 MG tablet, Take 1 tablet (48 mg total) by mouth daily., Disp: 90 tablet, Rfl: 3   furosemide  (LASIX ) 20 MG tablet, Take 1 tablet (20 mg total) by mouth daily as needed. Take for 1 lb weight gain in a day OR 3 lb weight  gain in a week., Disp: , Rfl:    hydrochlorothiazide (MICROZIDE) 12.5 MG capsule, Take 1 capsule (12.5 mg total) by mouth daily., Disp: 30 capsule, Rfl: 0   losartan  (COZAAR ) 100 MG tablet, TAKE 1 TABLET BY MOUTH IN THE  EVENING, Disp: 90 tablet, Rfl: 1   methocarbamol  (ROBAXIN ) 500 MG tablet, Take 1 tablet (500 mg total) by mouth every 6 (six) hours as needed for muscle spasms., Disp: 60 tablet, Rfl: 1   pantoprazole  (PROTONIX ) 40 MG tablet, Take 1 tablet (40 mg total) by mouth 2 (two) times daily., Disp: 180 tablet, Rfl: 3   tamsulosin  (FLOMAX ) 0.4 MG CAPS capsule, TAKE 1 CAPSULE BY MOUTH DAILY, Disp: 90 capsule, Rfl: 3   traZODone  (DESYREL ) 50 MG tablet, TAKE 1 TABLET BY MOUTH AT  BEDTIME, Disp: 30 tablet, Rfl: 11  Consent:   NA  Disposition:   75-month follow-up sooner if needed  His questions and concerns were addressed to his satisfaction. He voices understanding of the recommendations provided during this encounter.  Signed, Madonna Michele HAS, [facility]  A Division of [facility] [address] [place], [place] [address]  02/25/2024 6:24 PM

## 2024-02-24 NOTE — Patient Instructions (Signed)
 Medication Instructions:  STOP use of of chlorthalidone   START hydrochlorothiazide 12.5 MG daily   CHANGE Lasix  to as needed daily   *If you need a refill on your cardiac medications before your next appointment, please call your pharmacy*  Testing/Procedures: 3 day ZIO monitor  Your physician has requested that you wear a Zio heart monitor for ___3__ days. This will be mailed to your home with instructions on how to apply the monitor and how to return it when finished. Please allow 2 weeks after returning the heart monitor before our office calls you with the results.   Follow-Up: At [facility], you and your health needs are our priority.  As part of our continuing mission to provide you with exceptional heart care, our providers are all part of one team.  This team includes your primary Cardiologist (physician) and Advanced Practice Providers or APPs (Physician Assistants and Nurse Practitioners) who all work together to provide you with the care you need, when you need it.  Your next appointment:   6 month(s)  Provider:   Madonna Large, DO    We recommend signing up for the patient portal called MyChart.  Sign up information is provided on this After Visit Summary.  MyChart is used to connect with patients for Virtual Visits (Telemedicine).  Patients are able to view lab/test results, encounter notes, upcoming appointments, etc.  Non-urgent messages can be sent to your provider as well.   To learn more about what you can do with MyChart, go to [email or web address].   Other Instructions ZIO XT- Long Term Monitor Instructions  Your physician has requested you wear a ZIO patch monitor for __3__ days.   This is a single patch monitor. [employer] supplies one patch monitor per enrollment. Additional  stickers are not available. Please do not apply patch if you will be having a Nuclear Stress Test,  Echocardiogram, Cardiac CT, MRI, or Chest Xray during the period you would be  wearing the  monitor. The patch cannot be worn during these tests. You cannot remove and re-apply the  ZIO XT patch monitor.   Your ZIO patch monitor will be mailed 3 day USPS to your address on file. It may take 3-5 days  to receive your monitor after you have been enrolled.   Once you have received your monitor, please review the enclosed instructions. Your monitor  has already been registered assigning a specific monitor serial # to you.     Billing and Patient Assistance Program Information  We have supplied [employer] with any of your insurance information on file for billing purposes.  [employer] offers a sliding scale Patient Assistance Program for patients that do not have  insurance, or whose insurance does not completely cover the cost of the ZIO monitor.  You must apply for the Patient Assistance Program to qualify for this discounted rate.   To apply, please call [employer] at [phone], select option 4, select option 2, ask to apply for  Patient Assistance Program. Meredeth will ask your household income, and how many people  are in your household. They will quote your out-of-pocket cost based on that information.  [employer] will also be able to set up a 79-month, interest-free payment plan if needed.     Applying the monitor  Shave hair from upper left chest.  Hold abrader disc by orange tab. Rub abrader in 40 strokes over the upper left chest as  indicated in your monitor instructions.  Clean area with 4 enclosed alcohol  pads. Let dry.  Apply patch as indicated in monitor instructions. Patch will be placed under collarbone on left  side of chest with arrow pointing upward.  Rub patch adhesive wings for 2 minutes. Remove white label marked 1. Remove the white  label marked 2. Rub patch adhesive wings for 2 additional minutes.  While looking in a mirror, press and release button in center of patch. A small green light will  flash 3-4 times. This will be your only indicator that  the monitor has been turned on.  Do not shower for the first 24 hours. You may shower after the first 24 hours.  Press the button if you feel a symptom. You will hear a small click. Record Date, Time and  Symptom in the Patient Logbook.  When you are ready to remove the patch, follow instructions on the last 2 pages of Patient  Logbook. Stick patch monitor onto the last page of Patient Logbook.   Place Patient Logbook in the blue and white box. Use locking tab on box and tape box closed  securely. The blue and white box has prepaid postage on it. Please place it in the mailbox as  soon as possible. Your physician should have your test results approximately 7 days after the  monitor has been mailed back to [facility].   Call [facility] Customer Care at [phone] if you have questions regarding  your ZIO XT patch monitor. Call them immediately if you see an orange light blinking on your  monitor.   If your monitor falls off in less than 4 days, contact our Monitor department at [phone].   If your monitor becomes loose or falls off after 4 days call [employer] at [phone] for  suggestions on securing your monitor.

## 2024-02-25 ENCOUNTER — Encounter: Payer: Self-pay | Admitting: Cardiology

## 2024-02-25 NOTE — Telephone Encounter (Signed)
 Continue Lasix  on as needed basis. Continue with the transition from chlorthalidone  to hydrochlorothiazide. Discontinue Zetia .  Dr. Niclas Markell

## 2024-03-02 ENCOUNTER — Other Ambulatory Visit

## 2024-03-02 DIAGNOSIS — E782 Mixed hyperlipidemia: Secondary | ICD-10-CM

## 2024-03-02 DIAGNOSIS — R7303 Prediabetes: Secondary | ICD-10-CM

## 2024-03-02 DIAGNOSIS — I10 Essential (primary) hypertension: Secondary | ICD-10-CM

## 2024-03-07 ENCOUNTER — Encounter: Payer: Self-pay | Admitting: Cardiology

## 2024-03-07 ENCOUNTER — Ambulatory Visit: Admitting: Family Medicine

## 2024-03-07 ENCOUNTER — Encounter: Payer: Self-pay | Admitting: Family Medicine

## 2024-03-07 VITALS — BP 138/74 | HR 83 | Temp 97.6°F | Ht 68.0 in | Wt 188.0 lb

## 2024-03-07 DIAGNOSIS — I700 Atherosclerosis of aorta: Secondary | ICD-10-CM

## 2024-03-07 DIAGNOSIS — G4733 Obstructive sleep apnea (adult) (pediatric): Secondary | ICD-10-CM

## 2024-03-07 DIAGNOSIS — R7303 Prediabetes: Secondary | ICD-10-CM

## 2024-03-07 DIAGNOSIS — I10 Essential (primary) hypertension: Secondary | ICD-10-CM | POA: Diagnosis not present

## 2024-03-07 DIAGNOSIS — K219 Gastro-esophageal reflux disease without esophagitis: Secondary | ICD-10-CM

## 2024-03-07 DIAGNOSIS — N401 Enlarged prostate with lower urinary tract symptoms: Secondary | ICD-10-CM

## 2024-03-07 DIAGNOSIS — E782 Mixed hyperlipidemia: Secondary | ICD-10-CM

## 2024-03-07 DIAGNOSIS — R3914 Feeling of incomplete bladder emptying: Secondary | ICD-10-CM

## 2024-03-07 NOTE — Assessment & Plan Note (Signed)
 denies chest pain, HTN, HLD, BG well controlled

## 2024-03-07 NOTE — Assessment & Plan Note (Signed)
 On CPAP, consider Zepbound

## 2024-03-07 NOTE — Assessment & Plan Note (Signed)
Add-on A1c 

## 2024-03-07 NOTE — Assessment & Plan Note (Signed)
 Followed by cardiology, continue fenofibrate  48 mg daily, Nexlizent 180/10mg  daily   I recommend consuming a heart healthy diet such as Mediterranean diet or DASH diet with whole grains, fruits, vegetable, fish, lean meats, nuts, and olive oil. Limit sweets and processed foods. I also encourage moderate intensity exercise 150 minutes weekly. This is 3-5 times weekly for 30-50 minutes each session. Goal should be pace of 3 miles/hours, or walking 1.5 miles in 30 minutes. The 10-year ASCVD risk score (Arnett DK, et al., 2019) is: 35.7%

## 2024-03-07 NOTE — Assessment & Plan Note (Signed)
 Chronic. Well controlled. Followed by cardiology. Continue Diltiazem  180mg  daily, Losartan  100mg  daily, hctz 12.5mg  daily, Lasix  PRN. Recommend heart healthy diet such as Mediterranean diet with whole grains, fruits, vegetable, fish, lean meats, nuts, and olive oil. Limit salt. Encouraged moderate walking, 3-5 times/week for 30-50 minutes each session. Aim for at least 150 minutes.week. Goal should be pace of 3 miles/hours, or walking 1.5 miles in 30 minutes. Avoid tobacco products. Avoid excess alcohol. Take medications as prescribed and bring medications and blood pressure log with cuff to each office visit. Seek medical care for chest pain, palpitations, shortness of breath with exertion, dizziness/lightheadedness, vision changes, recurrent headaches, or swelling of extremities. Follow up in 6 months.

## 2024-03-07 NOTE — Assessment & Plan Note (Signed)
 Well controlled on Tamsulosin 0.4mg  daily

## 2024-03-07 NOTE — Assessment & Plan Note (Signed)
 Protonix  40mg  BID, famotidine  40 nightly PRN, Baclofen  10mg  nightly, well controlled, gastritis, benign polyps, 1cm hiatal hernia on EGD

## 2024-03-07 NOTE — Progress Notes (Signed)
 Subjective:  HPI: Brett [name] is a 78 y.o. male presenting on 03/07/2024 for Medical Management of Chronic Issues (6 mo f/u )   HPI Patient is in today for chronic condition management. PMH includes mitral regurgitation, HLD, GERD, HTN, aortic athersclerosis, IBS, SCC, IDA, OSA on CPAP. Creatinine elevated on recent labs, recently switched from chlorthalidone  to hydrochlorothiazide. Encouraged to hydrate and recheck next week with UA. Adding on iron studies and A1c   Mitral Regurgitation: asymptomatic, mild on ECHO   HLD: LDL not at goal, taking fenofibrate  48 mg daily, Nexlizent 180/10mg  daily then lab recheck with cardiology, will send results Lipid Panel     Component Value Date/Time   CHOL 141 03/02/2024 0917   CHOL 142 10/25/2023 1115   TRIG 222 (H) 03/02/2024 0917   HDL 35 (L) 03/02/2024 0917   HDL 37 (L) 10/25/2023 1115   CHOLHDL 4.0 03/02/2024 0917   VLDL 29 11/01/2016 0820   LDLCALC 75 03/02/2024 0917   LDLDIRECT 68 10/25/2023 1115   LABVLDL 31 10/25/2023 1115    GERD: Protonix  40mg  BID, famotidine  40 nightly PRN, Baclofen  10mg  nightly, well controlled, gastritis, benign polyps, 1cm hiatal hernia on EGD Denies hematemesis, melena, hematochezia, occult blood in stool, iron deficiency anemia, anorexia, unexplained weight loss, dysphagia, odynophagia, persistent vomiting   HTN: well controlled on hydrochlorothiazide 12.5mg  daily, diltiazem  180mg  daily, and cozaar  100mg  daily. Lasix  PRN for weight gain of 1 pound per day or 3 pounds per week Denies chest pain, palpitations, recurrent headaches, vision changes, lightheadedness, dizziness, dyspnea on exertion, or swelling of extremities.  Former smoker: CT 12/26/15 with pulmonary nodules bilaterally   Atherosclerosis: denies chest pain, HTN, HLD, BG well controlled  OSA: on CPAP  IBS: on Colestipol  1g BID, PRN Loperamide  BPH: nocturia controlled tamsulosin  0.4mg  daily  IDA: not taking iron Hemoglobin & Hematocrit      Component Value Date/Time   HGB 12.2 (L) 03/02/2024 0917   HGB 12.9 (L) 05/24/2023 1553   HCT 37.3 (L) 03/02/2024 0917   HCT 39.6 05/24/2023 1553      Review of Systems  All other systems reviewed and are negative.   Relevant past medical history reviewed and updated as indicated.   Past Medical History:  Diagnosis Date   Allergy    seasonal   Arthritis    Cataract    Diverticulosis    GERD (gastroesophageal reflux disease)    Heart murmur    History of hiatal hernia    Hyperlipidemia    Hypertension    IBS (irritable colon syndrome)    Irregular heart beat    Pneumonia    hx. of   PONV (postoperative nausea and vomiting)    with gallbladder surgery   SCC (squamous cell carcinoma) 01/02/2019   R [id number]   Sleep apnea    cpap   Vertigo      Past Surgical History:  Procedure Laterality Date   CARPAL TUNNEL RELEASE Bilateral 05/2022   CHOLECYSTECTOMY  2004   Post-cholecystectomy syndrome    COLONOSCOPY     ESOPHAGOGASTRODUODENOSCOPY     EYE SURGERY     cataract   FRACTURE SURGERY  2006   fx. wrist   JOINT REPLACEMENT     KNEE SURGERY Left 2020   LUMBAR DISC SURGERY     L4-L5 back surgery x 2   REVERSE SHOULDER ARTHROPLASTY Left 06/17/2023   Procedure: REVERSE SHOULDER ARTHROPLASTY;  Surgeon: Kay Kemps, MD;  Location: [facility];  Service: Orthopedics;  Laterality: Left;  interscalene block   rhino plasty     deviated septum   SHOULDER SURGERY  2011   rotator cuff   TOTAL HIP ARTHROPLASTY Right 12/13/2016   Procedure: RIGHT TOTAL HIP ARTHROPLASTY ANTERIOR APPROACH;  Surgeon: Fidel Rogue, MD;  Location: [facility] OR;  Service: Orthopedics;  Laterality: Right;   TOTAL SHOULDER ARTHROPLASTY Right 04/15/2017   Procedure: RIGHT TOTAL SHOULDER ARTHROPLASTY;  Surgeon: Kay Kemps, MD;  [facility];  Service: Orthopedics;  Laterality: Right;   TYMPANOSTOMY TUBE PLACEMENT Left 2020   UPPER GASTROINTESTINAL ENDOSCOPY     VASECTOMY  1980     Allergies and medications reviewed and updated.   Current Outpatient Medications:    acetaminophen  (TYLENOL ) 500 MG tablet, Take 1,000 mg by mouth every 6 (six) hours as needed for moderate pain., Disp: , Rfl:    baclofen  (LIORESAL ) 10 MG tablet, Take 1 tablet (10 mg total) by mouth at bedtime., Disp: 90 each, Rfl: 3   Bempedoic Acid-Ezetimibe  (NEXLIZET ) 180-10 MG TABS, Take 1 tablet by mouth in the morning., Disp: 90 tablet, Rfl: 3   colestipol  (COLESTID ) 1 g tablet, TAKE 1 TABLET BY MOUTH TWICE  DAILY, Disp: 180 tablet, Rfl: 3   diltiazem  (CARDIZEM  CD) 180 MG 24 hr capsule, TAKE 1 CAPSULE BY MOUTH EVERY  MORNING, Disp: 90 capsule, Rfl: 2   fenofibrate  (TRICOR ) 48 MG tablet, Take 1 tablet (48 mg total) by mouth daily., Disp: 90 tablet, Rfl: 3   hydrochlorothiazide (MICROZIDE) 12.5 MG capsule, Take 1 capsule (12.5 mg total) by mouth daily., Disp: 30 capsule, Rfl: 0   losartan  (COZAAR ) 100 MG tablet, TAKE 1 TABLET BY MOUTH IN THE  EVENING, Disp: 90 tablet, Rfl: 1   pantoprazole  (PROTONIX ) 40 MG tablet, Take 1 tablet (40 mg total) by mouth 2 (two) times daily., Disp: 180 tablet, Rfl: 3   tamsulosin  (FLOMAX ) 0.4 MG CAPS capsule, TAKE 1 CAPSULE BY MOUTH DAILY, Disp: 90 capsule, Rfl: 3   traZODone  (DESYREL ) 50 MG tablet, TAKE 1 TABLET BY MOUTH AT  BEDTIME, Disp: 30 tablet, Rfl: 11   famotidine  (PEPCID ) 40 MG tablet, TAKE 1 TABLET BY MOUTH DAILY, Disp: 90 tablet, Rfl: 3   furosemide  (LASIX ) 20 MG tablet, Take 1 tablet (20 mg total) by mouth daily as needed. Take for 1 lb weight gain in a day OR 3 lb weight gain in a week., Disp: , Rfl:   Allergies  Allergen Reactions   2,4-D Dimethylamine Other (See Comments)   Pravastatin  Other (See Comments)   Statins     myalgias   Tramadol Nausea Only    Objective:   BP 138/74   Pulse 83   Temp 97.6 F (36.4 C)   Ht 5' 8 (1.727 m)   Wt 188 lb (85.3 kg)   SpO2 97%   BMI 28.59 kg/m      03/07/2024    8:00 AM 02/24/2024    9:38 AM 10/26/2023    12:23 PM  Vitals with BMI  Height 5' 8 5' 8 5' 8  Weight 188 lbs 190 lbs 190 lbs  BMI 28.59 28.9 28.9  Systolic 138 125 --  Diastolic 74 61 --  Pulse 83 54      Physical Exam Vitals and nursing note reviewed.  Constitutional:      Appearance: Normal appearance. He is normal weight.  HENT:     Head: Normocephalic and atraumatic.  Cardiovascular:     Rate and Rhythm: Normal rate and regular rhythm.  Pulses: Normal pulses.     Heart sounds: Normal heart sounds.  Pulmonary:     Effort: Pulmonary effort is normal.     Breath sounds: Normal breath sounds.  Skin:    General: Skin is warm and dry.     Capillary Refill: Capillary refill takes less than 2 seconds.  Neurological:     General: No focal deficit present.     Mental Status: He is alert and oriented to person, place, and time. Mental status is at baseline.  Psychiatric:        Mood and Affect: Mood normal.        Behavior: Behavior normal.        Thought Content: Thought content normal.        Judgment: Judgment normal.     Assessment & Plan:  Aortic atherosclerosis Assessment & Plan: denies chest pain, HTN, HLD, BG well controlled   Essential (primary) hypertension Assessment & Plan: Chronic. Well controlled. Followed by cardiology. Continue Diltiazem  180mg  daily, Losartan  100mg  daily, hctz 12.5mg  daily, Lasix  PRN. Recommend heart healthy diet such as Mediterranean diet with whole grains, fruits, vegetable, fish, lean meats, nuts, and olive oil. Limit salt. Encouraged moderate walking, 3-5 times/week for 30-50 minutes each session. Aim for at least 150 minutes.week. Goal should be pace of 3 miles/hours, or walking 1.5 miles in 30 minutes. Avoid tobacco products. Avoid excess alcohol. Take medications as prescribed and bring medications and blood pressure log with cuff to each office visit. Seek medical care for chest pain, palpitations, shortness of breath with exertion, dizziness/lightheadedness, vision  changes, recurrent headaches, or swelling of extremities. Follow up in 6 months.   OSA on CPAP Assessment & Plan: On CPAP, consider Zepbound   Gastroesophageal reflux disease without esophagitis Assessment & Plan: Protonix  40mg  BID, famotidine  40 nightly PRN, Baclofen  10mg  nightly, well controlled, gastritis, benign polyps, 1cm hiatal hernia on EGD   Benign prostatic hyperplasia with incomplete bladder emptying Assessment & Plan: Well controlled on Tamsulosin  0.4mg  daily   Mixed hyperlipidemia Assessment & Plan: Followed by cardiology, continue fenofibrate  48 mg daily, Nexlizent 180/10mg  daily   I recommend consuming a heart healthy diet such as Mediterranean diet or DASH diet with whole grains, fruits, vegetable, fish, lean meats, nuts, and olive oil. Limit sweets and processed foods. I also encourage moderate intensity exercise 150 minutes weekly. This is 3-5 times weekly for 30-50 minutes each session. Goal should be pace of 3 miles/hours, or walking 1.5 miles in 30 minutes. The 10-year ASCVD risk score (Arnett DK, et al., 2019) is: 35.7%    Prediabetes Assessment & Plan: Add on A1c      Follow up plan: Return in about 3 months (around 06/07/2024) for chronic follow-up with labs 1 week prior.  Jeoffrey GORMAN Barrio, FNP

## 2024-03-08 ENCOUNTER — Other Ambulatory Visit: Payer: Self-pay

## 2024-03-08 DIAGNOSIS — Z79899 Other long term (current) drug therapy: Secondary | ICD-10-CM

## 2024-03-08 NOTE — Progress Notes (Signed)
 BMP order placed per last office notes.  BMP in one week to check renal function and electrolytes

## 2024-03-09 ENCOUNTER — Ambulatory Visit: Payer: Self-pay | Admitting: Cardiology

## 2024-03-09 LAB — TEST AUTHORIZATION

## 2024-03-09 LAB — LIPID PANEL
Cholesterol: 141 mg/dL (ref <200)
HDL: 35 mg/dL — ABNORMAL LOW (ref >=40)
LDL Cholesterol (Calc): 75 mg/dL
Non-HDL Cholesterol (Calc): 106 mg/dL (ref <130)
Total CHOL/HDL Ratio: 4 (calc) (ref <5.0)
Triglycerides: 222 mg/dL — ABNORMAL HIGH (ref <150)

## 2024-03-09 LAB — COMPREHENSIVE METABOLIC PANEL WITH GFR
AG Ratio: 1.7 (calc) (ref 1.0–2.5)
ALT: 14 U/L (ref 9–46)
AST: 20 U/L (ref 10–35)
Albumin: 4.3 g/dL (ref 3.6–5.1)
Alkaline phosphatase (APISO): 35 U/L (ref 35–144)
BUN/Creatinine Ratio: 10 (calc) (ref 6–22)
BUN: 14 mg/dL (ref 7–25)
CO2: 32 mmol/L (ref 20–32)
Calcium: 9.3 mg/dL (ref 8.6–10.3)
Chloride: 98 mmol/L (ref 98–110)
Creat: 1.39 mg/dL — ABNORMAL HIGH (ref 0.70–1.28)
Globulin: 2.5 g/dL (ref 1.9–3.7)
Glucose, Bld: 100 mg/dL — ABNORMAL HIGH (ref 65–99)
Potassium: 4.4 mmol/L (ref 3.5–5.3)
Sodium: 137 mmol/L (ref 135–146)
Total Bilirubin: 0.7 mg/dL (ref 0.2–1.2)
Total Protein: 6.8 g/dL (ref 6.1–8.1)
eGFR: 52 mL/min/1.73m2 — ABNORMAL LOW (ref >=60)

## 2024-03-09 LAB — CBC WITH DIFFERENTIAL/PLATELET
Absolute Lymphocytes: 1716 {cells}/uL (ref 850–3900)
Absolute Monocytes: 457 {cells}/uL (ref 200–950)
Basophils Absolute: 61 {cells}/uL (ref 0–200)
Basophils Relative: 1.1 %
Eosinophils Absolute: 627 {cells}/uL — ABNORMAL HIGH (ref 15–500)
Eosinophils Relative: 11.4 %
HCT: 37.3 % — ABNORMAL LOW (ref 38.5–50.0)
Hemoglobin: 12.2 g/dL — ABNORMAL LOW (ref 13.2–17.1)
MCH: 27.7 pg (ref 27.0–33.0)
MCHC: 32.7 g/dL (ref 32.0–36.0)
MCV: 84.6 fL (ref 80.0–100.0)
MPV: 9.5 fL (ref 7.5–12.5)
Monocytes Relative: 8.3 %
Neutro Abs: 2640 {cells}/uL (ref 1500–7800)
Neutrophils Relative %: 48 %
Platelets: 311 Thousand/uL (ref 140–400)
RBC: 4.41 Million/uL (ref 4.20–5.80)
RDW: 12.4 % (ref 11.0–15.0)
Total Lymphocyte: 31.2 %
WBC: 5.5 Thousand/uL (ref 3.8–10.8)

## 2024-03-09 LAB — BASIC METABOLIC PANEL WITH GFR
BUN/Creatinine Ratio: 14 (ref 10–24)
BUN: 18 mg/dL (ref 8–27)
CO2: 26 mmol/L (ref 20–29)
Calcium: 9.6 mg/dL (ref 8.6–10.2)
Chloride: 98 mmol/L (ref 96–106)
Creatinine, Ser: 1.32 mg/dL — ABNORMAL HIGH (ref 0.76–1.27)
Glucose: 97 mg/dL (ref 70–99)
Potassium: 4.3 mmol/L (ref 3.5–5.2)
Sodium: 139 mmol/L (ref 134–144)
eGFR: 56 mL/min/1.73 — ABNORMAL LOW (ref >59)

## 2024-03-09 LAB — IRON,TIBC AND FERRITIN PANEL
%SAT: 26 % (ref 20–48)
Ferritin: 36 ng/mL (ref 24–380)
Iron: 118 ug/dL (ref 50–180)
TIBC: 450 ug/dL — ABNORMAL HIGH (ref 250–425)

## 2024-03-09 LAB — HEMOGLOBIN A1C
Hgb A1c MFr Bld: 5.8 % — ABNORMAL HIGH (ref <5.7)
Mean Plasma Glucose: 120 mg/dL
eAG (mmol/L): 6.6 mmol/L

## 2024-03-11 ENCOUNTER — Encounter: Payer: Self-pay | Admitting: Cardiology

## 2024-03-11 ENCOUNTER — Ambulatory Visit: Payer: Self-pay | Admitting: Cardiology

## 2024-03-11 DIAGNOSIS — I493 Ventricular premature depolarization: Secondary | ICD-10-CM | POA: Diagnosis not present

## 2024-03-11 DIAGNOSIS — R001 Bradycardia, unspecified: Secondary | ICD-10-CM

## 2024-03-11 DIAGNOSIS — I10 Essential (primary) hypertension: Secondary | ICD-10-CM

## 2024-03-12 MED ORDER — HYDROCHLOROTHIAZIDE 12.5 MG PO CAPS: 12.5000 mg | ORAL_CAPSULE | Freq: Every day | ORAL | 1 refills | Status: DC

## 2024-05-14 ENCOUNTER — Telehealth: Payer: Self-pay

## 2024-05-14 NOTE — Telephone Encounter (Signed)
 Copied from CRM #[id number]. Topic: Clinical - Request for Lab/Test Order >> May 14, 2024  2:41 PM Rosaria BRAVO wrote: Reason for CRM: Pt called to report that his PCP told him to call for lab work but he is not sure which labs exactly. Please advise when lab orders are placed.   Best contact: [phone]

## 2024-05-23 NOTE — Telephone Encounter (Signed)
 Pt scheduled for labs and for CPE in early May

## 2024-05-28 ENCOUNTER — Other Ambulatory Visit: Payer: Self-pay | Admitting: Family Medicine

## 2024-05-28 ENCOUNTER — Other Ambulatory Visit: Payer: Self-pay | Admitting: Cardiology

## 2024-05-28 DIAGNOSIS — E782 Mixed hyperlipidemia: Secondary | ICD-10-CM

## 2024-05-30 ENCOUNTER — Other Ambulatory Visit: Payer: Self-pay | Admitting: Cardiology

## 2024-05-30 DIAGNOSIS — E781 Pure hyperglyceridemia: Secondary | ICD-10-CM

## 2024-05-30 DIAGNOSIS — I10 Essential (primary) hypertension: Secondary | ICD-10-CM

## 2024-05-30 DIAGNOSIS — E7800 Pure hypercholesterolemia, unspecified: Secondary | ICD-10-CM

## 2024-05-31 NOTE — Telephone Encounter (Signed)
 Pt's medications were sent to pt's pharmacy as requested. Pt has normal labs done on 10/25/2023, with a normal cr level. Confirmation received.

## 2024-09-11 ENCOUNTER — Other Ambulatory Visit

## 2024-09-18 ENCOUNTER — Encounter: Admitting: Family Medicine

## 2024-10-31 ENCOUNTER — Ambulatory Visit
